# Patient Record
Sex: Male | Born: 1950
Health system: Southern US, Community
[De-identification: ages and names within clinical notes are randomized; demographics above are authoritative.]

## PROBLEM LIST (undated history)

## (undated) HISTORY — PX: OTHER SURGICAL HISTORY: SHX169

## (undated) HISTORY — PX: TRANSURETHRAL RESECTION OF PROSTATE: SHX73

---

## 1997-08-10 ENCOUNTER — Encounter: Admission: RE | Admit: 1997-08-10 | Discharge: 1997-11-08 | Payer: Self-pay | Admitting: Family Medicine

## 2000-10-22 ENCOUNTER — Ambulatory Visit (HOSPITAL_COMMUNITY): Admission: RE | Admit: 2000-10-22 | Discharge: 2000-10-22 | Payer: Self-pay | Admitting: Gastroenterology

## 2003-03-04 ENCOUNTER — Ambulatory Visit (HOSPITAL_COMMUNITY): Admission: RE | Admit: 2003-03-04 | Discharge: 2003-03-04 | Payer: Self-pay | Admitting: Internal Medicine

## 2003-03-04 IMAGING — CT CT ABDOMEN W/ CM
1 of 3 series · 12 of 32 positions shown, 18 images · IV contrast (omnipaque)
Comparison: none

CLINICAL DATA: Liver lesion noted on outside ultrasound.

CT ABDOMEN WITH CONTRAST
TECHNIQUE: Multidetector helical imaging carried out through the abdomen and pelvis utilizing oral and IV contrast (150 cc of Omnipaque 300).  No prior studies for comparison.  There is an outside ultrasound from [facility] for correlation.

[Series 3: abd/pelvis 5.0 b30f · axial · 0.86mm/px · z∈[-412,-152]mm · 12 of 62 slices shown, 18 images]
[im 5/62  soft-tissue]
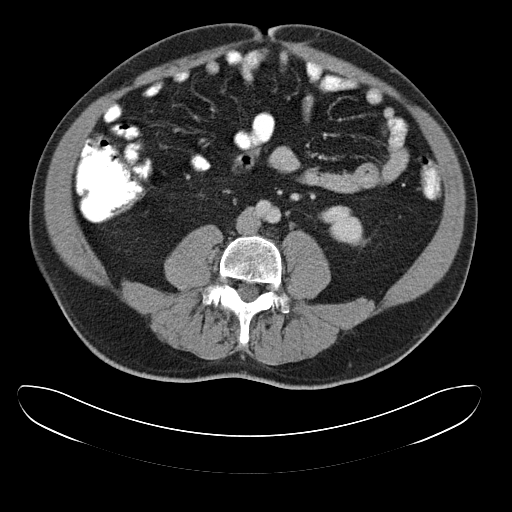
[im 5/62  bone]
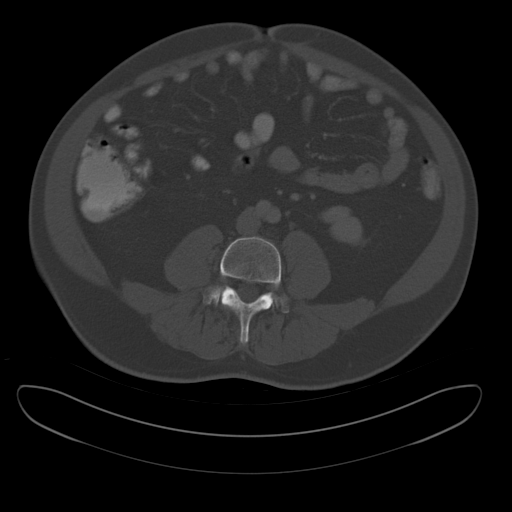
[im 10/62  soft-tissue]
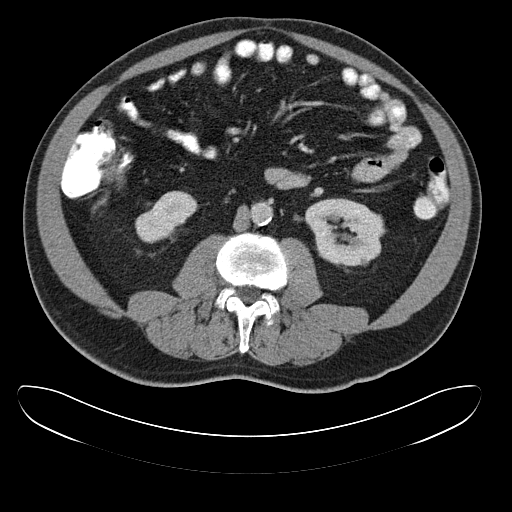
[im 15/62  soft-tissue]
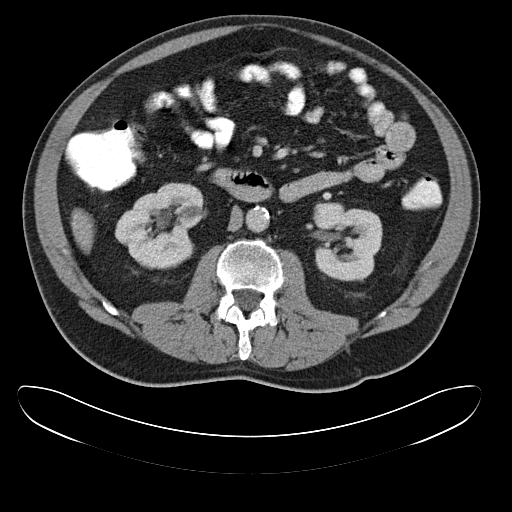
[im 19/62  soft-tissue]
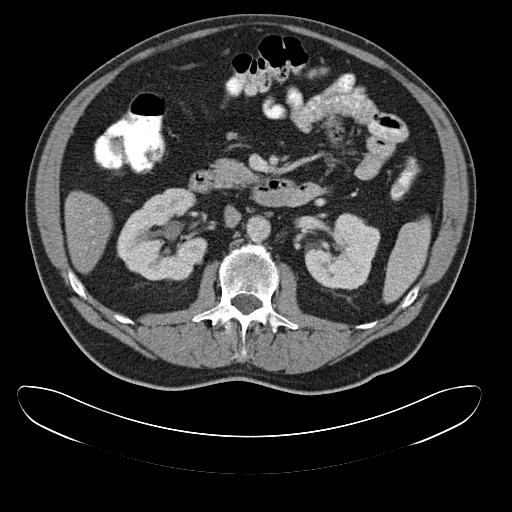
[im 24/62  soft-tissue]
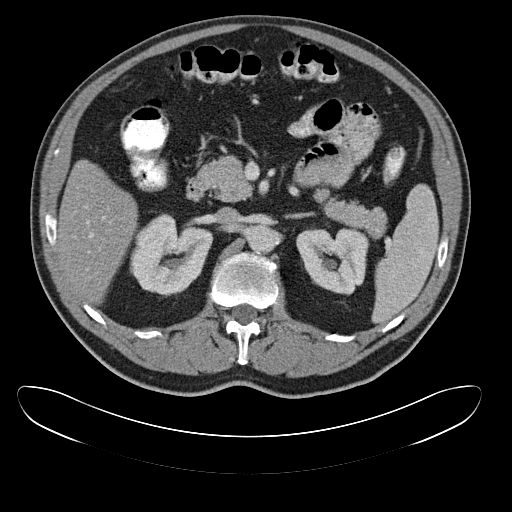
[im 29/62  soft-tissue]
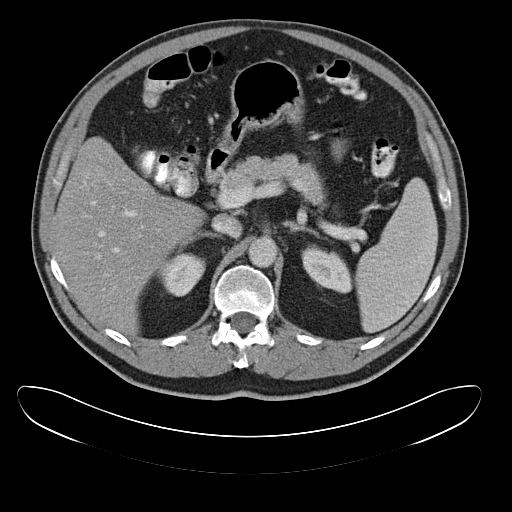
[im 33/62  soft-tissue]
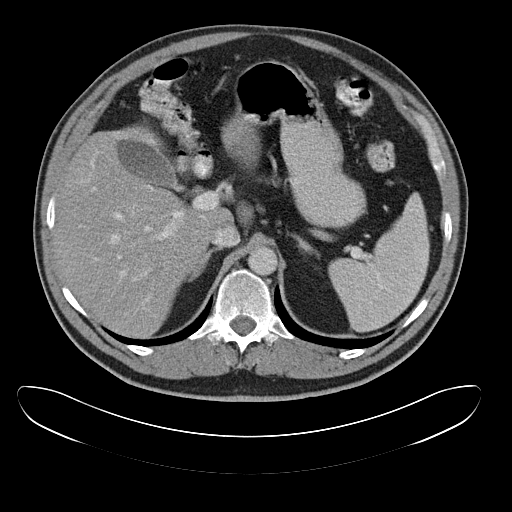
[im 38/62  soft-tissue]
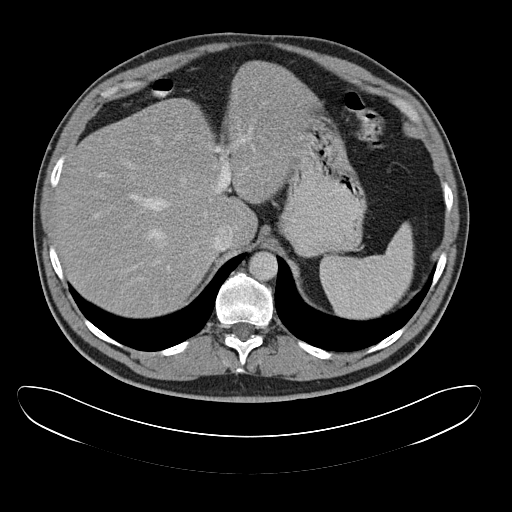
[im 43/62  soft-tissue]
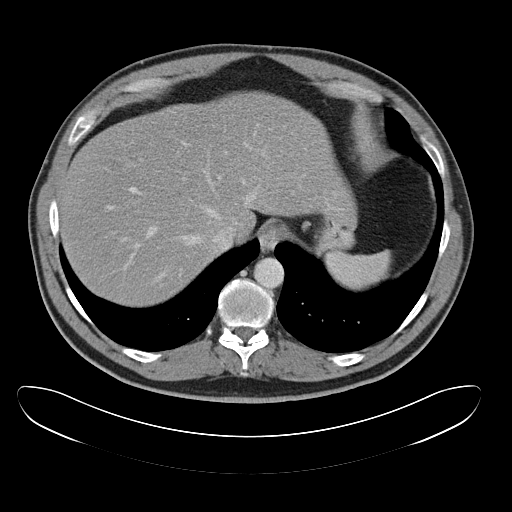
[im 43/62  lung]
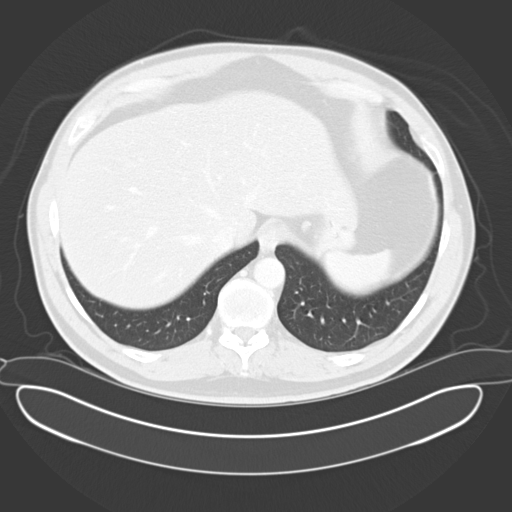
[im 43/62  bone]
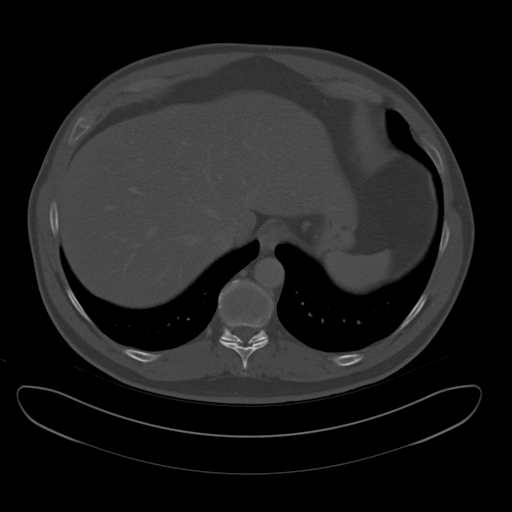
[im 47/62  soft-tissue]
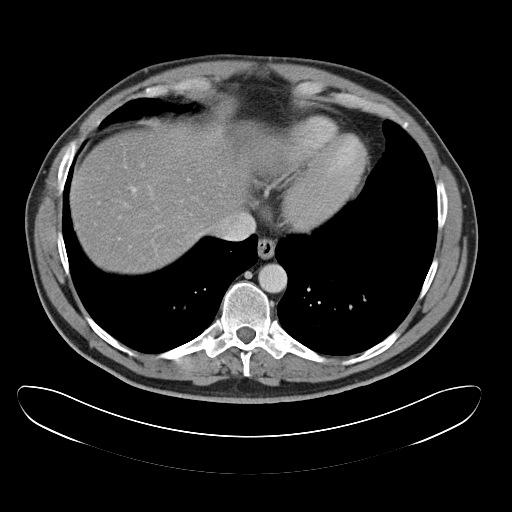
[im 47/62  lung]
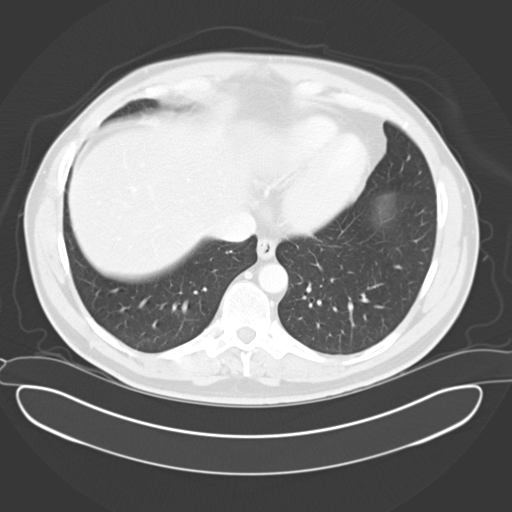
[im 52/62  soft-tissue]
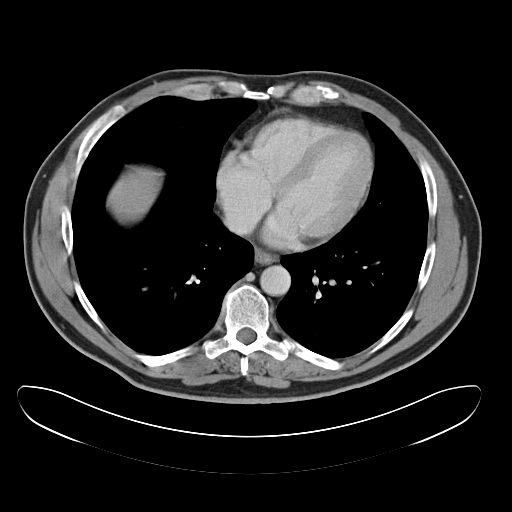
[im 52/62  lung]
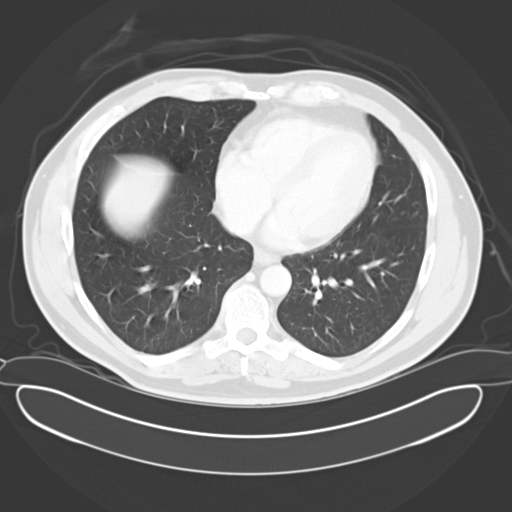
[im 57/62  soft-tissue]
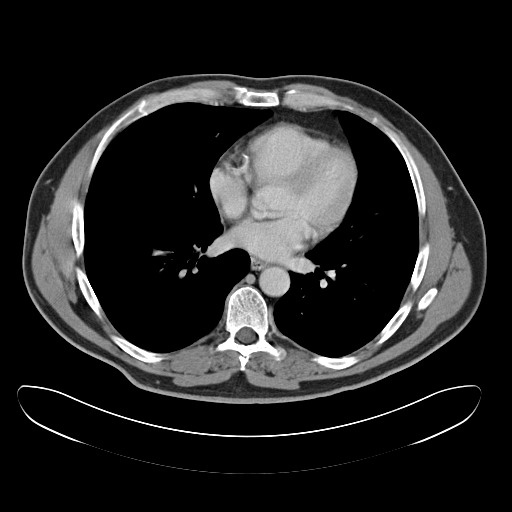
[im 57/62  lung]
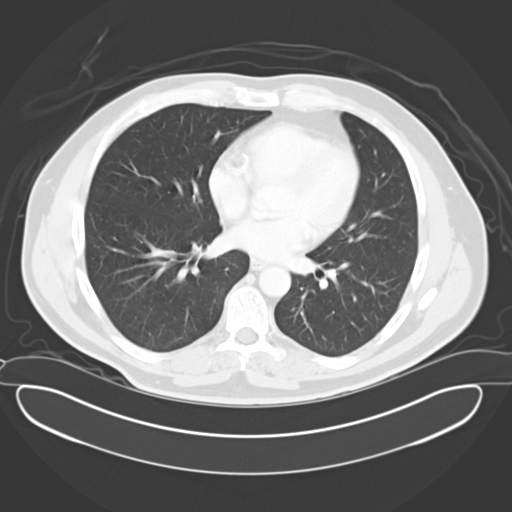

[12 of 32 positions shown; findings below may reference images not displayed]

FINDINGS: The outside ultrasound exam shows what appears to be a hypoechoic lesion in the anterior aspect of the liver.  I see no evidence for a liver lesion on the early or delayed study.  It is possible that the lesion is isointense with liver tissue and cannot be imaged.  This might be the case with a hemangioma that has become isointense.  However, I see no suggestion whatsoever for an abnormality as the ultrasound suggested.  I would therefore question if this lesion was an artifact.  However, the sonographic abnormality cannot be ignored.  One might consider an MRI of the liver for further assessment, if clinically warranted.

The spleen, pancreas, and adrenals are normal.  No calcified gallstones or bile duct dilatation.  There is however, a 6 mm structure in the dependent portion of the tip of the gallbladder that is probably a polyp.  The other possibility is that it is a faintly calcified gallstone.  This is seen on both the early and delayed views so it is a true abnormality.  It was also noted on the outside ultrasound and was thought to represent a polyp.

Early and delayed images of the kidneys are unremarkable except for a small simple cyst of the right mid kidney.  no retroperitoneal adenopathy, ascites, or focal masses.

IMPRESSION
There is no visible abnormality in the anterior aspect of the liver as the outside ultrasound suggests.  See above discussion.
There is a 6 mm faintly calcified or enhancing lesion in the tip of the gallbladder consistent with a polyp or faintly calcified stone.

## 2003-06-02 ENCOUNTER — Encounter
Admission: RE | Admit: 2003-06-02 | Discharge: 2003-06-02 | Payer: Self-pay | Admitting: Physical Medicine and Rehabilitation

## 2003-06-02 IMAGING — CT CT L SPINE W/ CM
2 of 9 series · 11 of 33 positions shown, 14 images · non-contrast
Comparison: none

CLINICAL DATA: Low back pain.  Numbness in left foot.  Occasional back pain.
TECHNIQUE: 1.25 mm collimated images were obtained in the axial plane followed by coronal, sagittal and oblique axial reformatted images through the examined individual disk spaces.  
L1-2:  Normal interspace.
L2-3:  Tiny calcified central protrusion.  No root cut off or spinal stenosis.
L3-4:  Broad-based central disk protrusion which is calcified on the right.  Right L4 nerve root encroachment is seen due to lateral recess stenosis.
L4-5:  Mild facet arthropathy.  Broad-based disk protrusion.  No definite L4 or L5 nerve root encroachment can be seen.  
L5-S1:  Small central protrusion.  No definite root cut off or spinal stenosis. 
IMPRESSION
1.  Broad-based disk protrusion L3-4 central and to the right with right L4 nerve root encroachment.
2.  Small protrusions L4-5 and L5-S1 without definite L5 or S1 nerve root compromise.
3.  Small central protrusion L2-3, left without L3 nerve root compression.
CT MULTIPLANAR RECONSTRUCTION
Multiplanar reformatted CT images were reconstructed from the axial CT data set. These images were reviewed and pertinent findings are included in the accompanying complete CT report. 

See complete CT report.

[Series 3: recon 2: l-spine helical · axial · 0.27mm/px · z∈[-35,+115]mm · 6 of 351 slices shown, 8 images]
[im 51/351  soft-tissue]
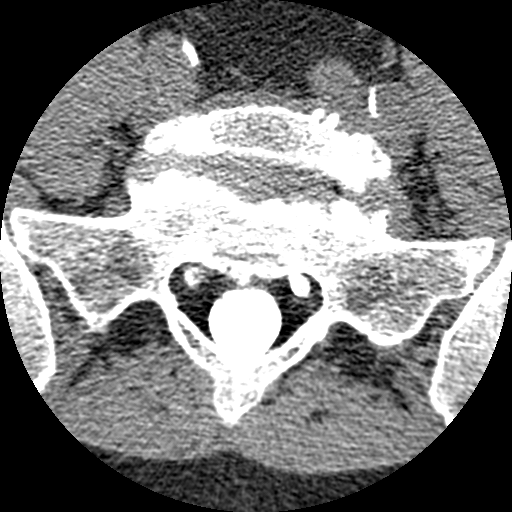
[im 51/351  bone]
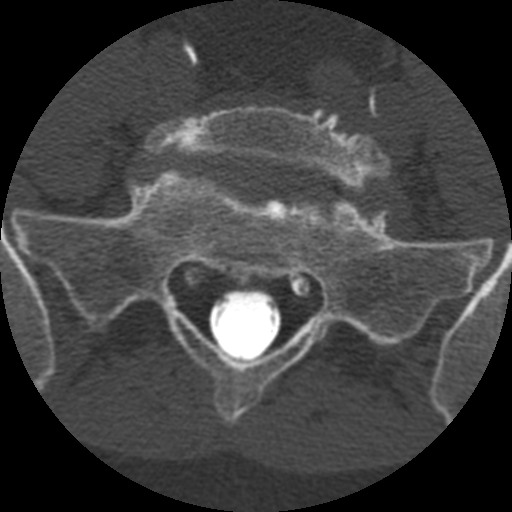
[im 101/351  bone]
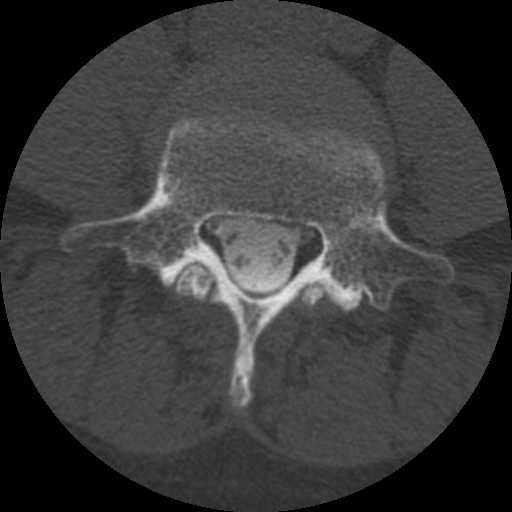
[im 151/351  bone]
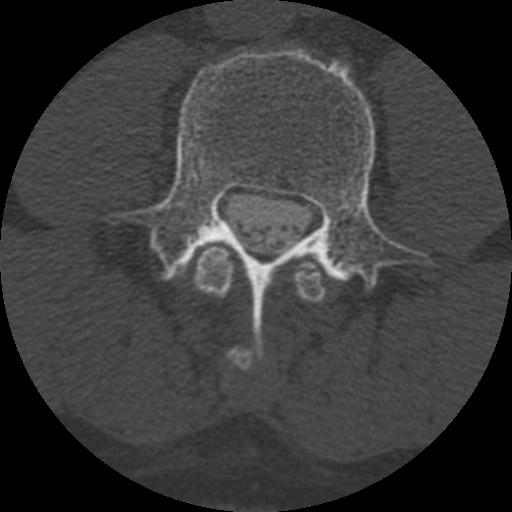
[im 201/351  bone]
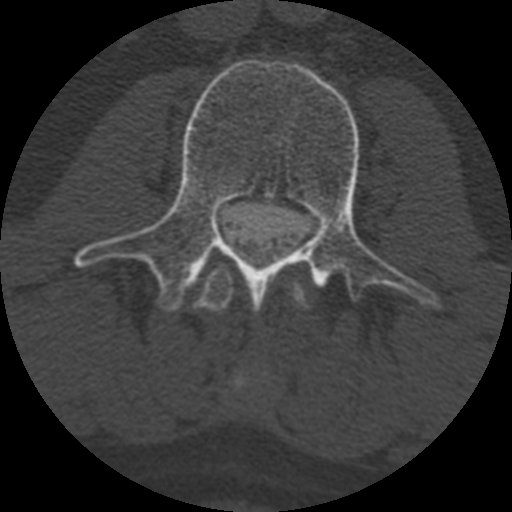
[im 251/351  soft-tissue]
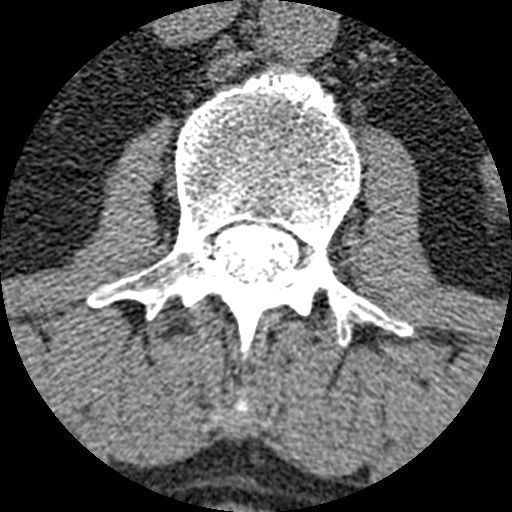
[im 251/351  bone]
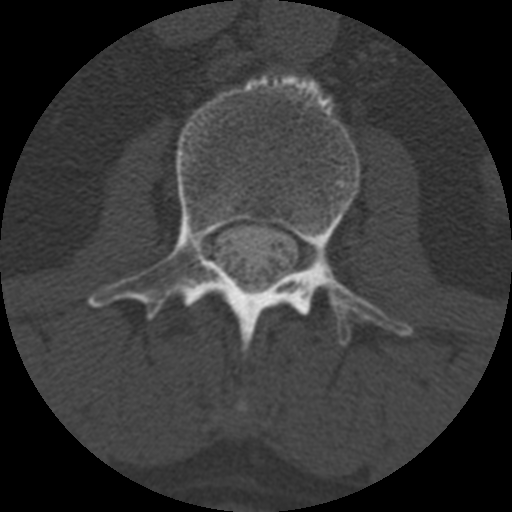
[im 301/351  bone]
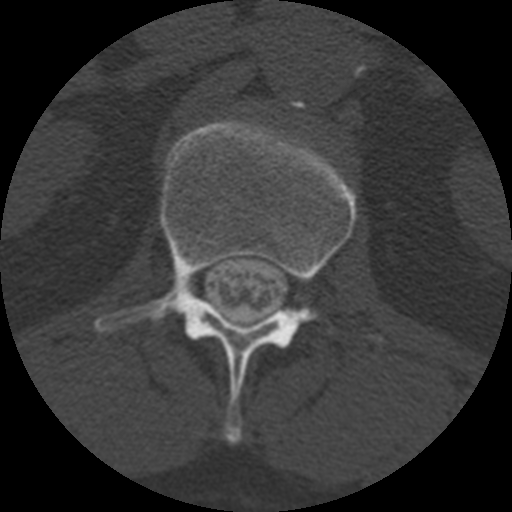

[Series 608: reformatted · coronal · 0.42mm/px · 5 of 40 slices shown, 6 images]
[im 14/40  bone]
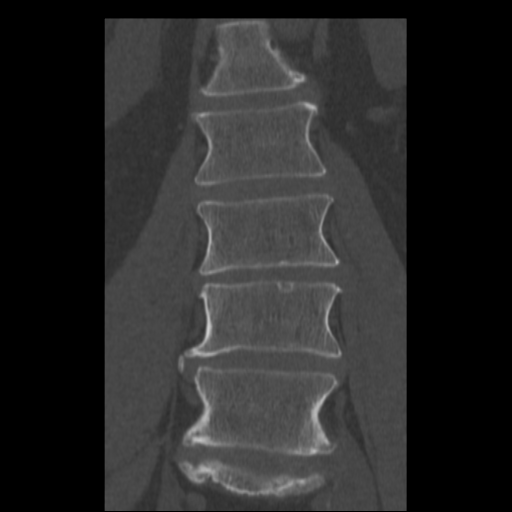
[im 17/40  bone]
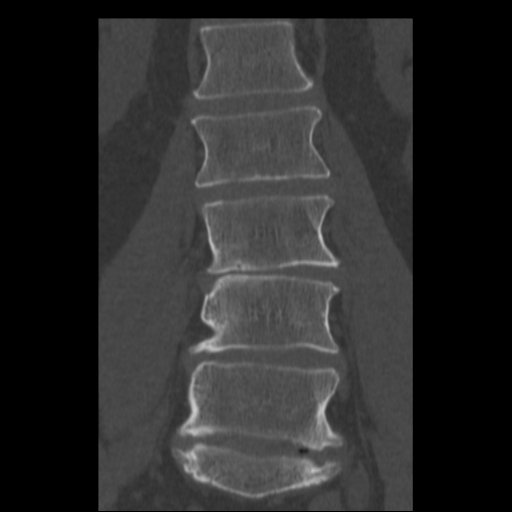
[im 20/40  soft-tissue]
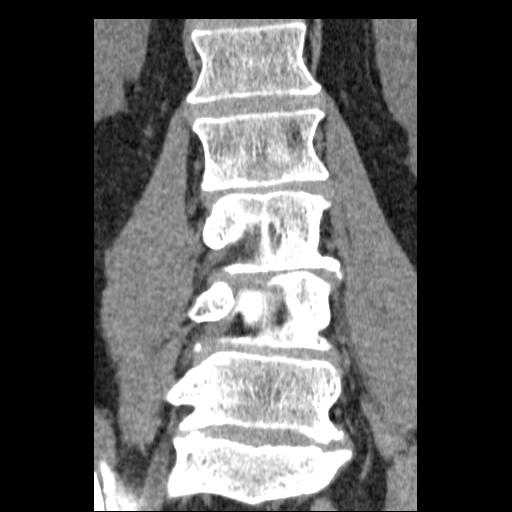
[im 20/40  bone]
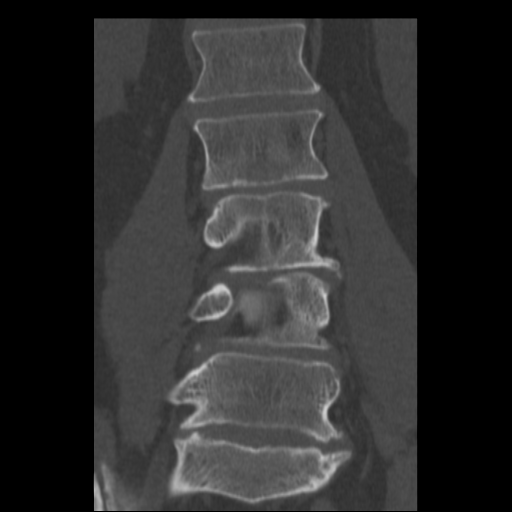
[im 23/40  bone]
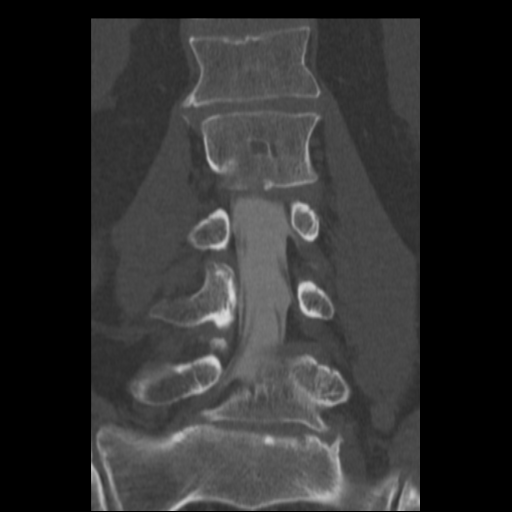
[im 27/40  bone]
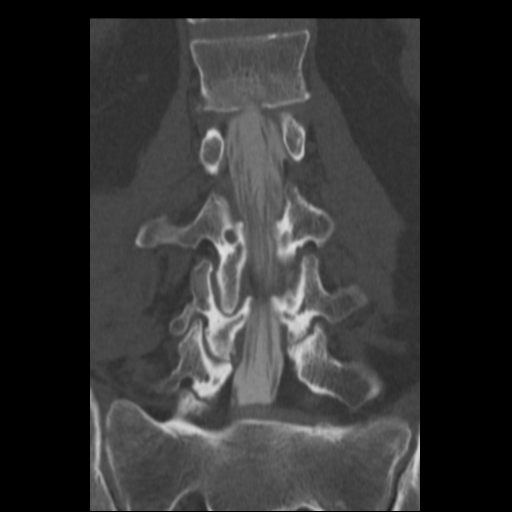

[11 of 33 positions shown; findings below may reference images not displayed]

LUMBAR MYELOGRAM 
The patient was given extensive informed consent including the risk of pain, infection, bleeding, and headache.  A lumbar puncture was performed at L2-3 using a 22 gauge spinal needle from a left paramedian approach.  Fluid was clear and colorless.  15 cc of Omnipaque 180 was instilled into the subarachnoid space.  AP, lateral, and oblique views demonstrate mild disk space narrowing at L4-5.  Minimal retrolisthesis of L3 on L4 is seen of 2 to 3 mm.  Early osteophyte formation is seen inferiorly at L3.  Flexion/extension show good range of motion without abnormal movement.  Small ventral defects are seen at L3-4, L4-5, and L5-S1.  
IMPRESSION
As above. 
POST MYELOGRAM CT OF THE LUMBAR SPINE WITH CONTRAST

## 2008-04-26 ENCOUNTER — Ambulatory Visit (HOSPITAL_COMMUNITY): Admission: RE | Admit: 2008-04-26 | Discharge: 2008-04-26 | Payer: Self-pay | Admitting: Family Medicine

## 2008-04-26 IMAGING — CT CT ABDOMEN W/O CM
2 of 4 series · 17 of 46 positions shown, 19 images · non-contrast
Comparison: Abdomen CT on [DATE]

CT ABDOMEN

CLINICAL DATA: Left-sided flank and back pain.  Hematuria.

CT ABDOMEN AND PELVIS WITHOUT CONTRAST
TECHNIQUE: Multidetector CT imaging of the abdomen and pelvis was
performed following the standard protocol without intravenous
contrast.

[Series 2: stone w/o 5.0 b40f · axial · non-contrast · 0.84mm/px · z∈[-536,-76]mm · 14 of 126 slices shown, 16 images]
[im 6/126  soft-tissue]
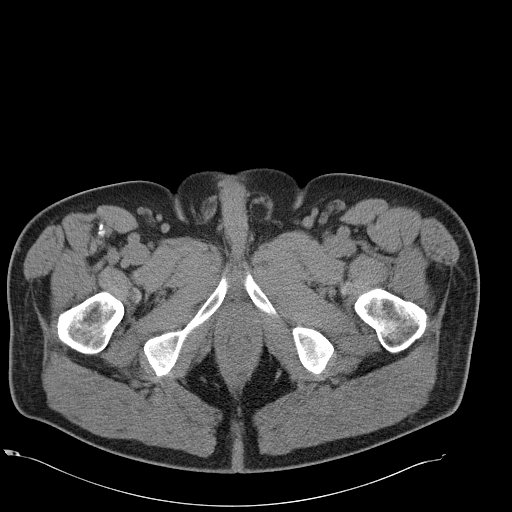
[im 6/126  bone]
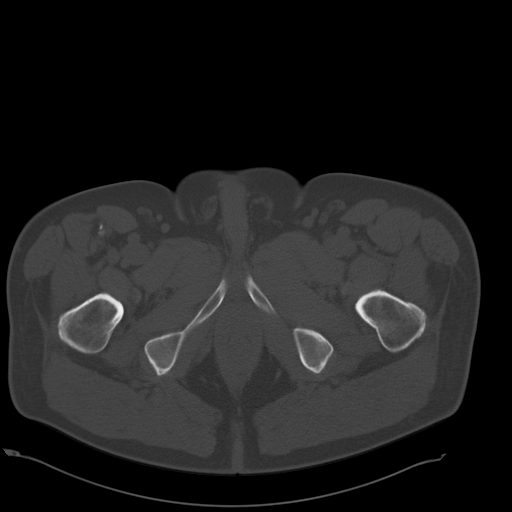
[im 16/126  soft-tissue]
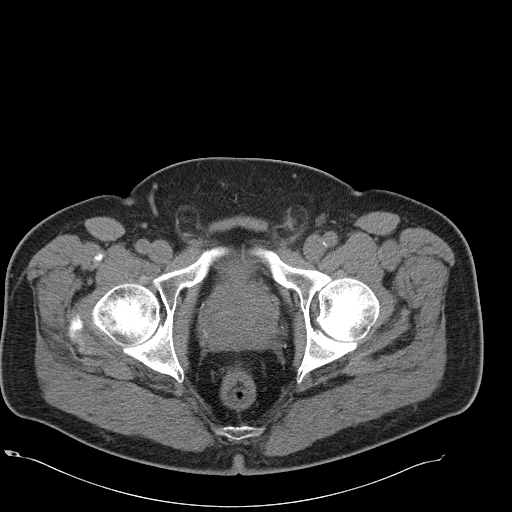
[im 26/126  soft-tissue]
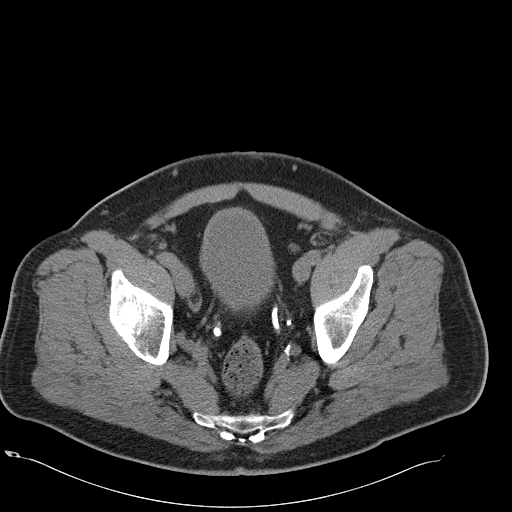
[im 36/126  soft-tissue]
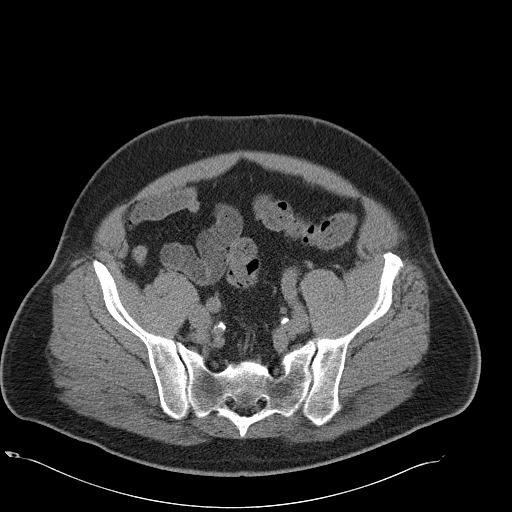
[im 41/126  soft-tissue]
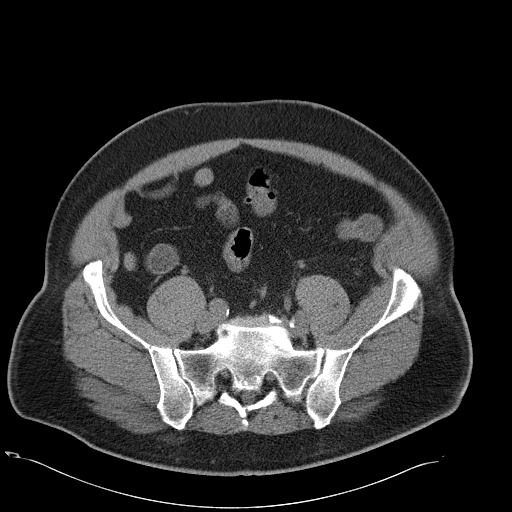
[im 51/126  soft-tissue]
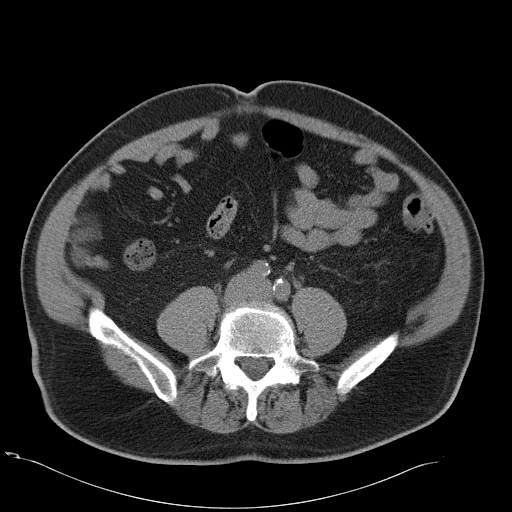
[im 61/126  soft-tissue]
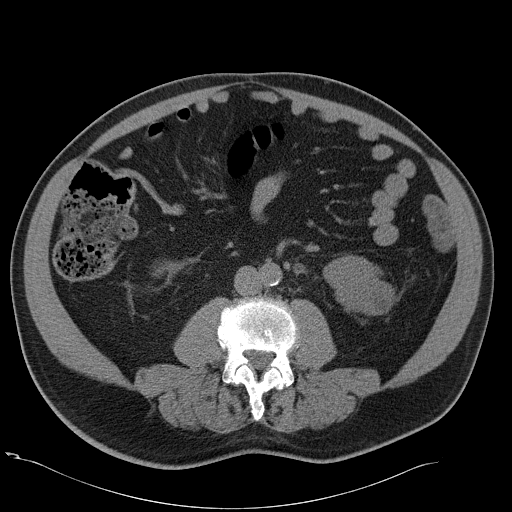
[im 66/126  soft-tissue]
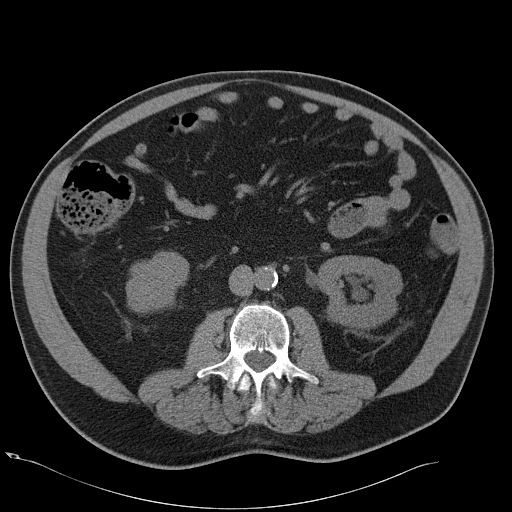
[im 76/126  soft-tissue]
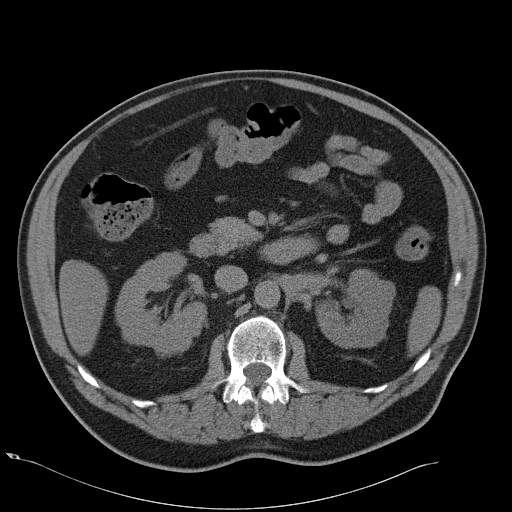
[im 76/126  bone]
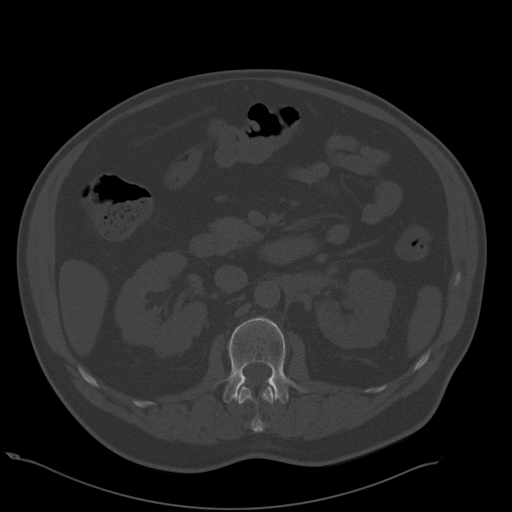
[im 86/126  soft-tissue]
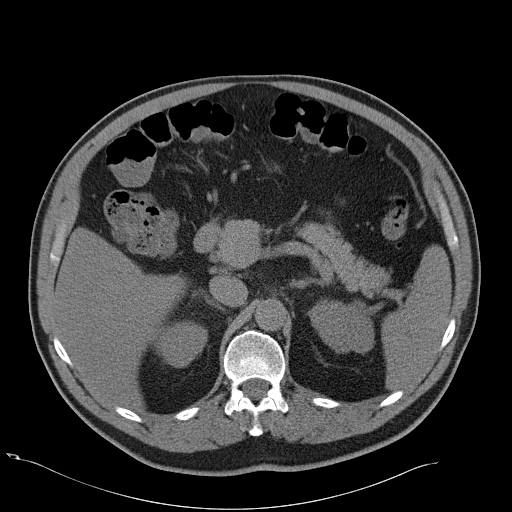
[im 96/126  soft-tissue]
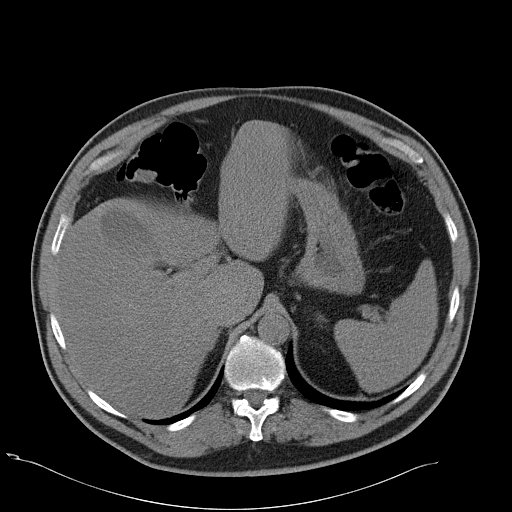
[im 101/126  soft-tissue]
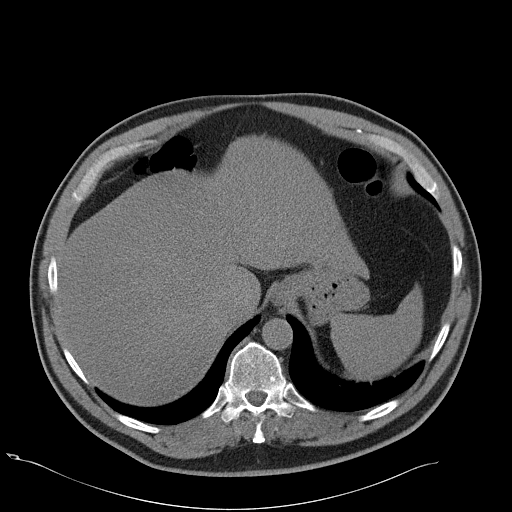
[im 111/126  soft-tissue]
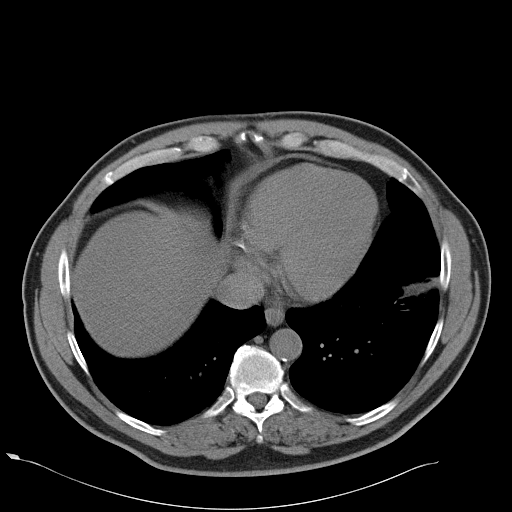
[im 121/126  soft-tissue]
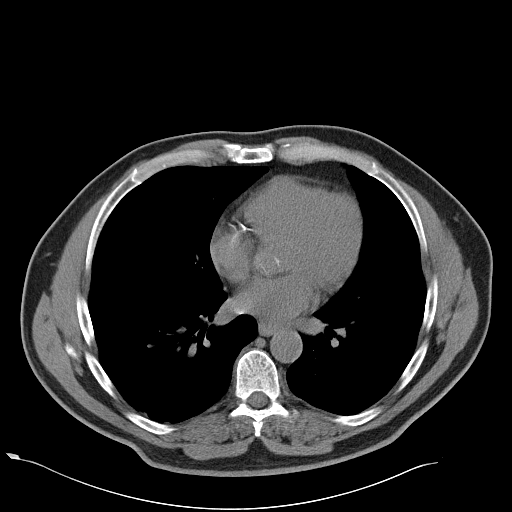

[Series 602: coronal abdomen · coronal · 0.98mm/px · 3 of 157 slices shown]
[im 53/157  soft-tissue]
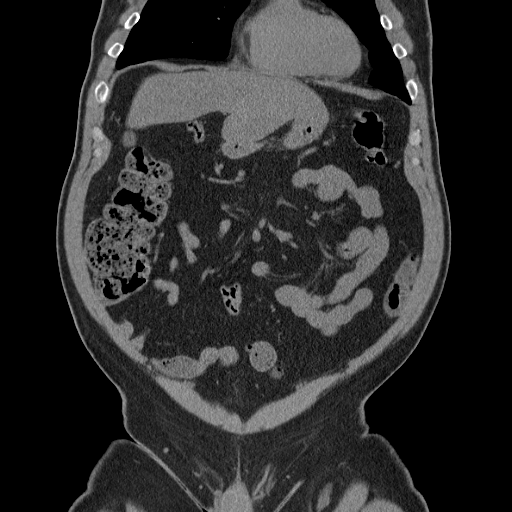
[im 70/157  soft-tissue]
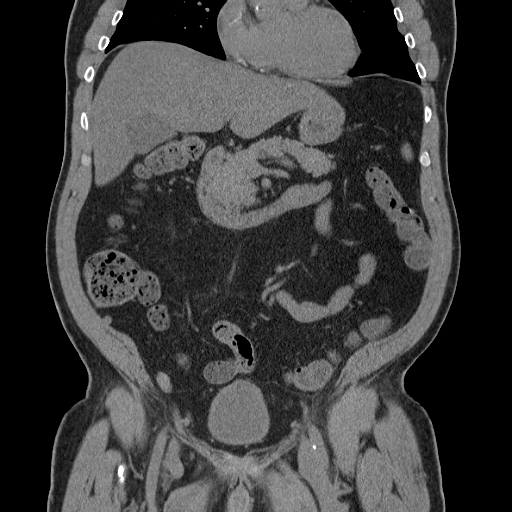
[im 87/157  soft-tissue]
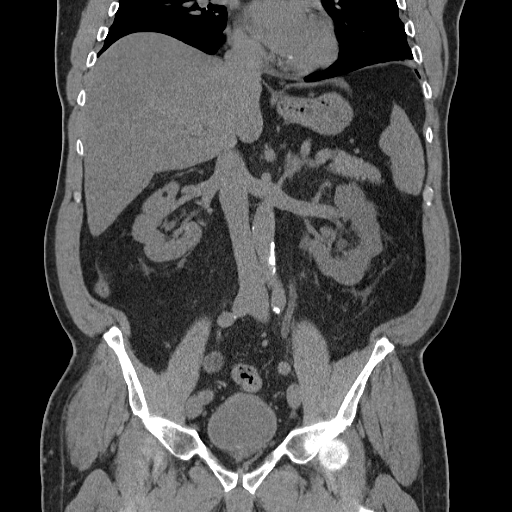

[17 of 46 positions shown; findings below may reference images not displayed]

FINDINGS: Mild left hydronephrosis and ureterectasis is seen.
There is no evidence of right-sided hydronephrosis.  Lobulated
contours of both kidneys is again seen without parenchymal
thinning.  This may be due to persistent fetal lobation, or less
likely scarring.

Diffuse fatty infiltration of liver is again demonstrated.  A high
attenuation lesion is seen in the peripheral right hepatic lobe on
image number 35 which is stable since previous study and [UC] is
consistent with a benign lesion.  Small gallstones are again noted
however there is no evidence of acute cholecystitis.

Noncontrast images of the spleen, pancreas, and adrenal glands are
normal in appearance.  There is no evidence of inflammatory process
or dilated bowel loops.
IMPRESSION: 1.  Mild left hydronephrosis and ureterectasis due to distal left
ureteral calculus.  See pelvis CT report below.
2. Stable diffuse fatty infiltration of liver and benign right
hepatic lobe lesion unchanged since [DATE].  Cholelithiasis.

CT PELVIS
FINDINGS: Mild left ureteral dilatation is seen.  A distal left
ureteral calculus measuring 3 mm is located approximates 2 cm from
the ureterovesicle junction.

Mild to moderate enlargement prostate gland is noted.  No other
pelvic soft tissue masses are identified.  There is no evidence of
pelvic lymphadenopathy.  There is no evidence of inflammatory
process or abnormal fluid collections.
IMPRESSION: 1.  3 mm distal left ureteral calculus.
2.  Enlarged prostate.

## 2010-05-21 ENCOUNTER — Encounter: Payer: Self-pay | Admitting: Neurosurgery

## 2010-09-16 NOTE — Procedures (Signed)
[facility]  Patient:    Miguel [name], Miguel [name]                        MRN: [id number] Proc. Date: 10/22/00 Attending:  Everardo All. Madilyn Fireman, M.D. CC:         Chales Salmon. Abigail Miyamoto, M.D.   Procedure Report  PROCEDURE:  Colonoscopy.  INDICATION FOR PROCEDURE:  Intermittent rectal bleeding in a 60 year old patient with no prior colon screening.  DESCRIPTION OF PROCEDURE:  The patient was placed in the left lateral decubitus position and placed on the pulse monitor with continuous low-flow oxygen delivered by nasal cannula.  He was sedated with 80 mg IV Demerol and 8 mg IV Versed.  The Olympus video colonoscope was inserted into the rectum and advanced to the cecum, confirmed by transillumination at McBurneys point and visualization of the ileocecal valve and appendiceal orifice.  The prep was fairly good but suboptimal in some areas, and I could not rule out small lesions less than 1 cm in all areas.  Otherwise, the cecum, ascending, transverse, descending, and sigmoid colon all appeared normal with no masses, polyps, diverticula, or other mucosal abnormalities.  The rectum likewise appeared normal, and retroflex view of the anus revealed some small internal hemorrhoids.  The colonoscope was then withdrawn and the patient returned to the recovery room in stable condition.  He tolerated the procedure well, and there were no immediate complications.  IMPRESSION:  Internal hemorrhoids, otherwise normal colonoscopy.  PLAN:  Symptomatic treatment of hemorrhoids.  Recommend repeat flexible sigmoidoscopy in five years. DD:  10/22/00 TD:  10/22/00 Job: [id number]

## 2015-07-19 DIAGNOSIS — M75111 Incomplete rotator cuff tear or rupture of right shoulder, not specified as traumatic: Secondary | ICD-10-CM | POA: Diagnosis not present

## 2015-08-20 DIAGNOSIS — M13862 Other specified arthritis, left knee: Secondary | ICD-10-CM | POA: Diagnosis not present

## 2015-09-07 DIAGNOSIS — M25662 Stiffness of left knee, not elsewhere classified: Secondary | ICD-10-CM | POA: Diagnosis not present

## 2015-09-07 DIAGNOSIS — M6281 Muscle weakness (generalized): Secondary | ICD-10-CM | POA: Diagnosis not present

## 2015-09-07 DIAGNOSIS — M1712 Unilateral primary osteoarthritis, left knee: Secondary | ICD-10-CM | POA: Diagnosis not present

## 2015-09-10 DIAGNOSIS — N521 Erectile dysfunction due to diseases classified elsewhere: Secondary | ICD-10-CM | POA: Diagnosis not present

## 2015-09-10 DIAGNOSIS — E291 Testicular hypofunction: Secondary | ICD-10-CM | POA: Diagnosis not present

## 2015-09-10 DIAGNOSIS — N390 Urinary tract infection, site not specified: Secondary | ICD-10-CM | POA: Diagnosis not present

## 2015-09-10 DIAGNOSIS — N401 Enlarged prostate with lower urinary tract symptoms: Secondary | ICD-10-CM | POA: Diagnosis not present

## 2015-09-13 DIAGNOSIS — M25662 Stiffness of left knee, not elsewhere classified: Secondary | ICD-10-CM | POA: Diagnosis not present

## 2015-09-13 DIAGNOSIS — M6281 Muscle weakness (generalized): Secondary | ICD-10-CM | POA: Diagnosis not present

## 2015-09-13 DIAGNOSIS — M1712 Unilateral primary osteoarthritis, left knee: Secondary | ICD-10-CM | POA: Diagnosis not present

## 2015-09-17 DIAGNOSIS — M13862 Other specified arthritis, left knee: Secondary | ICD-10-CM | POA: Diagnosis not present

## 2015-09-22 DIAGNOSIS — M1712 Unilateral primary osteoarthritis, left knee: Secondary | ICD-10-CM | POA: Diagnosis not present

## 2015-09-22 DIAGNOSIS — M6281 Muscle weakness (generalized): Secondary | ICD-10-CM | POA: Diagnosis not present

## 2015-09-22 DIAGNOSIS — M25662 Stiffness of left knee, not elsewhere classified: Secondary | ICD-10-CM | POA: Diagnosis not present

## 2015-10-04 DIAGNOSIS — X58XXXA Exposure to other specified factors, initial encounter: Secondary | ICD-10-CM | POA: Diagnosis not present

## 2015-10-04 DIAGNOSIS — S63502A Unspecified sprain of left wrist, initial encounter: Secondary | ICD-10-CM | POA: Diagnosis not present

## 2015-10-12 DIAGNOSIS — M6281 Muscle weakness (generalized): Secondary | ICD-10-CM | POA: Diagnosis not present

## 2015-10-12 DIAGNOSIS — M1712 Unilateral primary osteoarthritis, left knee: Secondary | ICD-10-CM | POA: Diagnosis not present

## 2015-10-12 DIAGNOSIS — M25662 Stiffness of left knee, not elsewhere classified: Secondary | ICD-10-CM | POA: Diagnosis not present

## 2015-10-20 DIAGNOSIS — M1712 Unilateral primary osteoarthritis, left knee: Secondary | ICD-10-CM | POA: Diagnosis not present

## 2015-10-20 DIAGNOSIS — M6281 Muscle weakness (generalized): Secondary | ICD-10-CM | POA: Diagnosis not present

## 2015-10-20 DIAGNOSIS — M25662 Stiffness of left knee, not elsewhere classified: Secondary | ICD-10-CM | POA: Diagnosis not present

## 2015-10-22 DIAGNOSIS — M25532 Pain in left wrist: Secondary | ICD-10-CM | POA: Diagnosis not present

## 2015-10-22 DIAGNOSIS — M13862 Other specified arthritis, left knee: Secondary | ICD-10-CM | POA: Diagnosis not present

## 2016-01-05 DIAGNOSIS — M25511 Pain in right shoulder: Secondary | ICD-10-CM | POA: Diagnosis not present

## 2016-01-05 DIAGNOSIS — B351 Tinea unguium: Secondary | ICD-10-CM | POA: Diagnosis not present

## 2016-02-04 DIAGNOSIS — M170 Bilateral primary osteoarthritis of knee: Secondary | ICD-10-CM | POA: Diagnosis not present

## 2016-02-04 DIAGNOSIS — N400 Enlarged prostate without lower urinary tract symptoms: Secondary | ICD-10-CM | POA: Diagnosis not present

## 2016-02-04 DIAGNOSIS — M109 Gout, unspecified: Secondary | ICD-10-CM | POA: Diagnosis not present

## 2016-02-04 DIAGNOSIS — I10 Essential (primary) hypertension: Secondary | ICD-10-CM | POA: Diagnosis not present

## 2016-02-04 DIAGNOSIS — E7800 Pure hypercholesterolemia, unspecified: Secondary | ICD-10-CM | POA: Diagnosis not present

## 2016-02-04 DIAGNOSIS — G629 Polyneuropathy, unspecified: Secondary | ICD-10-CM | POA: Diagnosis not present

## 2016-02-07 DIAGNOSIS — M1712 Unilateral primary osteoarthritis, left knee: Secondary | ICD-10-CM | POA: Diagnosis not present

## 2016-02-07 DIAGNOSIS — M170 Bilateral primary osteoarthritis of knee: Secondary | ICD-10-CM | POA: Diagnosis not present

## 2016-02-07 DIAGNOSIS — M25561 Pain in right knee: Secondary | ICD-10-CM | POA: Diagnosis not present

## 2016-02-07 DIAGNOSIS — M25562 Pain in left knee: Secondary | ICD-10-CM | POA: Diagnosis not present

## 2016-02-14 DIAGNOSIS — M1712 Unilateral primary osteoarthritis, left knee: Secondary | ICD-10-CM | POA: Diagnosis not present

## 2016-02-14 DIAGNOSIS — M170 Bilateral primary osteoarthritis of knee: Secondary | ICD-10-CM | POA: Diagnosis not present

## 2016-02-14 DIAGNOSIS — M25562 Pain in left knee: Secondary | ICD-10-CM | POA: Diagnosis not present

## 2016-02-22 DIAGNOSIS — M1712 Unilateral primary osteoarthritis, left knee: Secondary | ICD-10-CM | POA: Diagnosis not present

## 2016-02-22 DIAGNOSIS — M25562 Pain in left knee: Secondary | ICD-10-CM | POA: Diagnosis not present

## 2016-02-29 DIAGNOSIS — M1712 Unilateral primary osteoarthritis, left knee: Secondary | ICD-10-CM | POA: Diagnosis not present

## 2016-02-29 DIAGNOSIS — M25562 Pain in left knee: Secondary | ICD-10-CM | POA: Diagnosis not present

## 2016-03-07 DIAGNOSIS — M1712 Unilateral primary osteoarthritis, left knee: Secondary | ICD-10-CM | POA: Diagnosis not present

## 2016-03-07 DIAGNOSIS — M25562 Pain in left knee: Secondary | ICD-10-CM | POA: Diagnosis not present

## 2016-04-04 DIAGNOSIS — M1712 Unilateral primary osteoarthritis, left knee: Secondary | ICD-10-CM | POA: Diagnosis not present

## 2016-04-04 DIAGNOSIS — M25562 Pain in left knee: Secondary | ICD-10-CM | POA: Diagnosis not present

## 2016-05-25 DIAGNOSIS — M25562 Pain in left knee: Secondary | ICD-10-CM | POA: Diagnosis not present

## 2016-05-25 DIAGNOSIS — M1712 Unilateral primary osteoarthritis, left knee: Secondary | ICD-10-CM | POA: Diagnosis not present

## 2016-05-30 DIAGNOSIS — I10 Essential (primary) hypertension: Secondary | ICD-10-CM | POA: Diagnosis not present

## 2016-07-25 DIAGNOSIS — Z125 Encounter for screening for malignant neoplasm of prostate: Secondary | ICD-10-CM | POA: Diagnosis not present

## 2016-07-25 DIAGNOSIS — Z0000 Encounter for general adult medical examination without abnormal findings: Secondary | ICD-10-CM | POA: Diagnosis not present

## 2016-07-25 DIAGNOSIS — N400 Enlarged prostate without lower urinary tract symptoms: Secondary | ICD-10-CM | POA: Diagnosis not present

## 2016-07-25 DIAGNOSIS — Z1159 Encounter for screening for other viral diseases: Secondary | ICD-10-CM | POA: Diagnosis not present

## 2016-07-25 DIAGNOSIS — E669 Obesity, unspecified: Secondary | ICD-10-CM | POA: Diagnosis not present

## 2016-07-25 DIAGNOSIS — Z23 Encounter for immunization: Secondary | ICD-10-CM | POA: Diagnosis not present

## 2016-07-25 DIAGNOSIS — Z6834 Body mass index (BMI) 34.0-34.9, adult: Secondary | ICD-10-CM | POA: Diagnosis not present

## 2016-07-31 DIAGNOSIS — M1712 Unilateral primary osteoarthritis, left knee: Secondary | ICD-10-CM | POA: Diagnosis not present

## 2016-07-31 DIAGNOSIS — M25462 Effusion, left knee: Secondary | ICD-10-CM | POA: Diagnosis not present

## 2016-07-31 DIAGNOSIS — M25562 Pain in left knee: Secondary | ICD-10-CM | POA: Diagnosis not present

## 2016-09-11 DIAGNOSIS — Z789 Other specified health status: Secondary | ICD-10-CM | POA: Diagnosis not present

## 2016-09-11 DIAGNOSIS — M25562 Pain in left knee: Secondary | ICD-10-CM | POA: Diagnosis not present

## 2016-09-11 DIAGNOSIS — M1712 Unilateral primary osteoarthritis, left knee: Secondary | ICD-10-CM | POA: Diagnosis not present

## 2016-09-11 DIAGNOSIS — M25462 Effusion, left knee: Secondary | ICD-10-CM | POA: Diagnosis not present

## 2016-09-18 DIAGNOSIS — Z9229 Personal history of other drug therapy: Secondary | ICD-10-CM | POA: Diagnosis not present

## 2016-09-18 DIAGNOSIS — M25562 Pain in left knee: Secondary | ICD-10-CM | POA: Diagnosis not present

## 2016-09-18 DIAGNOSIS — M1712 Unilateral primary osteoarthritis, left knee: Secondary | ICD-10-CM | POA: Diagnosis not present

## 2016-09-18 DIAGNOSIS — Z789 Other specified health status: Secondary | ICD-10-CM | POA: Diagnosis not present

## 2016-09-21 DIAGNOSIS — M79675 Pain in left toe(s): Secondary | ICD-10-CM | POA: Diagnosis not present

## 2016-09-21 DIAGNOSIS — L602 Onychogryphosis: Secondary | ICD-10-CM | POA: Diagnosis not present

## 2016-09-21 DIAGNOSIS — M79674 Pain in right toe(s): Secondary | ICD-10-CM | POA: Diagnosis not present

## 2016-09-21 DIAGNOSIS — B351 Tinea unguium: Secondary | ICD-10-CM | POA: Diagnosis not present

## 2016-09-21 DIAGNOSIS — M2042 Other hammer toe(s) (acquired), left foot: Secondary | ICD-10-CM | POA: Diagnosis not present

## 2016-09-21 DIAGNOSIS — R262 Difficulty in walking, not elsewhere classified: Secondary | ICD-10-CM | POA: Diagnosis not present

## 2016-09-21 DIAGNOSIS — M2041 Other hammer toe(s) (acquired), right foot: Secondary | ICD-10-CM | POA: Diagnosis not present

## 2016-10-05 DIAGNOSIS — B351 Tinea unguium: Secondary | ICD-10-CM | POA: Diagnosis not present

## 2016-10-23 DIAGNOSIS — R1031 Right lower quadrant pain: Secondary | ICD-10-CM | POA: Diagnosis not present

## 2016-12-18 DIAGNOSIS — M1712 Unilateral primary osteoarthritis, left knee: Secondary | ICD-10-CM | POA: Diagnosis not present

## 2016-12-18 DIAGNOSIS — M25562 Pain in left knee: Secondary | ICD-10-CM | POA: Diagnosis not present

## 2016-12-18 DIAGNOSIS — R262 Difficulty in walking, not elsewhere classified: Secondary | ICD-10-CM | POA: Diagnosis not present

## 2016-12-20 DIAGNOSIS — S83105A Unspecified dislocation of left knee, initial encounter: Secondary | ICD-10-CM | POA: Diagnosis not present

## 2016-12-20 DIAGNOSIS — R531 Weakness: Secondary | ICD-10-CM | POA: Diagnosis not present

## 2016-12-26 DIAGNOSIS — R531 Weakness: Secondary | ICD-10-CM | POA: Diagnosis not present

## 2016-12-26 DIAGNOSIS — S83105A Unspecified dislocation of left knee, initial encounter: Secondary | ICD-10-CM | POA: Diagnosis not present

## 2016-12-28 DIAGNOSIS — S83105A Unspecified dislocation of left knee, initial encounter: Secondary | ICD-10-CM | POA: Diagnosis not present

## 2016-12-28 DIAGNOSIS — R531 Weakness: Secondary | ICD-10-CM | POA: Diagnosis not present

## 2017-01-03 DIAGNOSIS — M2352 Chronic instability of knee, left knee: Secondary | ICD-10-CM | POA: Diagnosis not present

## 2017-01-03 DIAGNOSIS — M6281 Muscle weakness (generalized): Secondary | ICD-10-CM | POA: Diagnosis not present

## 2017-01-03 DIAGNOSIS — S83105D Unspecified dislocation of left knee, subsequent encounter: Secondary | ICD-10-CM | POA: Diagnosis not present

## 2017-01-08 DIAGNOSIS — M2352 Chronic instability of knee, left knee: Secondary | ICD-10-CM | POA: Diagnosis not present

## 2017-01-08 DIAGNOSIS — S83105D Unspecified dislocation of left knee, subsequent encounter: Secondary | ICD-10-CM | POA: Diagnosis not present

## 2017-01-08 DIAGNOSIS — M6281 Muscle weakness (generalized): Secondary | ICD-10-CM | POA: Diagnosis not present

## 2017-01-10 DIAGNOSIS — S83105D Unspecified dislocation of left knee, subsequent encounter: Secondary | ICD-10-CM | POA: Diagnosis not present

## 2017-01-10 DIAGNOSIS — M2352 Chronic instability of knee, left knee: Secondary | ICD-10-CM | POA: Diagnosis not present

## 2017-01-10 DIAGNOSIS — M6281 Muscle weakness (generalized): Secondary | ICD-10-CM | POA: Diagnosis not present

## 2017-01-15 DIAGNOSIS — M2352 Chronic instability of knee, left knee: Secondary | ICD-10-CM | POA: Diagnosis not present

## 2017-01-15 DIAGNOSIS — S83105D Unspecified dislocation of left knee, subsequent encounter: Secondary | ICD-10-CM | POA: Diagnosis not present

## 2017-01-15 DIAGNOSIS — M6281 Muscle weakness (generalized): Secondary | ICD-10-CM | POA: Diagnosis not present

## 2017-01-17 DIAGNOSIS — M2352 Chronic instability of knee, left knee: Secondary | ICD-10-CM | POA: Diagnosis not present

## 2017-01-17 DIAGNOSIS — M6281 Muscle weakness (generalized): Secondary | ICD-10-CM | POA: Diagnosis not present

## 2017-01-17 DIAGNOSIS — S83105D Unspecified dislocation of left knee, subsequent encounter: Secondary | ICD-10-CM | POA: Diagnosis not present

## 2017-01-22 DIAGNOSIS — M6281 Muscle weakness (generalized): Secondary | ICD-10-CM | POA: Diagnosis not present

## 2017-01-22 DIAGNOSIS — M2352 Chronic instability of knee, left knee: Secondary | ICD-10-CM | POA: Diagnosis not present

## 2017-01-22 DIAGNOSIS — S83105D Unspecified dislocation of left knee, subsequent encounter: Secondary | ICD-10-CM | POA: Diagnosis not present

## 2017-01-24 DIAGNOSIS — M6281 Muscle weakness (generalized): Secondary | ICD-10-CM | POA: Diagnosis not present

## 2017-01-24 DIAGNOSIS — M2352 Chronic instability of knee, left knee: Secondary | ICD-10-CM | POA: Diagnosis not present

## 2017-01-24 DIAGNOSIS — S83105D Unspecified dislocation of left knee, subsequent encounter: Secondary | ICD-10-CM | POA: Diagnosis not present

## 2017-01-30 DIAGNOSIS — S83105D Unspecified dislocation of left knee, subsequent encounter: Secondary | ICD-10-CM | POA: Diagnosis not present

## 2017-02-01 DIAGNOSIS — E7800 Pure hypercholesterolemia, unspecified: Secondary | ICD-10-CM | POA: Diagnosis not present

## 2017-02-01 DIAGNOSIS — M170 Bilateral primary osteoarthritis of knee: Secondary | ICD-10-CM | POA: Diagnosis not present

## 2017-02-01 DIAGNOSIS — G629 Polyneuropathy, unspecified: Secondary | ICD-10-CM | POA: Diagnosis not present

## 2017-02-01 DIAGNOSIS — N400 Enlarged prostate without lower urinary tract symptoms: Secondary | ICD-10-CM | POA: Diagnosis not present

## 2017-02-01 DIAGNOSIS — M109 Gout, unspecified: Secondary | ICD-10-CM | POA: Diagnosis not present

## 2017-02-01 DIAGNOSIS — I10 Essential (primary) hypertension: Secondary | ICD-10-CM | POA: Diagnosis not present

## 2017-02-06 DIAGNOSIS — S83105D Unspecified dislocation of left knee, subsequent encounter: Secondary | ICD-10-CM | POA: Diagnosis not present

## 2017-02-08 DIAGNOSIS — S83105D Unspecified dislocation of left knee, subsequent encounter: Secondary | ICD-10-CM | POA: Diagnosis not present

## 2017-02-13 DIAGNOSIS — S83105D Unspecified dislocation of left knee, subsequent encounter: Secondary | ICD-10-CM | POA: Diagnosis not present

## 2017-07-26 DIAGNOSIS — I10 Essential (primary) hypertension: Secondary | ICD-10-CM | POA: Diagnosis not present

## 2017-07-26 DIAGNOSIS — N400 Enlarged prostate without lower urinary tract symptoms: Secondary | ICD-10-CM | POA: Diagnosis not present

## 2017-07-26 DIAGNOSIS — Z0000 Encounter for general adult medical examination without abnormal findings: Secondary | ICD-10-CM | POA: Diagnosis not present

## 2017-07-26 DIAGNOSIS — E669 Obesity, unspecified: Secondary | ICD-10-CM | POA: Diagnosis not present

## 2017-07-26 DIAGNOSIS — M170 Bilateral primary osteoarthritis of knee: Secondary | ICD-10-CM | POA: Diagnosis not present

## 2017-07-26 DIAGNOSIS — E7800 Pure hypercholesterolemia, unspecified: Secondary | ICD-10-CM | POA: Diagnosis not present

## 2017-07-26 DIAGNOSIS — Z23 Encounter for immunization: Secondary | ICD-10-CM | POA: Diagnosis not present

## 2017-07-26 DIAGNOSIS — Z125 Encounter for screening for malignant neoplasm of prostate: Secondary | ICD-10-CM | POA: Diagnosis not present

## 2017-09-10 DIAGNOSIS — M25562 Pain in left knee: Secondary | ICD-10-CM | POA: Diagnosis not present

## 2017-09-12 DIAGNOSIS — M25562 Pain in left knee: Secondary | ICD-10-CM | POA: Diagnosis not present

## 2017-09-17 DIAGNOSIS — M25562 Pain in left knee: Secondary | ICD-10-CM | POA: Diagnosis not present

## 2017-09-19 DIAGNOSIS — M25562 Pain in left knee: Secondary | ICD-10-CM | POA: Diagnosis not present

## 2017-09-26 DIAGNOSIS — M25562 Pain in left knee: Secondary | ICD-10-CM | POA: Diagnosis not present

## 2017-09-28 DIAGNOSIS — M25562 Pain in left knee: Secondary | ICD-10-CM | POA: Diagnosis not present

## 2017-10-01 DIAGNOSIS — M25562 Pain in left knee: Secondary | ICD-10-CM | POA: Diagnosis not present

## 2017-10-03 DIAGNOSIS — M25562 Pain in left knee: Secondary | ICD-10-CM | POA: Diagnosis not present

## 2017-10-08 DIAGNOSIS — M25562 Pain in left knee: Secondary | ICD-10-CM | POA: Diagnosis not present

## 2017-10-11 DIAGNOSIS — M25562 Pain in left knee: Secondary | ICD-10-CM | POA: Diagnosis not present

## 2017-10-15 DIAGNOSIS — M25562 Pain in left knee: Secondary | ICD-10-CM | POA: Diagnosis not present

## 2017-10-17 DIAGNOSIS — M25562 Pain in left knee: Secondary | ICD-10-CM | POA: Diagnosis not present

## 2017-10-22 DIAGNOSIS — M25562 Pain in left knee: Secondary | ICD-10-CM | POA: Diagnosis not present

## 2017-10-25 DIAGNOSIS — M25562 Pain in left knee: Secondary | ICD-10-CM | POA: Diagnosis not present

## 2017-10-29 DIAGNOSIS — M25562 Pain in left knee: Secondary | ICD-10-CM | POA: Diagnosis not present

## 2017-10-31 DIAGNOSIS — M25562 Pain in left knee: Secondary | ICD-10-CM | POA: Diagnosis not present

## 2017-11-26 DIAGNOSIS — M25562 Pain in left knee: Secondary | ICD-10-CM | POA: Diagnosis not present

## 2017-12-26 DIAGNOSIS — J029 Acute pharyngitis, unspecified: Secondary | ICD-10-CM | POA: Diagnosis not present

## 2018-01-29 DIAGNOSIS — M170 Bilateral primary osteoarthritis of knee: Secondary | ICD-10-CM | POA: Diagnosis not present

## 2018-01-29 DIAGNOSIS — G629 Polyneuropathy, unspecified: Secondary | ICD-10-CM | POA: Diagnosis not present

## 2018-01-29 DIAGNOSIS — I10 Essential (primary) hypertension: Secondary | ICD-10-CM | POA: Diagnosis not present

## 2018-01-29 DIAGNOSIS — E7800 Pure hypercholesterolemia, unspecified: Secondary | ICD-10-CM | POA: Diagnosis not present

## 2018-01-29 DIAGNOSIS — N400 Enlarged prostate without lower urinary tract symptoms: Secondary | ICD-10-CM | POA: Diagnosis not present

## 2018-06-19 DIAGNOSIS — M25562 Pain in left knee: Secondary | ICD-10-CM | POA: Diagnosis not present

## 2018-06-26 DIAGNOSIS — M1712 Unilateral primary osteoarthritis, left knee: Secondary | ICD-10-CM | POA: Diagnosis not present

## 2018-06-26 DIAGNOSIS — M25562 Pain in left knee: Secondary | ICD-10-CM | POA: Diagnosis not present

## 2018-06-26 DIAGNOSIS — R269 Unspecified abnormalities of gait and mobility: Secondary | ICD-10-CM | POA: Diagnosis not present

## 2018-07-03 DIAGNOSIS — M222X2 Patellofemoral disorders, left knee: Secondary | ICD-10-CM | POA: Diagnosis not present

## 2018-07-03 DIAGNOSIS — M25762 Osteophyte, left knee: Secondary | ICD-10-CM | POA: Diagnosis not present

## 2018-07-03 DIAGNOSIS — R2689 Other abnormalities of gait and mobility: Secondary | ICD-10-CM | POA: Diagnosis not present

## 2018-07-03 DIAGNOSIS — M25512 Pain in left shoulder: Secondary | ICD-10-CM | POA: Diagnosis not present

## 2018-07-03 DIAGNOSIS — M25562 Pain in left knee: Secondary | ICD-10-CM | POA: Diagnosis not present

## 2018-07-03 DIAGNOSIS — M1712 Unilateral primary osteoarthritis, left knee: Secondary | ICD-10-CM | POA: Diagnosis not present

## 2018-07-03 DIAGNOSIS — M25569 Pain in unspecified knee: Secondary | ICD-10-CM | POA: Diagnosis not present

## 2018-07-03 DIAGNOSIS — Z9189 Other specified personal risk factors, not elsewhere classified: Secondary | ICD-10-CM | POA: Diagnosis not present

## 2018-07-03 DIAGNOSIS — M179 Osteoarthritis of knee, unspecified: Secondary | ICD-10-CM | POA: Diagnosis not present

## 2018-07-10 DIAGNOSIS — M25512 Pain in left shoulder: Secondary | ICD-10-CM | POA: Diagnosis not present

## 2018-07-10 DIAGNOSIS — R2681 Unsteadiness on feet: Secondary | ICD-10-CM | POA: Diagnosis not present

## 2018-07-10 DIAGNOSIS — M25562 Pain in left knee: Secondary | ICD-10-CM | POA: Diagnosis not present

## 2018-07-10 DIAGNOSIS — M1712 Unilateral primary osteoarthritis, left knee: Secondary | ICD-10-CM | POA: Diagnosis not present

## 2018-07-10 DIAGNOSIS — Z9189 Other specified personal risk factors, not elsewhere classified: Secondary | ICD-10-CM | POA: Diagnosis not present

## 2018-07-17 DIAGNOSIS — M25762 Osteophyte, left knee: Secondary | ICD-10-CM | POA: Diagnosis not present

## 2018-07-17 DIAGNOSIS — M179 Osteoarthritis of knee, unspecified: Secondary | ICD-10-CM | POA: Diagnosis not present

## 2018-07-17 DIAGNOSIS — Z9189 Other specified personal risk factors, not elsewhere classified: Secondary | ICD-10-CM | POA: Diagnosis not present

## 2018-07-17 DIAGNOSIS — M222X2 Patellofemoral disorders, left knee: Secondary | ICD-10-CM | POA: Diagnosis not present

## 2018-07-17 DIAGNOSIS — M25512 Pain in left shoulder: Secondary | ICD-10-CM | POA: Diagnosis not present

## 2018-07-17 DIAGNOSIS — R269 Unspecified abnormalities of gait and mobility: Secondary | ICD-10-CM | POA: Diagnosis not present

## 2018-07-17 DIAGNOSIS — M1712 Unilateral primary osteoarthritis, left knee: Secondary | ICD-10-CM | POA: Diagnosis not present

## 2018-07-17 DIAGNOSIS — M25569 Pain in unspecified knee: Secondary | ICD-10-CM | POA: Diagnosis not present

## 2018-07-25 DIAGNOSIS — M25762 Osteophyte, left knee: Secondary | ICD-10-CM | POA: Diagnosis not present

## 2018-07-25 DIAGNOSIS — Z9189 Other specified personal risk factors, not elsewhere classified: Secondary | ICD-10-CM | POA: Diagnosis not present

## 2018-07-25 DIAGNOSIS — M1712 Unilateral primary osteoarthritis, left knee: Secondary | ICD-10-CM | POA: Diagnosis not present

## 2018-07-25 DIAGNOSIS — R269 Unspecified abnormalities of gait and mobility: Secondary | ICD-10-CM | POA: Diagnosis not present

## 2018-07-25 DIAGNOSIS — M222X2 Patellofemoral disorders, left knee: Secondary | ICD-10-CM | POA: Diagnosis not present

## 2018-07-25 DIAGNOSIS — M25569 Pain in unspecified knee: Secondary | ICD-10-CM | POA: Diagnosis not present

## 2018-07-25 DIAGNOSIS — M25512 Pain in left shoulder: Secondary | ICD-10-CM | POA: Diagnosis not present

## 2018-07-25 DIAGNOSIS — M179 Osteoarthritis of knee, unspecified: Secondary | ICD-10-CM | POA: Diagnosis not present

## 2018-07-25 DIAGNOSIS — R2689 Other abnormalities of gait and mobility: Secondary | ICD-10-CM | POA: Diagnosis not present

## 2018-08-02 DIAGNOSIS — Z7189 Other specified counseling: Secondary | ICD-10-CM | POA: Diagnosis not present

## 2018-08-02 DIAGNOSIS — Z20828 Contact with and (suspected) exposure to other viral communicable diseases: Secondary | ICD-10-CM | POA: Diagnosis not present

## 2018-08-02 DIAGNOSIS — Z0000 Encounter for general adult medical examination without abnormal findings: Secondary | ICD-10-CM | POA: Diagnosis not present

## 2018-08-02 DIAGNOSIS — J449 Chronic obstructive pulmonary disease, unspecified: Secondary | ICD-10-CM | POA: Diagnosis not present

## 2018-08-02 DIAGNOSIS — M109 Gout, unspecified: Secondary | ICD-10-CM | POA: Diagnosis not present

## 2018-08-02 DIAGNOSIS — G629 Polyneuropathy, unspecified: Secondary | ICD-10-CM | POA: Diagnosis not present

## 2018-08-02 DIAGNOSIS — Z7982 Long term (current) use of aspirin: Secondary | ICD-10-CM | POA: Diagnosis not present

## 2018-08-02 DIAGNOSIS — G43909 Migraine, unspecified, not intractable, without status migrainosus: Secondary | ICD-10-CM | POA: Diagnosis not present

## 2018-08-02 DIAGNOSIS — I10 Essential (primary) hypertension: Secondary | ICD-10-CM | POA: Diagnosis not present

## 2018-08-02 DIAGNOSIS — M549 Dorsalgia, unspecified: Secondary | ICD-10-CM | POA: Diagnosis not present

## 2018-08-02 DIAGNOSIS — K5730 Diverticulosis of large intestine without perforation or abscess without bleeding: Secondary | ICD-10-CM | POA: Diagnosis not present

## 2018-08-02 DIAGNOSIS — E669 Obesity, unspecified: Secondary | ICD-10-CM | POA: Diagnosis not present

## 2018-08-02 DIAGNOSIS — E119 Type 2 diabetes mellitus without complications: Secondary | ICD-10-CM | POA: Diagnosis not present

## 2018-08-02 DIAGNOSIS — Z79891 Long term (current) use of opiate analgesic: Secondary | ICD-10-CM | POA: Diagnosis not present

## 2018-08-02 DIAGNOSIS — K219 Gastro-esophageal reflux disease without esophagitis: Secondary | ICD-10-CM | POA: Diagnosis not present

## 2018-08-02 DIAGNOSIS — H35039 Hypertensive retinopathy, unspecified eye: Secondary | ICD-10-CM | POA: Diagnosis not present

## 2018-08-02 DIAGNOSIS — Z8261 Family history of arthritis: Secondary | ICD-10-CM | POA: Diagnosis not present

## 2018-08-02 DIAGNOSIS — E785 Hyperlipidemia, unspecified: Secondary | ICD-10-CM | POA: Diagnosis not present

## 2018-08-02 DIAGNOSIS — D125 Benign neoplasm of sigmoid colon: Secondary | ICD-10-CM | POA: Diagnosis not present

## 2018-08-02 DIAGNOSIS — Z90710 Acquired absence of both cervix and uterus: Secondary | ICD-10-CM | POA: Diagnosis not present

## 2018-08-02 DIAGNOSIS — Z791 Long term (current) use of non-steroidal anti-inflammatories (NSAID): Secondary | ICD-10-CM | POA: Diagnosis not present

## 2018-08-02 DIAGNOSIS — Z841 Family history of disorders of kidney and ureter: Secondary | ICD-10-CM | POA: Diagnosis not present

## 2018-08-02 DIAGNOSIS — Z85850 Personal history of malignant neoplasm of thyroid: Secondary | ICD-10-CM | POA: Diagnosis not present

## 2018-08-02 DIAGNOSIS — M2041 Other hammer toe(s) (acquired), right foot: Secondary | ICD-10-CM | POA: Diagnosis not present

## 2018-08-02 DIAGNOSIS — R7301 Impaired fasting glucose: Secondary | ICD-10-CM | POA: Diagnosis not present

## 2018-08-02 DIAGNOSIS — M25711 Osteophyte, right shoulder: Secondary | ICD-10-CM | POA: Diagnosis not present

## 2018-08-02 DIAGNOSIS — Z833 Family history of diabetes mellitus: Secondary | ICD-10-CM | POA: Diagnosis not present

## 2018-08-02 DIAGNOSIS — M75101 Unspecified rotator cuff tear or rupture of right shoulder, not specified as traumatic: Secondary | ICD-10-CM | POA: Diagnosis not present

## 2018-08-02 DIAGNOSIS — Z1211 Encounter for screening for malignant neoplasm of colon: Secondary | ICD-10-CM | POA: Diagnosis not present

## 2018-08-02 DIAGNOSIS — Z7951 Long term (current) use of inhaled steroids: Secondary | ICD-10-CM | POA: Diagnosis not present

## 2018-08-02 DIAGNOSIS — G8929 Other chronic pain: Secondary | ICD-10-CM | POA: Diagnosis not present

## 2018-08-02 DIAGNOSIS — M170 Bilateral primary osteoarthritis of knee: Secondary | ICD-10-CM | POA: Diagnosis not present

## 2018-08-02 DIAGNOSIS — Z8249 Family history of ischemic heart disease and other diseases of the circulatory system: Secondary | ICD-10-CM | POA: Diagnosis not present

## 2018-08-02 DIAGNOSIS — Z1389 Encounter for screening for other disorder: Secondary | ICD-10-CM | POA: Diagnosis not present

## 2018-08-02 DIAGNOSIS — M19011 Primary osteoarthritis, right shoulder: Secondary | ICD-10-CM | POA: Diagnosis not present

## 2018-08-02 DIAGNOSIS — Z79890 Hormone replacement therapy (postmenopausal): Secondary | ICD-10-CM | POA: Diagnosis not present

## 2018-08-02 DIAGNOSIS — Z8614 Personal history of Methicillin resistant Staphylococcus aureus infection: Secondary | ICD-10-CM | POA: Diagnosis not present

## 2018-08-02 DIAGNOSIS — N400 Enlarged prostate without lower urinary tract symptoms: Secondary | ICD-10-CM | POA: Diagnosis not present

## 2018-08-02 DIAGNOSIS — E7800 Pure hypercholesterolemia, unspecified: Secondary | ICD-10-CM | POA: Diagnosis not present

## 2018-09-05 DIAGNOSIS — M222X2 Patellofemoral disorders, left knee: Secondary | ICD-10-CM | POA: Diagnosis not present

## 2018-09-05 DIAGNOSIS — R2689 Other abnormalities of gait and mobility: Secondary | ICD-10-CM | POA: Diagnosis not present

## 2018-09-05 DIAGNOSIS — M25762 Osteophyte, left knee: Secondary | ICD-10-CM | POA: Diagnosis not present

## 2018-09-05 DIAGNOSIS — M1712 Unilateral primary osteoarthritis, left knee: Secondary | ICD-10-CM | POA: Diagnosis not present

## 2018-09-05 DIAGNOSIS — M25562 Pain in left knee: Secondary | ICD-10-CM | POA: Diagnosis not present

## 2018-10-08 DIAGNOSIS — I10 Essential (primary) hypertension: Secondary | ICD-10-CM | POA: Diagnosis not present

## 2018-10-08 DIAGNOSIS — N400 Enlarged prostate without lower urinary tract symptoms: Secondary | ICD-10-CM | POA: Diagnosis not present

## 2018-10-08 DIAGNOSIS — M170 Bilateral primary osteoarthritis of knee: Secondary | ICD-10-CM | POA: Diagnosis not present

## 2018-11-06 DIAGNOSIS — L578 Other skin changes due to chronic exposure to nonionizing radiation: Secondary | ICD-10-CM | POA: Diagnosis not present

## 2018-11-06 DIAGNOSIS — L820 Inflamed seborrheic keratosis: Secondary | ICD-10-CM | POA: Diagnosis not present

## 2018-11-06 DIAGNOSIS — D225 Melanocytic nevi of trunk: Secondary | ICD-10-CM | POA: Diagnosis not present

## 2019-01-29 DIAGNOSIS — M25512 Pain in left shoulder: Secondary | ICD-10-CM | POA: Diagnosis not present

## 2019-01-29 DIAGNOSIS — M222X2 Patellofemoral disorders, left knee: Secondary | ICD-10-CM | POA: Diagnosis not present

## 2019-01-29 DIAGNOSIS — M25762 Osteophyte, left knee: Secondary | ICD-10-CM | POA: Diagnosis not present

## 2019-01-29 DIAGNOSIS — M1712 Unilateral primary osteoarthritis, left knee: Secondary | ICD-10-CM | POA: Diagnosis not present

## 2019-02-13 DIAGNOSIS — Z7189 Other specified counseling: Secondary | ICD-10-CM | POA: Diagnosis not present

## 2019-02-13 DIAGNOSIS — M109 Gout, unspecified: Secondary | ICD-10-CM | POA: Diagnosis not present

## 2019-02-13 DIAGNOSIS — E669 Obesity, unspecified: Secondary | ICD-10-CM | POA: Diagnosis not present

## 2019-02-13 DIAGNOSIS — Z0000 Encounter for general adult medical examination without abnormal findings: Secondary | ICD-10-CM | POA: Diagnosis not present

## 2019-02-13 DIAGNOSIS — G629 Polyneuropathy, unspecified: Secondary | ICD-10-CM | POA: Diagnosis not present

## 2019-02-13 DIAGNOSIS — M170 Bilateral primary osteoarthritis of knee: Secondary | ICD-10-CM | POA: Diagnosis not present

## 2019-02-13 DIAGNOSIS — N400 Enlarged prostate without lower urinary tract symptoms: Secondary | ICD-10-CM | POA: Diagnosis not present

## 2019-02-13 DIAGNOSIS — R7301 Impaired fasting glucose: Secondary | ICD-10-CM | POA: Diagnosis not present

## 2019-02-13 DIAGNOSIS — Z1389 Encounter for screening for other disorder: Secondary | ICD-10-CM | POA: Diagnosis not present

## 2019-02-13 DIAGNOSIS — I10 Essential (primary) hypertension: Secondary | ICD-10-CM | POA: Diagnosis not present

## 2019-02-13 DIAGNOSIS — E7800 Pure hypercholesterolemia, unspecified: Secondary | ICD-10-CM | POA: Diagnosis not present

## 2019-02-17 DIAGNOSIS — I10 Essential (primary) hypertension: Secondary | ICD-10-CM | POA: Diagnosis not present

## 2019-02-17 DIAGNOSIS — R7301 Impaired fasting glucose: Secondary | ICD-10-CM | POA: Diagnosis not present

## 2019-02-17 DIAGNOSIS — Z79899 Other long term (current) drug therapy: Secondary | ICD-10-CM | POA: Diagnosis not present

## 2019-02-17 DIAGNOSIS — Z125 Encounter for screening for malignant neoplasm of prostate: Secondary | ICD-10-CM | POA: Diagnosis not present

## 2019-02-17 DIAGNOSIS — E7800 Pure hypercholesterolemia, unspecified: Secondary | ICD-10-CM | POA: Diagnosis not present

## 2019-02-17 DIAGNOSIS — Z23 Encounter for immunization: Secondary | ICD-10-CM | POA: Diagnosis not present

## 2019-02-17 DIAGNOSIS — N400 Enlarged prostate without lower urinary tract symptoms: Secondary | ICD-10-CM | POA: Diagnosis not present

## 2019-02-17 DIAGNOSIS — M109 Gout, unspecified: Secondary | ICD-10-CM | POA: Diagnosis not present

## 2019-02-17 DIAGNOSIS — M170 Bilateral primary osteoarthritis of knee: Secondary | ICD-10-CM | POA: Diagnosis not present

## 2019-02-17 DIAGNOSIS — G629 Polyneuropathy, unspecified: Secondary | ICD-10-CM | POA: Diagnosis not present

## 2019-02-27 DIAGNOSIS — M222X2 Patellofemoral disorders, left knee: Secondary | ICD-10-CM | POA: Diagnosis not present

## 2019-02-27 DIAGNOSIS — M1712 Unilateral primary osteoarthritis, left knee: Secondary | ICD-10-CM | POA: Diagnosis not present

## 2019-02-27 DIAGNOSIS — M25762 Osteophyte, left knee: Secondary | ICD-10-CM | POA: Diagnosis not present

## 2019-02-27 DIAGNOSIS — R2689 Other abnormalities of gait and mobility: Secondary | ICD-10-CM | POA: Diagnosis not present

## 2019-02-27 DIAGNOSIS — M25562 Pain in left knee: Secondary | ICD-10-CM | POA: Diagnosis not present

## 2019-04-17 DIAGNOSIS — I10 Essential (primary) hypertension: Secondary | ICD-10-CM | POA: Diagnosis not present

## 2019-04-17 DIAGNOSIS — E7800 Pure hypercholesterolemia, unspecified: Secondary | ICD-10-CM | POA: Diagnosis not present

## 2019-04-17 DIAGNOSIS — N400 Enlarged prostate without lower urinary tract symptoms: Secondary | ICD-10-CM | POA: Diagnosis not present

## 2019-04-17 DIAGNOSIS — M170 Bilateral primary osteoarthritis of knee: Secondary | ICD-10-CM | POA: Diagnosis not present

## 2019-06-24 DIAGNOSIS — N400 Enlarged prostate without lower urinary tract symptoms: Secondary | ICD-10-CM | POA: Diagnosis not present

## 2019-06-24 DIAGNOSIS — M170 Bilateral primary osteoarthritis of knee: Secondary | ICD-10-CM | POA: Diagnosis not present

## 2019-06-24 DIAGNOSIS — E7800 Pure hypercholesterolemia, unspecified: Secondary | ICD-10-CM | POA: Diagnosis not present

## 2019-06-24 DIAGNOSIS — I10 Essential (primary) hypertension: Secondary | ICD-10-CM | POA: Diagnosis not present

## 2019-07-30 DIAGNOSIS — C44519 Basal cell carcinoma of skin of other part of trunk: Secondary | ICD-10-CM | POA: Diagnosis not present

## 2019-07-30 DIAGNOSIS — L820 Inflamed seborrheic keratosis: Secondary | ICD-10-CM | POA: Diagnosis not present

## 2019-07-30 DIAGNOSIS — D485 Neoplasm of uncertain behavior of skin: Secondary | ICD-10-CM | POA: Diagnosis not present

## 2019-07-30 DIAGNOSIS — L853 Xerosis cutis: Secondary | ICD-10-CM | POA: Diagnosis not present

## 2019-07-30 DIAGNOSIS — L578 Other skin changes due to chronic exposure to nonionizing radiation: Secondary | ICD-10-CM | POA: Diagnosis not present

## 2019-07-31 DIAGNOSIS — C44519 Basal cell carcinoma of skin of other part of trunk: Secondary | ICD-10-CM | POA: Insufficient documentation

## 2019-08-06 DIAGNOSIS — C44519 Basal cell carcinoma of skin of other part of trunk: Secondary | ICD-10-CM | POA: Diagnosis not present

## 2019-08-12 DIAGNOSIS — I10 Essential (primary) hypertension: Secondary | ICD-10-CM | POA: Diagnosis not present

## 2019-08-12 DIAGNOSIS — E7800 Pure hypercholesterolemia, unspecified: Secondary | ICD-10-CM | POA: Diagnosis not present

## 2019-08-12 DIAGNOSIS — Z125 Encounter for screening for malignant neoplasm of prostate: Secondary | ICD-10-CM | POA: Diagnosis not present

## 2019-08-12 DIAGNOSIS — Z1389 Encounter for screening for other disorder: Secondary | ICD-10-CM | POA: Diagnosis not present

## 2019-08-12 DIAGNOSIS — Z0000 Encounter for general adult medical examination without abnormal findings: Secondary | ICD-10-CM | POA: Diagnosis not present

## 2019-08-12 DIAGNOSIS — N400 Enlarged prostate without lower urinary tract symptoms: Secondary | ICD-10-CM | POA: Diagnosis not present

## 2019-08-12 DIAGNOSIS — M170 Bilateral primary osteoarthritis of knee: Secondary | ICD-10-CM | POA: Diagnosis not present

## 2019-08-12 DIAGNOSIS — R7309 Other abnormal glucose: Secondary | ICD-10-CM | POA: Diagnosis not present

## 2019-08-12 DIAGNOSIS — G629 Polyneuropathy, unspecified: Secondary | ICD-10-CM | POA: Diagnosis not present

## 2019-08-12 DIAGNOSIS — M109 Gout, unspecified: Secondary | ICD-10-CM | POA: Diagnosis not present

## 2019-08-12 DIAGNOSIS — Z79899 Other long term (current) drug therapy: Secondary | ICD-10-CM | POA: Diagnosis not present

## 2019-08-18 DIAGNOSIS — R7309 Other abnormal glucose: Secondary | ICD-10-CM | POA: Diagnosis not present

## 2019-08-18 DIAGNOSIS — I10 Essential (primary) hypertension: Secondary | ICD-10-CM | POA: Diagnosis not present

## 2019-08-18 DIAGNOSIS — C44519 Basal cell carcinoma of skin of other part of trunk: Secondary | ICD-10-CM | POA: Diagnosis not present

## 2019-08-18 DIAGNOSIS — M170 Bilateral primary osteoarthritis of knee: Secondary | ICD-10-CM | POA: Diagnosis not present

## 2019-08-18 DIAGNOSIS — E7800 Pure hypercholesterolemia, unspecified: Secondary | ICD-10-CM | POA: Diagnosis not present

## 2019-08-18 DIAGNOSIS — Z125 Encounter for screening for malignant neoplasm of prostate: Secondary | ICD-10-CM | POA: Diagnosis not present

## 2019-08-18 DIAGNOSIS — M109 Gout, unspecified: Secondary | ICD-10-CM | POA: Diagnosis not present

## 2019-08-18 DIAGNOSIS — G629 Polyneuropathy, unspecified: Secondary | ICD-10-CM | POA: Diagnosis not present

## 2019-08-18 DIAGNOSIS — N400 Enlarged prostate without lower urinary tract symptoms: Secondary | ICD-10-CM | POA: Diagnosis not present

## 2019-08-25 DIAGNOSIS — M170 Bilateral primary osteoarthritis of knee: Secondary | ICD-10-CM | POA: Diagnosis not present

## 2019-08-25 DIAGNOSIS — N400 Enlarged prostate without lower urinary tract symptoms: Secondary | ICD-10-CM | POA: Diagnosis not present

## 2019-08-25 DIAGNOSIS — E7800 Pure hypercholesterolemia, unspecified: Secondary | ICD-10-CM | POA: Diagnosis not present

## 2019-08-25 DIAGNOSIS — I10 Essential (primary) hypertension: Secondary | ICD-10-CM | POA: Diagnosis not present

## 2019-09-18 DIAGNOSIS — I10 Essential (primary) hypertension: Secondary | ICD-10-CM | POA: Diagnosis not present

## 2019-09-18 DIAGNOSIS — E7800 Pure hypercholesterolemia, unspecified: Secondary | ICD-10-CM | POA: Diagnosis not present

## 2019-09-18 DIAGNOSIS — N400 Enlarged prostate without lower urinary tract symptoms: Secondary | ICD-10-CM | POA: Diagnosis not present

## 2019-09-18 DIAGNOSIS — M170 Bilateral primary osteoarthritis of knee: Secondary | ICD-10-CM | POA: Diagnosis not present

## 2019-09-19 DIAGNOSIS — Z1211 Encounter for screening for malignant neoplasm of colon: Secondary | ICD-10-CM | POA: Diagnosis not present

## 2019-09-19 DIAGNOSIS — Z1212 Encounter for screening for malignant neoplasm of rectum: Secondary | ICD-10-CM | POA: Diagnosis not present

## 2019-11-10 ENCOUNTER — Other Ambulatory Visit: Payer: Self-pay | Admitting: Gastroenterology

## 2019-11-10 DIAGNOSIS — R195 Other fecal abnormalities: Secondary | ICD-10-CM

## 2019-11-12 DIAGNOSIS — M170 Bilateral primary osteoarthritis of knee: Secondary | ICD-10-CM | POA: Diagnosis not present

## 2019-11-12 DIAGNOSIS — I10 Essential (primary) hypertension: Secondary | ICD-10-CM | POA: Diagnosis not present

## 2019-11-12 DIAGNOSIS — N400 Enlarged prostate without lower urinary tract symptoms: Secondary | ICD-10-CM | POA: Diagnosis not present

## 2019-11-12 DIAGNOSIS — E7800 Pure hypercholesterolemia, unspecified: Secondary | ICD-10-CM | POA: Diagnosis not present

## 2019-11-26 DIAGNOSIS — C44519 Basal cell carcinoma of skin of other part of trunk: Secondary | ICD-10-CM | POA: Diagnosis not present

## 2019-11-26 DIAGNOSIS — D225 Melanocytic nevi of trunk: Secondary | ICD-10-CM | POA: Diagnosis not present

## 2019-11-26 DIAGNOSIS — D485 Neoplasm of uncertain behavior of skin: Secondary | ICD-10-CM | POA: Diagnosis not present

## 2019-11-28 ENCOUNTER — Ambulatory Visit
Admission: RE | Admit: 2019-11-28 | Discharge: 2019-11-28 | Disposition: A | Discharge status: Home / Self Care | Payer: PPO | Source: Ambulatory Visit | Attending: Gastroenterology | Admitting: Gastroenterology

## 2019-11-28 DIAGNOSIS — M47816 Spondylosis without myelopathy or radiculopathy, lumbar region: Secondary | ICD-10-CM | POA: Diagnosis not present

## 2019-11-28 DIAGNOSIS — I700 Atherosclerosis of aorta: Secondary | ICD-10-CM | POA: Diagnosis not present

## 2019-11-28 DIAGNOSIS — N400 Enlarged prostate without lower urinary tract symptoms: Secondary | ICD-10-CM | POA: Diagnosis not present

## 2019-11-28 DIAGNOSIS — R195 Other fecal abnormalities: Secondary | ICD-10-CM

## 2019-11-28 DIAGNOSIS — K5730 Diverticulosis of large intestine without perforation or abscess without bleeding: Secondary | ICD-10-CM | POA: Diagnosis not present

## 2019-11-28 IMAGING — CT CT VIRTUAL COLONOSCOPY DIAGNOSTIC
2 of 9 series · 12 of 46 positions shown, 14 images · non-contrast
Comparison: [DATE]

CLINICAL DATA: Positive Cologuard

EXAM:
CT VIRTUAL COLONOSCOPY DIAGNOSTIC
TECHNIQUE: The patient was given a standard bowel preparation with Gastrografin
and barium for fluid and stool tagging respectively. The quality of
the bowel preparation is moderate. Automated CO2 insufflation of the
colon was performed prior to image acquisition and colonic
distention is good. Image post processing was used to generate a 3D
endoluminal fly-through projection of the colon and to
electronically subtract stool/fluid as appropriate.

[Series 3: supine colon 1.50 br40 s3 supine thins · axial · 0.92mm/px · z∈[+1137,+1614]mm · 9 of 398 slices shown, 11 images]
[im 40/398  soft-tissue]
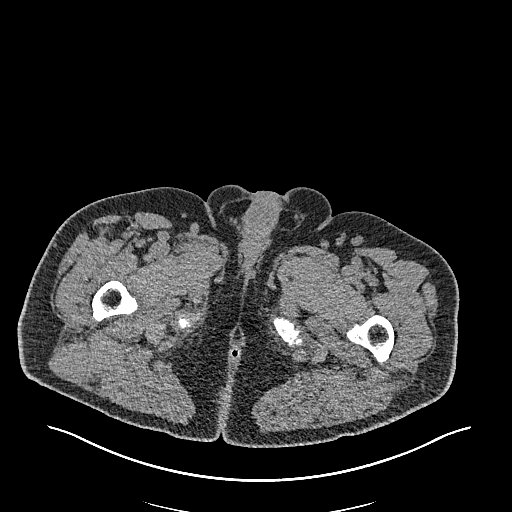
[im 40/398  bone]
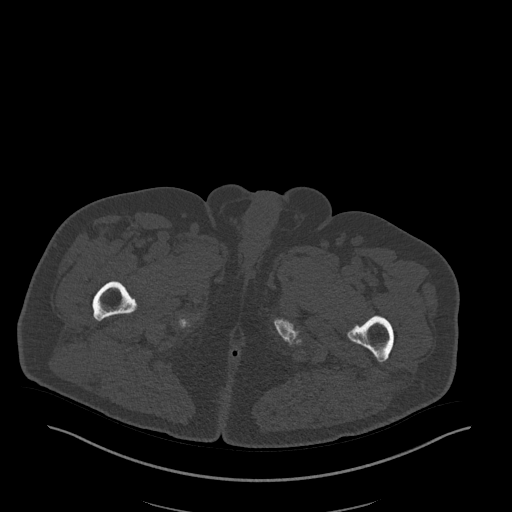
[im 80/398  soft-tissue]
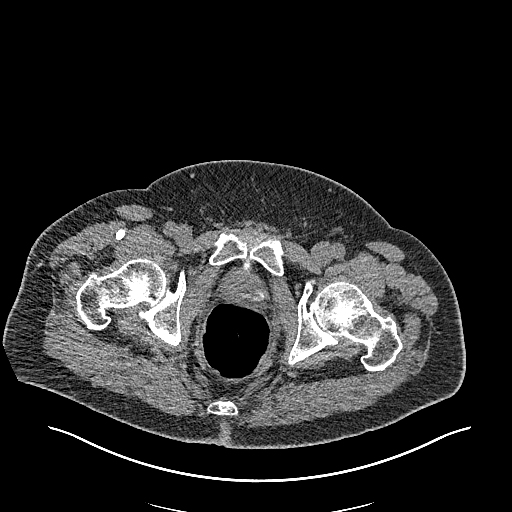
[im 120/398  soft-tissue]
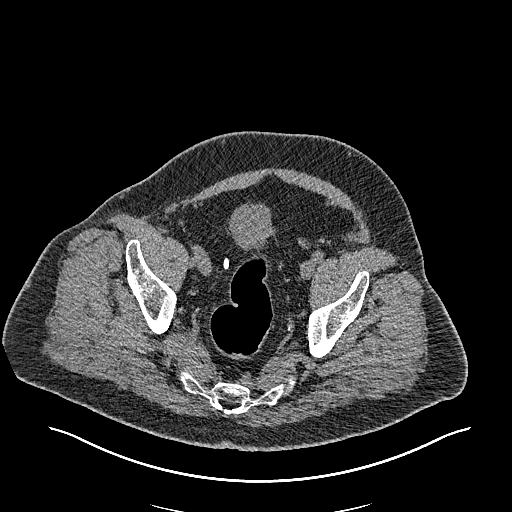
[im 159/398  soft-tissue]
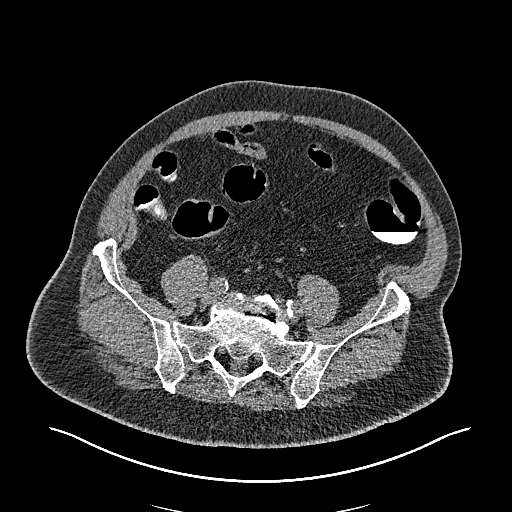
[im 199/398  soft-tissue]
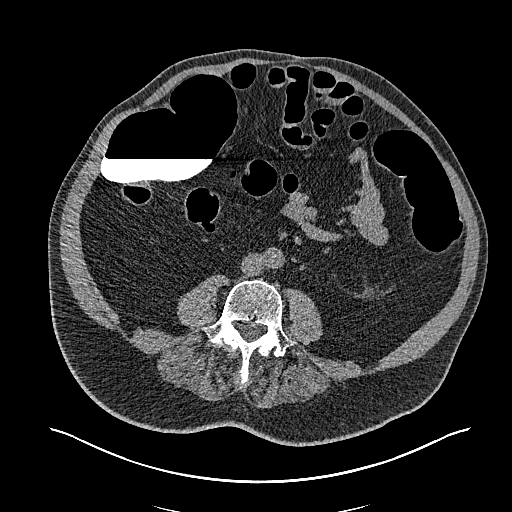
[im 239/398  soft-tissue]
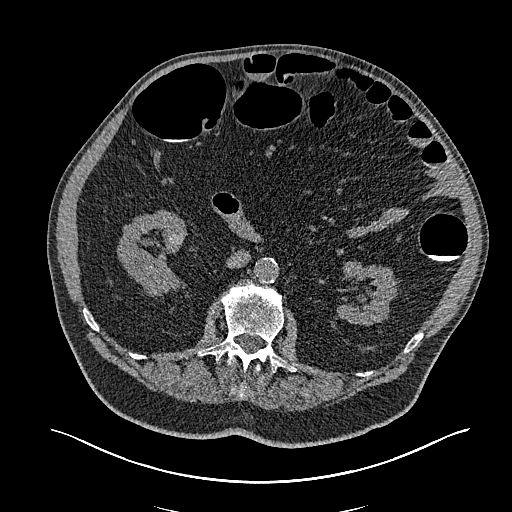
[im 278/398  soft-tissue]
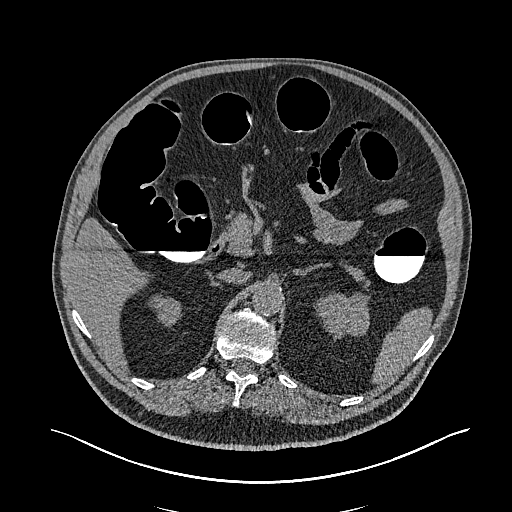
[im 318/398  soft-tissue]
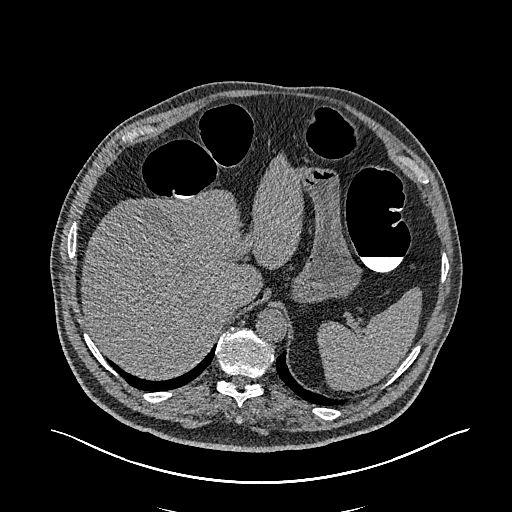
[im 358/398  soft-tissue]
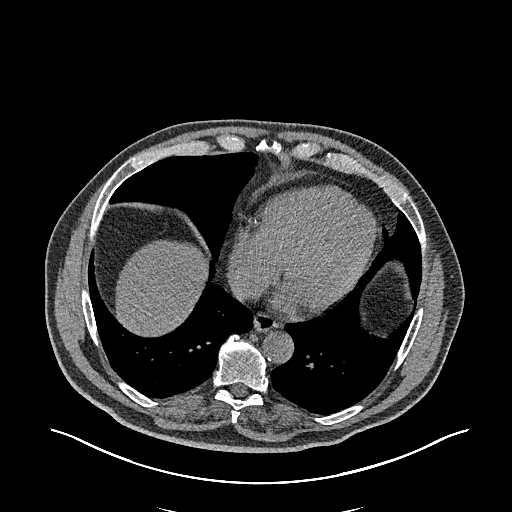
[im 358/398  bone]
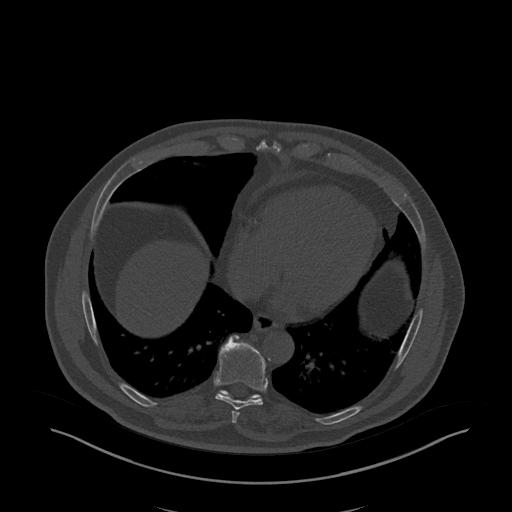

[Series 5: supine colon 3.00 br40 s3 cor supine · coronal · 0.85mm/px · 3 of 128 slices shown]
[im 32/128  soft-tissue]
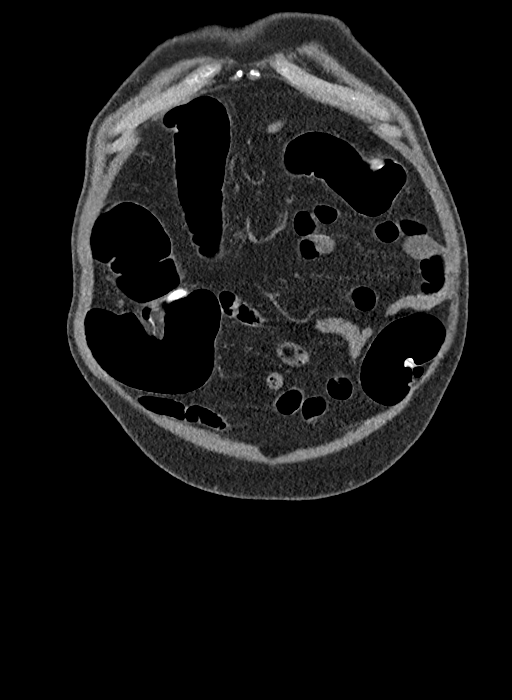
[im 64/128  soft-tissue]
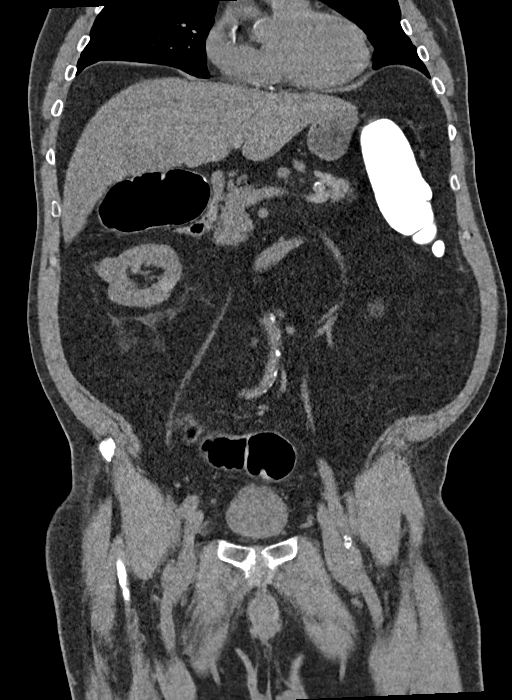
[im 96/128  soft-tissue]
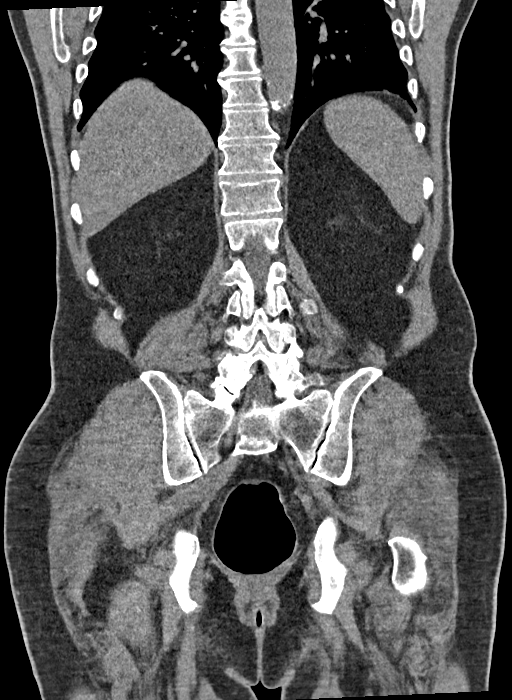

[12 of 46 positions shown; findings below may reference images not displayed]

FINDINGS: VIRTUAL COLONOSCOPY

Moderate retained layering barium. Scattered diverticula throughout
the colon. No fixed non barium tagged polypoid filling defects or
annular constricting lesions.

Virtual colonoscopy is not designed to detect diminutive polyps
(i.e., less than or equal to 5 mm), the presence or absence of which
may not affect clinical management.

CT ABDOMEN AND PELVIS WITHOUT CONTRAST

Lower chest: Lung bases are clear. No effusions. Heart is normal
size.

Hepatobiliary: Gallstones layering within the gallbladder, the
largest 11 mm. No focal hepatic abnormality.

Pancreas: No focal abnormality or ductal dilatation.

Spleen: No focal abnormality.  Normal size.

Adrenals/Urinary Tract: Small exophytic low-density lesion off the
upper pole of the left kidney is stable since prior study compatible
with small cyst. Low-density lesion in the midpole of the is right
kidney measuring 2.1 cm has enlarged since prior study. No
hydronephrosis. Adrenal glands and urinary bladder unremarkable.

Stomach/Bowel: Stomach and small bowel decompressed, unremarkable.

Vascular/Lymphatic: Aortic atherosclerosis. No evidence of aneurysm
or adenopathy.

Reproductive: Prostate enlargement calcifications.

Other: No free fluid or free air.

Musculoskeletal: No acute bony abnormality. Degenerative changes in
the lumbar spine.
IMPRESSION: Scattered colonic diverticula. No fixed non barium tagged polypoid
filling defects or annular constricting lesions.

Stable low-density lesion off the upper pole of the left kidney,
likely small cysts. Enlarging low-density lesion in the midpole of
the right kidney which cannot be characterized on this noncontrast
study. This could be further characterized with renal ultrasound.

Aortic atherosclerosis.

Prostate enlargement.

Cholelithiasis.

## 2019-12-11 DIAGNOSIS — M170 Bilateral primary osteoarthritis of knee: Secondary | ICD-10-CM | POA: Diagnosis not present

## 2019-12-11 DIAGNOSIS — N400 Enlarged prostate without lower urinary tract symptoms: Secondary | ICD-10-CM | POA: Diagnosis not present

## 2019-12-11 DIAGNOSIS — E7800 Pure hypercholesterolemia, unspecified: Secondary | ICD-10-CM | POA: Diagnosis not present

## 2019-12-11 DIAGNOSIS — I10 Essential (primary) hypertension: Secondary | ICD-10-CM | POA: Diagnosis not present

## 2020-01-01 DIAGNOSIS — D4101 Neoplasm of uncertain behavior of right kidney: Secondary | ICD-10-CM | POA: Diagnosis not present

## 2020-01-01 DIAGNOSIS — N5201 Erectile dysfunction due to arterial insufficiency: Secondary | ICD-10-CM | POA: Diagnosis not present

## 2020-01-01 DIAGNOSIS — N400 Enlarged prostate without lower urinary tract symptoms: Secondary | ICD-10-CM | POA: Diagnosis not present

## 2020-01-18 DIAGNOSIS — L0390 Cellulitis, unspecified: Secondary | ICD-10-CM | POA: Diagnosis not present

## 2020-01-18 DIAGNOSIS — G629 Polyneuropathy, unspecified: Secondary | ICD-10-CM | POA: Diagnosis not present

## 2020-01-18 DIAGNOSIS — S92912A Unspecified fracture of left toe(s), initial encounter for closed fracture: Secondary | ICD-10-CM | POA: Diagnosis not present

## 2020-01-18 DIAGNOSIS — S91105A Unspecified open wound of left lesser toe(s) without damage to nail, initial encounter: Secondary | ICD-10-CM | POA: Diagnosis not present

## 2020-01-18 DIAGNOSIS — S99922A Unspecified injury of left foot, initial encounter: Secondary | ICD-10-CM | POA: Diagnosis not present

## 2020-01-19 DIAGNOSIS — L0390 Cellulitis, unspecified: Secondary | ICD-10-CM | POA: Diagnosis not present

## 2020-01-19 DIAGNOSIS — S91105D Unspecified open wound of left lesser toe(s) without damage to nail, subsequent encounter: Secondary | ICD-10-CM | POA: Diagnosis not present

## 2020-01-21 DIAGNOSIS — S91105D Unspecified open wound of left lesser toe(s) without damage to nail, subsequent encounter: Secondary | ICD-10-CM | POA: Diagnosis not present

## 2020-01-22 DIAGNOSIS — K8020 Calculus of gallbladder without cholecystitis without obstruction: Secondary | ICD-10-CM | POA: Diagnosis not present

## 2020-01-22 DIAGNOSIS — K7689 Other specified diseases of liver: Secondary | ICD-10-CM | POA: Diagnosis not present

## 2020-01-22 DIAGNOSIS — N289 Disorder of kidney and ureter, unspecified: Secondary | ICD-10-CM | POA: Diagnosis not present

## 2020-01-22 DIAGNOSIS — D4101 Neoplasm of uncertain behavior of right kidney: Secondary | ICD-10-CM | POA: Diagnosis not present

## 2020-01-22 DIAGNOSIS — N281 Cyst of kidney, acquired: Secondary | ICD-10-CM | POA: Diagnosis not present

## 2020-01-22 DIAGNOSIS — K429 Umbilical hernia without obstruction or gangrene: Secondary | ICD-10-CM | POA: Diagnosis not present

## 2020-01-23 DIAGNOSIS — S91105D Unspecified open wound of left lesser toe(s) without damage to nail, subsequent encounter: Secondary | ICD-10-CM | POA: Diagnosis not present

## 2020-01-25 DIAGNOSIS — S91105D Unspecified open wound of left lesser toe(s) without damage to nail, subsequent encounter: Secondary | ICD-10-CM | POA: Diagnosis not present

## 2020-02-01 DIAGNOSIS — G629 Polyneuropathy, unspecified: Secondary | ICD-10-CM | POA: Diagnosis not present

## 2020-02-01 DIAGNOSIS — S91105D Unspecified open wound of left lesser toe(s) without damage to nail, subsequent encounter: Secondary | ICD-10-CM | POA: Diagnosis not present

## 2020-02-03 DIAGNOSIS — R7309 Other abnormal glucose: Secondary | ICD-10-CM | POA: Diagnosis not present

## 2020-02-03 DIAGNOSIS — G629 Polyneuropathy, unspecified: Secondary | ICD-10-CM | POA: Diagnosis not present

## 2020-02-03 DIAGNOSIS — M170 Bilateral primary osteoarthritis of knee: Secondary | ICD-10-CM | POA: Diagnosis not present

## 2020-02-03 DIAGNOSIS — N400 Enlarged prostate without lower urinary tract symptoms: Secondary | ICD-10-CM | POA: Diagnosis not present

## 2020-02-03 DIAGNOSIS — M109 Gout, unspecified: Secondary | ICD-10-CM | POA: Diagnosis not present

## 2020-02-03 DIAGNOSIS — C44519 Basal cell carcinoma of skin of other part of trunk: Secondary | ICD-10-CM | POA: Diagnosis not present

## 2020-02-03 DIAGNOSIS — I10 Essential (primary) hypertension: Secondary | ICD-10-CM | POA: Diagnosis not present

## 2020-02-03 DIAGNOSIS — E7800 Pure hypercholesterolemia, unspecified: Secondary | ICD-10-CM | POA: Diagnosis not present

## 2020-02-08 DIAGNOSIS — M170 Bilateral primary osteoarthritis of knee: Secondary | ICD-10-CM | POA: Diagnosis not present

## 2020-02-08 DIAGNOSIS — N400 Enlarged prostate without lower urinary tract symptoms: Secondary | ICD-10-CM | POA: Diagnosis not present

## 2020-02-08 DIAGNOSIS — E7800 Pure hypercholesterolemia, unspecified: Secondary | ICD-10-CM | POA: Diagnosis not present

## 2020-02-08 DIAGNOSIS — I10 Essential (primary) hypertension: Secondary | ICD-10-CM | POA: Diagnosis not present

## 2020-02-10 DIAGNOSIS — N400 Enlarged prostate without lower urinary tract symptoms: Secondary | ICD-10-CM | POA: Diagnosis not present

## 2020-02-10 DIAGNOSIS — N281 Cyst of kidney, acquired: Secondary | ICD-10-CM | POA: Diagnosis not present

## 2020-02-10 DIAGNOSIS — R972 Elevated prostate specific antigen [PSA]: Secondary | ICD-10-CM | POA: Diagnosis not present

## 2020-02-11 ENCOUNTER — Other Ambulatory Visit (HOSPITAL_COMMUNITY): Payer: Self-pay | Admitting: Urology

## 2020-02-11 ENCOUNTER — Other Ambulatory Visit: Payer: Self-pay | Admitting: Urology

## 2020-02-11 DIAGNOSIS — M109 Gout, unspecified: Secondary | ICD-10-CM | POA: Diagnosis not present

## 2020-02-11 DIAGNOSIS — C44519 Basal cell carcinoma of skin of other part of trunk: Secondary | ICD-10-CM | POA: Diagnosis not present

## 2020-02-11 DIAGNOSIS — K862 Cyst of pancreas: Secondary | ICD-10-CM

## 2020-02-11 DIAGNOSIS — I10 Essential (primary) hypertension: Secondary | ICD-10-CM | POA: Diagnosis not present

## 2020-02-11 DIAGNOSIS — K769 Liver disease, unspecified: Secondary | ICD-10-CM | POA: Diagnosis not present

## 2020-02-11 DIAGNOSIS — G629 Polyneuropathy, unspecified: Secondary | ICD-10-CM | POA: Diagnosis not present

## 2020-02-11 DIAGNOSIS — Z1389 Encounter for screening for other disorder: Secondary | ICD-10-CM | POA: Diagnosis not present

## 2020-02-11 DIAGNOSIS — Z0000 Encounter for general adult medical examination without abnormal findings: Secondary | ICD-10-CM | POA: Diagnosis not present

## 2020-02-11 DIAGNOSIS — N400 Enlarged prostate without lower urinary tract symptoms: Secondary | ICD-10-CM | POA: Diagnosis not present

## 2020-02-11 DIAGNOSIS — R7309 Other abnormal glucose: Secondary | ICD-10-CM | POA: Diagnosis not present

## 2020-02-11 DIAGNOSIS — N529 Male erectile dysfunction, unspecified: Secondary | ICD-10-CM | POA: Diagnosis not present

## 2020-02-11 DIAGNOSIS — M170 Bilateral primary osteoarthritis of knee: Secondary | ICD-10-CM | POA: Diagnosis not present

## 2020-02-11 DIAGNOSIS — E7800 Pure hypercholesterolemia, unspecified: Secondary | ICD-10-CM | POA: Diagnosis not present

## 2020-03-05 ENCOUNTER — Ambulatory Visit: Payer: PPO | Admitting: Registered"

## 2020-03-11 ENCOUNTER — Other Ambulatory Visit: Payer: Self-pay

## 2020-03-11 ENCOUNTER — Ambulatory Visit (HOSPITAL_COMMUNITY)
Admission: RE | Admit: 2020-03-11 | Discharge: 2020-03-11 | Disposition: A | Discharge status: Home / Self Care | Payer: PPO | Source: Ambulatory Visit | Attending: Urology | Admitting: Urology

## 2020-03-11 ENCOUNTER — Other Ambulatory Visit (HOSPITAL_COMMUNITY): Payer: Self-pay | Admitting: Urology

## 2020-03-11 DIAGNOSIS — K862 Cyst of pancreas: Secondary | ICD-10-CM | POA: Diagnosis not present

## 2020-03-11 DIAGNOSIS — K7689 Other specified diseases of liver: Secondary | ICD-10-CM | POA: Diagnosis not present

## 2020-03-11 DIAGNOSIS — K8689 Other specified diseases of pancreas: Secondary | ICD-10-CM | POA: Diagnosis not present

## 2020-03-11 DIAGNOSIS — R935 Abnormal findings on diagnostic imaging of other abdominal regions, including retroperitoneum: Secondary | ICD-10-CM | POA: Diagnosis not present

## 2020-03-11 DIAGNOSIS — N281 Cyst of kidney, acquired: Secondary | ICD-10-CM | POA: Diagnosis not present

## 2020-03-11 IMAGING — MR MR ABDOMEN WO/W CM
18 of 20 series · 45 of 48 positions shown · IV contrast (gadavist)
Comparison: CT [DATE]

CLINICAL DATA: Indeterminate enhancing lesion in the liver CT exam.
Pancreatic cystic lesions identified. MRI recommended for further
characterization.

EXAM:
MRI ABDOMEN WITHOUT AND WITH CONTRAST
TECHNIQUE: Multiplanar multisequence MR imaging of the abdomen was performed
both before and after the administration of intravenous contrast.
CONTRAST:  10mL GADAVIST GADOBUTROL 1 MMOL/ML IV SOLN

[Series 3: T2 · coronal · 7.0mm · 1.72mm/px · 1 of 35 slices shown (1 of 2)]
[im 1/35]
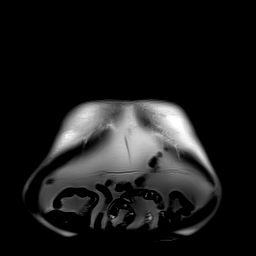

[Series 5: T2 fat-sat · axial · 6.0mm · 1.38mm/px · z∈[-227,+125]mm · 2 of 50 slices shown]
[im 1/50]
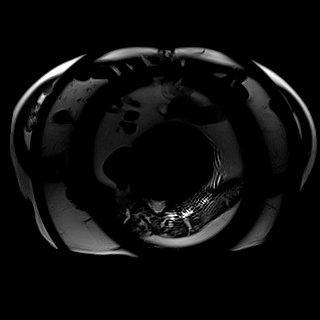
[im 50/50]
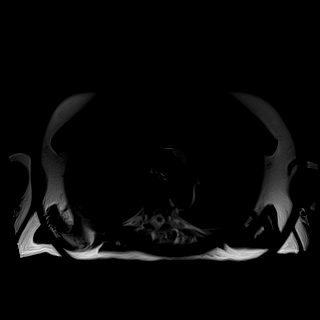

[Series 6: T1 · axial · 4.0mm · 1.38mm/px · z∈[-240,+108]mm · 3 of 88 slices shown (1 of 2)]
[im 1/88]
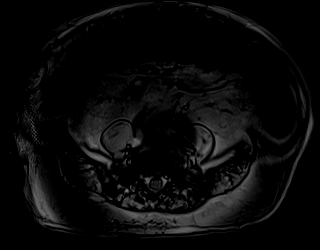
[im 44/88]
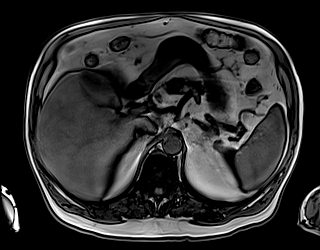
[im 88/88]
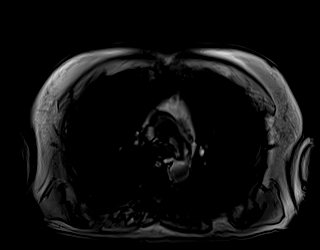

[Series 7: T1 · axial · 4.0mm · 1.38mm/px · z∈[-240,+108]mm · 3 of 88 slices shown (2 of 2)]
[im 1/88]
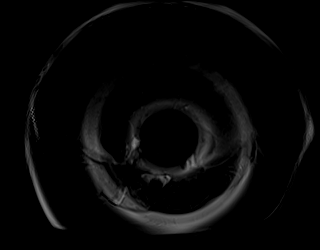
[im 44/88]
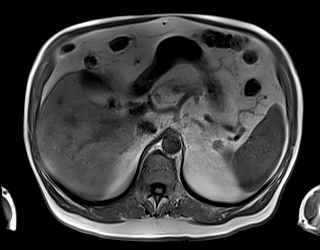
[im 88/88]
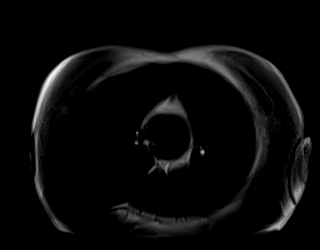

[Series 8: DWI · axial · 6.0mm · 1.64mm/px · z∈[-233,+91]mm · 3 of 92 slices shown (1 of 2)]
[im 1/92]
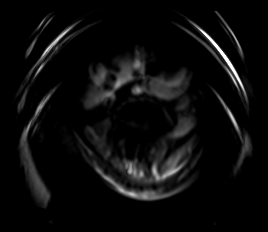
[im 46/92]
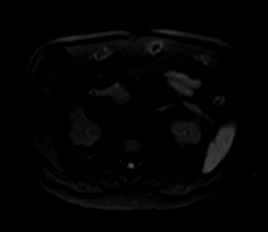
[im 92/92]
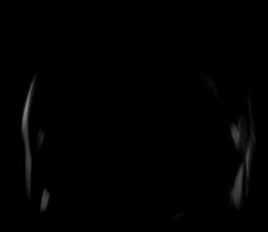

[Series 9: DWI · axial · 6.0mm · 1.64mm/px · 1 of 46 slices shown (2 of 2)]
[im 1/46]
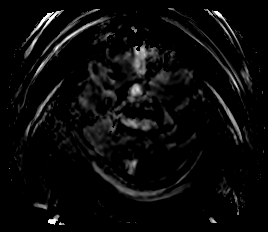

[Series 10: bSSFP · axial · 7.0mm · 1.38mm/px · 1 of 42 slices shown]
[im 1/42]
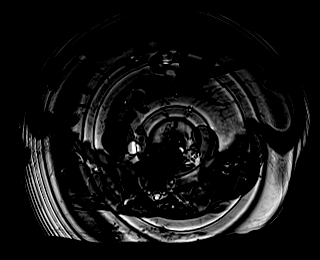

[Series 13: T1 dynamic · axial · 3.0mm · 1.38mm/px · z∈[-241,+68]mm · 3 of 104 slices shown (1 of 6)]
[im 1/104]
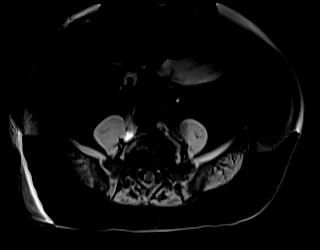
[im 52/104]
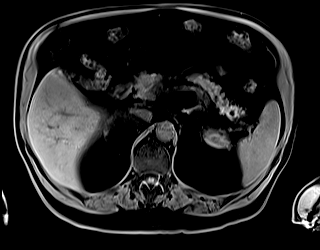
[im 104/104]
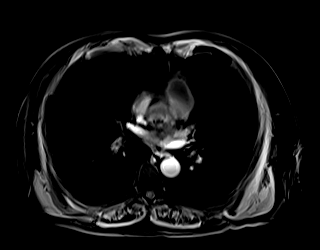

[Series 17: T1 dynamic · axial · 3.0mm · 1.38mm/px · z∈[-241,+68]mm · 3 of 104 slices shown (2 of 6)]
[im 1/104]
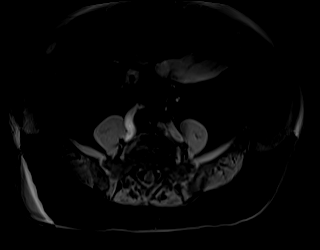
[im 52/104]
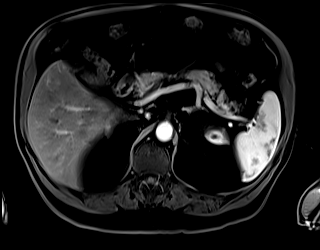
[im 104/104]
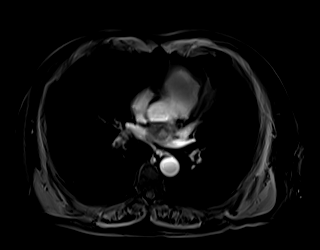

[Series 19: T1 dynamic · axial · 3.0mm · 1.38mm/px · z∈[-241,+68]mm · 3 of 104 slices shown (3 of 6)]
[im 1/104]
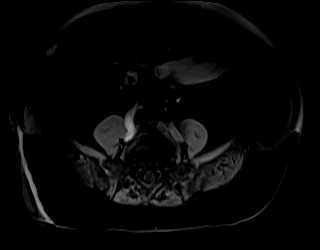
[im 52/104]
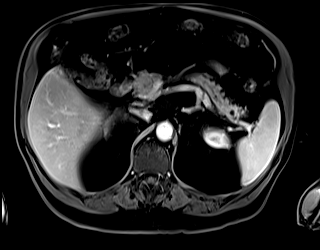
[im 104/104]
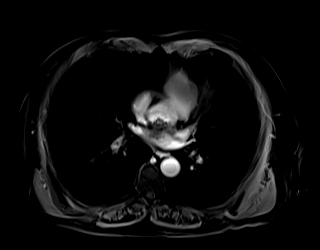

[Series 21: T1 dynamic · axial · 3.0mm · 1.38mm/px · z∈[-241,+68]mm · 3 of 104 slices shown (4 of 6)]
[im 1/104]
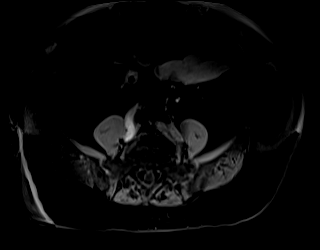
[im 52/104]
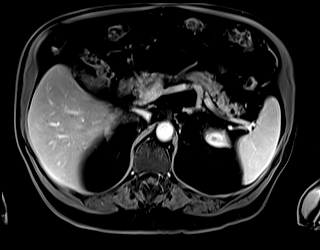
[im 104/104]
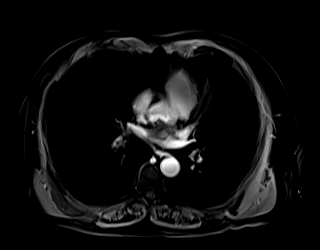

[Series 23: T1 dynamic · coronal · 5.0mm · 1.41mm/px · 2 of 56 slices shown (5 of 6)]
[im 1/56]
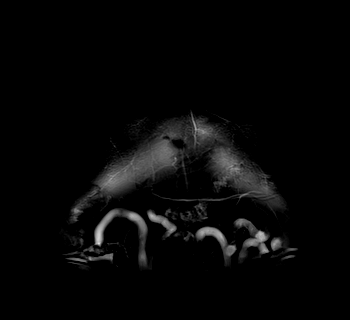
[im 56/56]
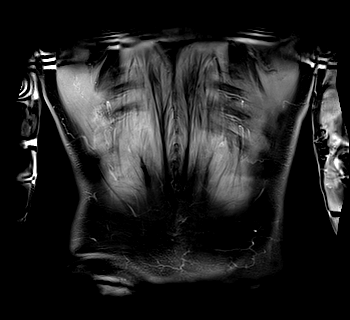

[Series 24: T2 · axial · 6.0mm · 1.72mm/px · z∈[-249,+97]mm · 2 of 49 slices shown (2 of 2)]
[im 1/49]
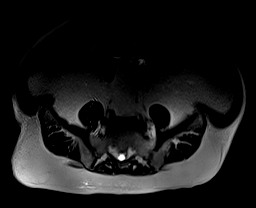
[im 49/49]
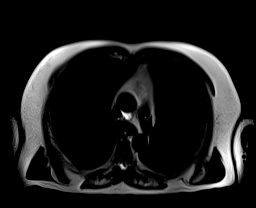

[Series 26: T1 dynamic · axial · 3.0mm · 1.38mm/px · z∈[-241,+68]mm · 3 of 104 slices shown (6 of 6)]
[im 1/104]
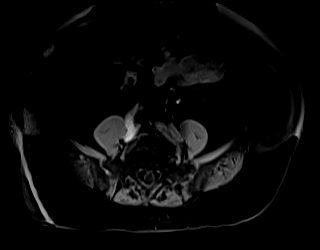
[im 52/104]
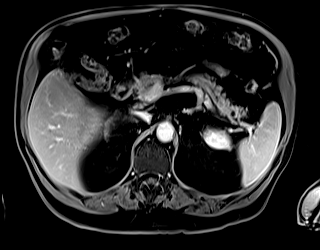
[im 104/104]
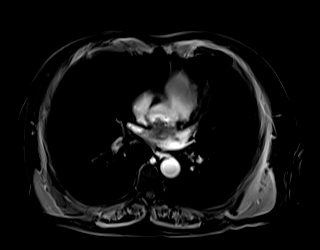

[Series 101: sub 20 sec · axial · 3.0mm · 1.38mm/px · z∈[-241,+68]mm · 3 of 104 slices shown]
[im 1/104]
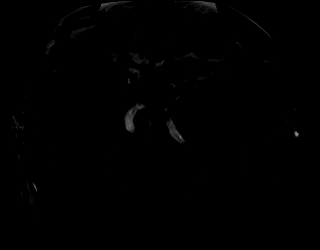
[im 52/104]
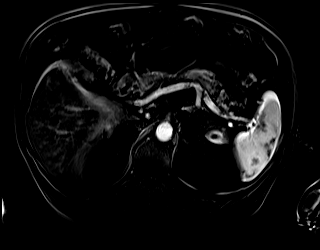
[im 104/104]
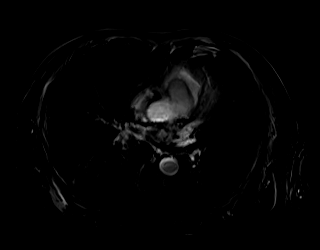

[Series 102: sub 45 sec · axial · 3.0mm · 1.38mm/px · z∈[-241,+68]mm · 3 of 104 slices shown]
[im 1/104]
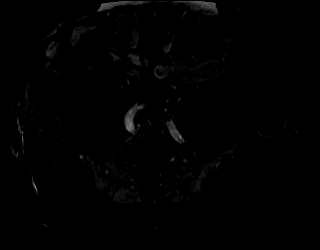
[im 52/104]
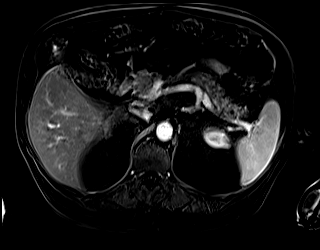
[im 104/104]
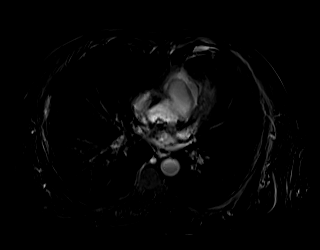

[Series 103: sub 90 sec · axial · 3.0mm · 1.38mm/px · z∈[-241,+68]mm · 3 of 104 slices shown]
[im 1/104]
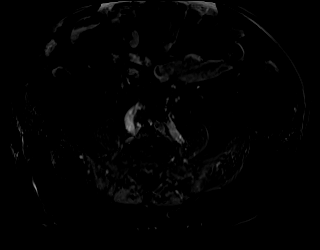
[im 52/104]
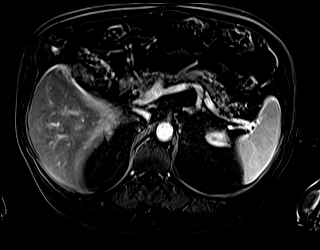
[im 104/104]
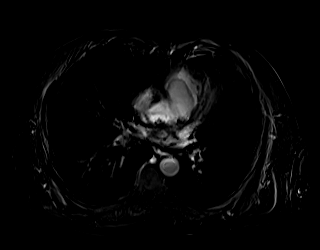

[Series 104: sub 3 min · axial · 3.0mm · 1.38mm/px · z∈[-241,+68]mm · 3 of 104 slices shown]
[im 1/104]
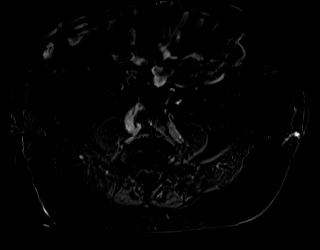
[im 52/104]
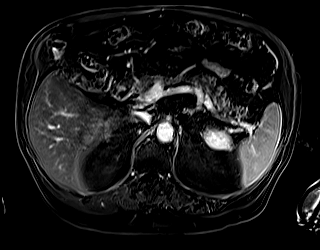
[im 104/104]
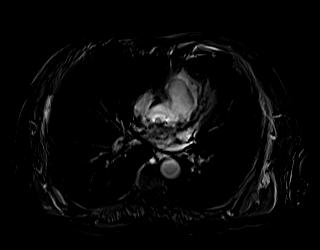

[45 of 48 positions shown; findings below may reference images not displayed]

FINDINGS: Lower chest:  Lung bases are clear.

Hepatobiliary: Angular lesion within the RIGHT hepatic lobe
demonstrates early vascular enhancement (image 49/series 17)
measuring 21 mm. This corresponds to the indeterminate lesion on
comparison CT. The lesion is isointense on the delayed sequences
with a small central central enhancing portion remaining which is
much smaller at 6 mm (image 48/series 21). Lesion subtly evident on
the noncontrast T2 weighted series (image 21/series 5).

No additional hepatic lesions. No hepatic steatosis. Normal biliary
tree. There is a gallstone within a nondistended gallbladder
measuring 10 mm. The common bile duct normal.

Pancreas: There are several cysts within the body and tail the
pancreas without communication with the pancreatic duct. The
pancreatic duct is normal caliber. The largest cyst is in the mid
body measuring 16 mm (image 15/series 3/coronal T2 imaging). This
cyst measures 13 mm on axial image (image 25/series 24). Other cysts
are considerably smaller. Again no communication with the duct.

The pancreatic cysts are unilocular without enhancement.

Spleen: Normal spleen.

Adrenals/urinary tract: Adrenal glands normal. Bilateral
nonenhancing renal cysts.

Stomach/Bowel: Stomach and limited of the small bowel is
unremarkable

Vascular/Lymphatic: Abdominal aortic normal caliber. No
retroperitoneal periportal lymphadenopathy.

Musculoskeletal: No aggressive osseous lesion
IMPRESSION: 1. Benign-appearing cystic lesion in the mid pancreas and several
additional small cystic lesions scattered throughout the pancreas.
Per consensus criteria, recommend follow-up MRI with and without
contrast in 6 months. This recommendation follows ACR consensus
guidelines: Management of Incidental Pancreatic Cysts: A White Paper
of the ACR Incidental Findings Committee. [HOSPITAL]
[[place]];[DATE].
2. Early enhancing lesion within the RIGHT hepatic lobe is favored
benign vascular phenomena or flash filling hemangioma. Favor
vascular phenomena such as portal venous shunt. Recommend follow-up
at the same time as the pancreatic cyst follow-up.
3. Bilateral benign nonenhancing renal cysts (Bosniak 1 and Bosniak
2).

## 2020-03-11 IMAGING — MR MR 3D RECON AT [facility]
18 of 20 series · 45 of 48 positions shown · IV contrast (gadavist)
Comparison: CT [DATE]

CLINICAL DATA: Indeterminate enhancing lesion in the liver CT exam.
Pancreatic cystic lesions identified. MRI recommended for further
characterization.

EXAM:
MRI ABDOMEN WITHOUT AND WITH CONTRAST
TECHNIQUE: Multiplanar multisequence MR imaging of the abdomen was performed
both before and after the administration of intravenous contrast.
CONTRAST:  10mL GADAVIST GADOBUTROL 1 MMOL/ML IV SOLN

[Series 3: T2 · coronal · 7.0mm · 1.72mm/px · 1 of 35 slices shown (1 of 2)]
[im 1/35]
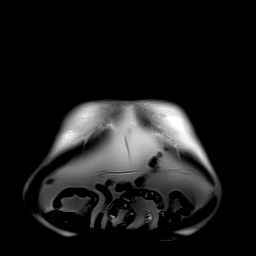

[Series 5: T2 fat-sat · axial · 6.0mm · 1.38mm/px · z∈[-227,+125]mm · 2 of 50 slices shown]
[im 1/50]
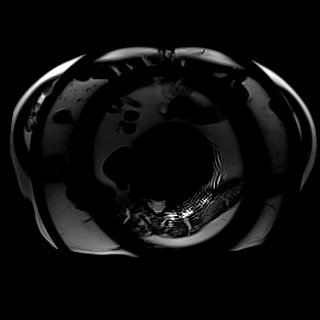
[im 50/50]
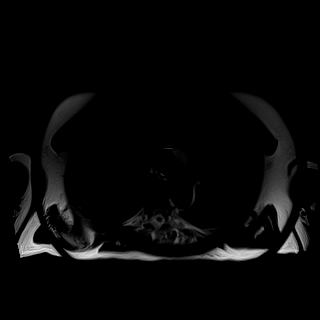

[Series 6: T1 · axial · 4.0mm · 1.38mm/px · z∈[-240,+108]mm · 3 of 88 slices shown (1 of 2)]
[im 1/88]
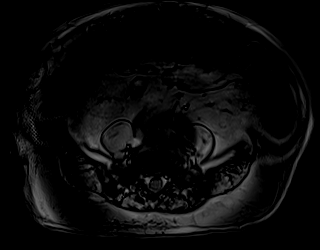
[im 44/88]
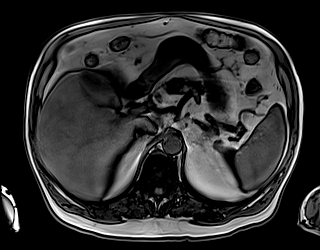
[im 88/88]
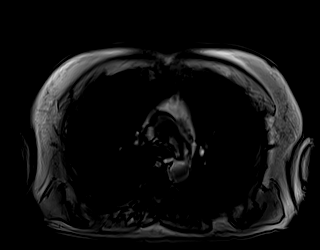

[Series 7: T1 · axial · 4.0mm · 1.38mm/px · z∈[-240,+108]mm · 3 of 88 slices shown (2 of 2)]
[im 1/88]
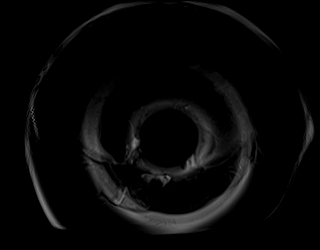
[im 44/88]
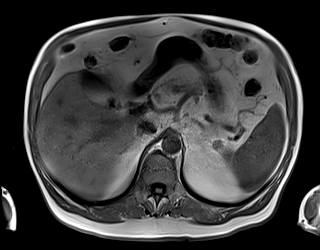
[im 88/88]
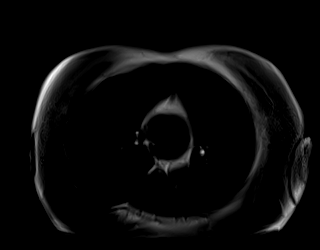

[Series 8: DWI · axial · 6.0mm · 1.64mm/px · z∈[-233,+91]mm · 3 of 92 slices shown (1 of 2)]
[im 1/92]
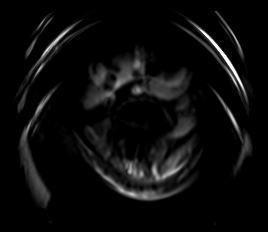
[im 46/92]
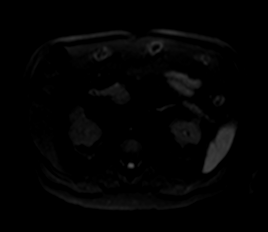
[im 92/92]
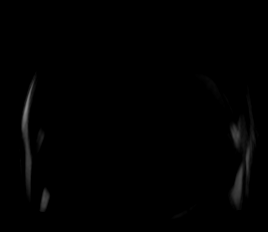

[Series 9: DWI · axial · 6.0mm · 1.64mm/px · 1 of 46 slices shown (2 of 2)]
[im 1/46]
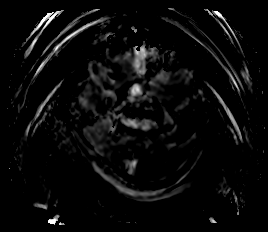

[Series 10: bSSFP · axial · 7.0mm · 1.38mm/px · 1 of 42 slices shown]
[im 1/42]
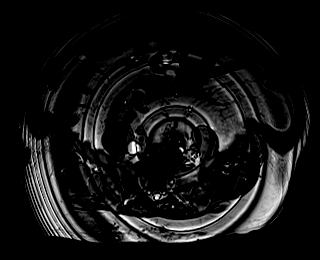

[Series 13: T1 dynamic · axial · 3.0mm · 1.38mm/px · z∈[-241,+68]mm · 3 of 104 slices shown (1 of 6)]
[im 1/104]
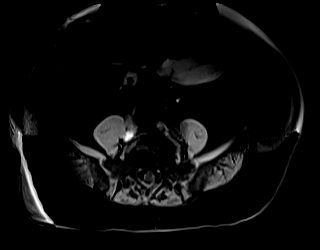
[im 52/104]
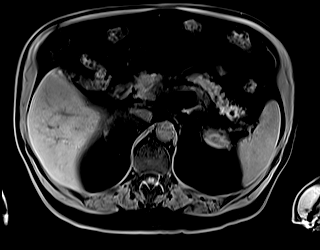
[im 104/104]
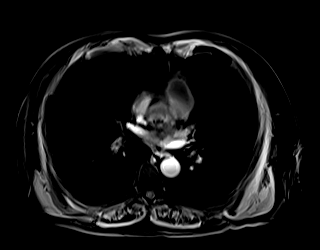

[Series 17: T1 dynamic · axial · 3.0mm · 1.38mm/px · z∈[-241,+68]mm · 3 of 104 slices shown (2 of 6)]
[im 1/104]
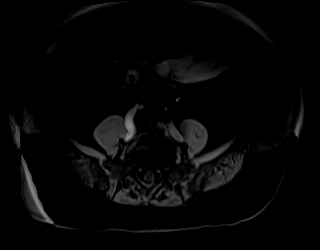
[im 52/104]
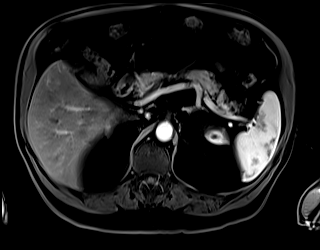
[im 104/104]
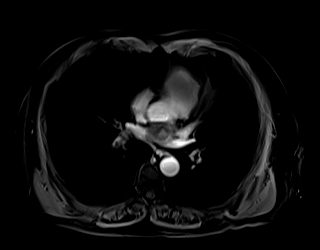

[Series 19: T1 dynamic · axial · 3.0mm · 1.38mm/px · z∈[-241,+68]mm · 3 of 104 slices shown (3 of 6)]
[im 1/104]
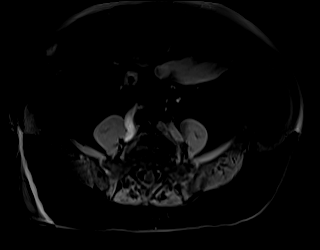
[im 52/104]
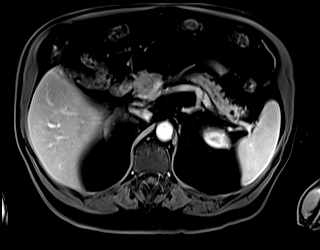
[im 104/104]
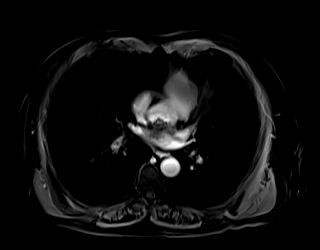

[Series 21: T1 dynamic · axial · 3.0mm · 1.38mm/px · z∈[-241,+68]mm · 3 of 104 slices shown (4 of 6)]
[im 1/104]
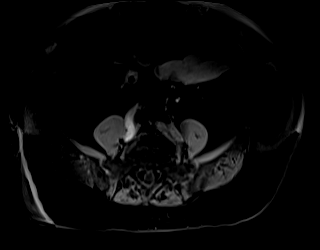
[im 52/104]
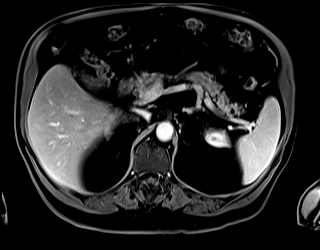
[im 104/104]
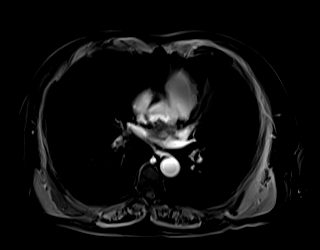

[Series 23: T1 dynamic · coronal · 5.0mm · 1.41mm/px · 2 of 56 slices shown (5 of 6)]
[im 1/56]
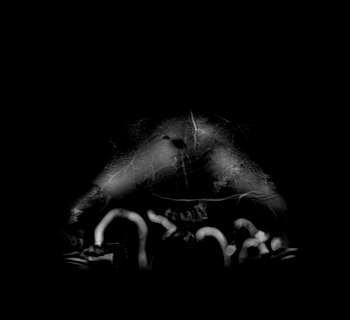
[im 56/56]
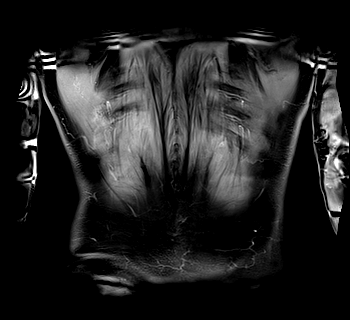

[Series 24: T2 · axial · 6.0mm · 1.72mm/px · z∈[-249,+97]mm · 2 of 49 slices shown (2 of 2)]
[im 1/49]
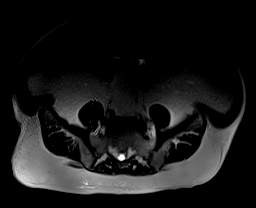
[im 49/49]
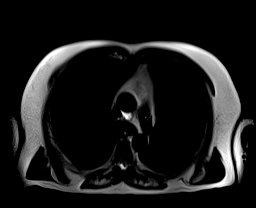

[Series 26: T1 dynamic · axial · 3.0mm · 1.38mm/px · z∈[-241,+68]mm · 3 of 104 slices shown (6 of 6)]
[im 1/104]
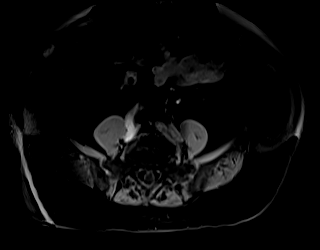
[im 52/104]
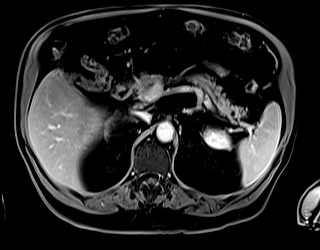
[im 104/104]
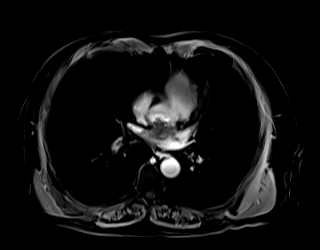

[Series 101: sub 20 sec · axial · 3.0mm · 1.38mm/px · z∈[-241,+68]mm · 3 of 104 slices shown]
[im 1/104]
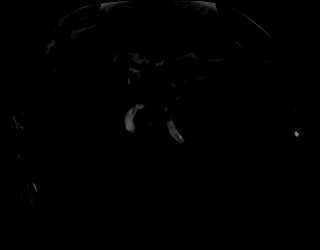
[im 52/104]
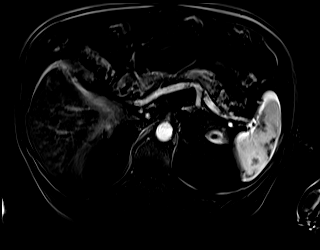
[im 104/104]
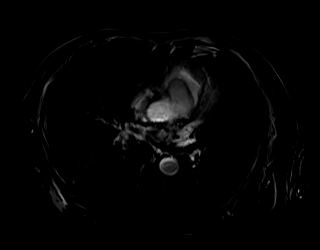

[Series 102: sub 45 sec · axial · 3.0mm · 1.38mm/px · z∈[-241,+68]mm · 3 of 104 slices shown]
[im 1/104]
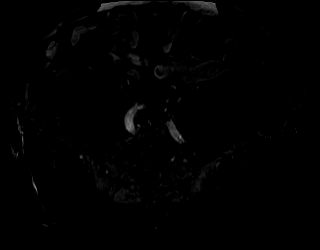
[im 52/104]
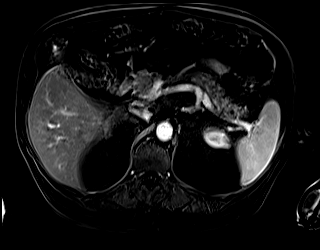
[im 104/104]
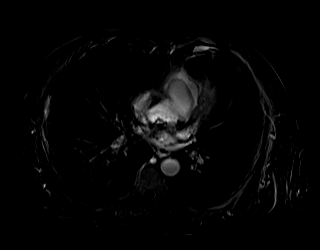

[Series 103: sub 90 sec · axial · 3.0mm · 1.38mm/px · z∈[-241,+68]mm · 3 of 104 slices shown]
[im 1/104]
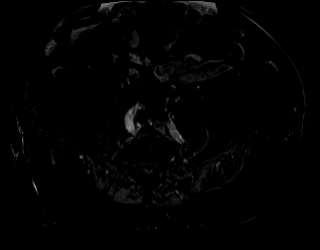
[im 52/104]
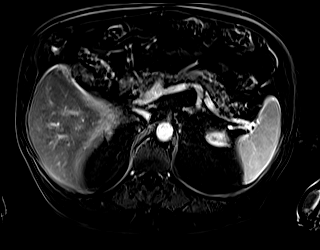
[im 104/104]
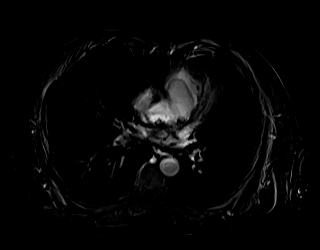

[Series 104: sub 3 min · axial · 3.0mm · 1.38mm/px · z∈[-241,+68]mm · 3 of 104 slices shown]
[im 1/104]
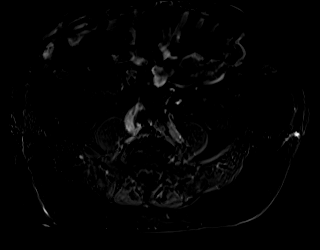
[im 52/104]
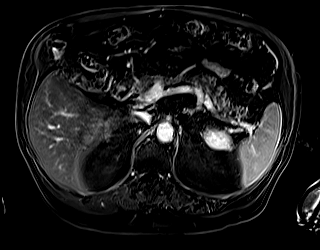
[im 104/104]
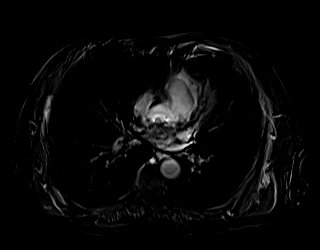

[45 of 48 positions shown; findings below may reference images not displayed]

FINDINGS: Lower chest:  Lung bases are clear.

Hepatobiliary: Angular lesion within the RIGHT hepatic lobe
demonstrates early vascular enhancement (image 49/series 17)
measuring 21 mm. This corresponds to the indeterminate lesion on
comparison CT. The lesion is isointense on the delayed sequences
with a small central central enhancing portion remaining which is
much smaller at 6 mm (image 48/series 21). Lesion subtly evident on
the noncontrast T2 weighted series (image 21/series 5).

No additional hepatic lesions. No hepatic steatosis. Normal biliary
tree. There is a gallstone within a nondistended gallbladder
measuring 10 mm. The common bile duct normal.

Pancreas: There are several cysts within the body and tail the
pancreas without communication with the pancreatic duct. The
pancreatic duct is normal caliber. The largest cyst is in the mid
body measuring 16 mm (image 15/series 3/coronal T2 imaging). This
cyst measures 13 mm on axial image (image 25/series 24). Other cysts
are considerably smaller. Again no communication with the duct.

The pancreatic cysts are unilocular without enhancement.

Spleen: Normal spleen.

Adrenals/urinary tract: Adrenal glands normal. Bilateral
nonenhancing renal cysts.

Stomach/Bowel: Stomach and limited of the small bowel is
unremarkable

Vascular/Lymphatic: Abdominal aortic normal caliber. No
retroperitoneal periportal lymphadenopathy.

Musculoskeletal: No aggressive osseous lesion
IMPRESSION: 1. Benign-appearing cystic lesion in the mid pancreas and several
additional small cystic lesions scattered throughout the pancreas.
Per consensus criteria, recommend follow-up MRI with and without
contrast in 6 months. This recommendation follows ACR consensus
guidelines: Management of Incidental Pancreatic Cysts: A White Paper
of the ACR Incidental Findings Committee. [HOSPITAL]
[[place]];[DATE].
2. Early enhancing lesion within the RIGHT hepatic lobe is favored
benign vascular phenomena or flash filling hemangioma. Favor
vascular phenomena such as portal venous shunt. Recommend follow-up
at the same time as the pancreatic cyst follow-up.
3. Bilateral benign nonenhancing renal cysts (Bosniak 1 and Bosniak
2).

## 2020-03-11 MED ORDER — GADOBUTROL 1 MMOL/ML IV SOLN
10.0000 mL | Freq: Once | INTRAVENOUS | Status: AC | PRN
  Administered 2020-03-11: 10 mL via INTRAVENOUS

## 2020-03-16 DIAGNOSIS — N400 Enlarged prostate without lower urinary tract symptoms: Secondary | ICD-10-CM | POA: Diagnosis not present

## 2020-03-16 DIAGNOSIS — M170 Bilateral primary osteoarthritis of knee: Secondary | ICD-10-CM | POA: Diagnosis not present

## 2020-03-16 DIAGNOSIS — I10 Essential (primary) hypertension: Secondary | ICD-10-CM | POA: Diagnosis not present

## 2020-03-16 DIAGNOSIS — E7800 Pure hypercholesterolemia, unspecified: Secondary | ICD-10-CM | POA: Diagnosis not present

## 2020-03-17 DIAGNOSIS — R269 Unspecified abnormalities of gait and mobility: Secondary | ICD-10-CM | POA: Diagnosis not present

## 2020-03-17 DIAGNOSIS — M1712 Unilateral primary osteoarthritis, left knee: Secondary | ICD-10-CM | POA: Diagnosis not present

## 2020-04-07 DIAGNOSIS — I10 Essential (primary) hypertension: Secondary | ICD-10-CM | POA: Diagnosis not present

## 2020-04-07 DIAGNOSIS — N400 Enlarged prostate without lower urinary tract symptoms: Secondary | ICD-10-CM | POA: Diagnosis not present

## 2020-04-07 DIAGNOSIS — M170 Bilateral primary osteoarthritis of knee: Secondary | ICD-10-CM | POA: Diagnosis not present

## 2020-04-07 DIAGNOSIS — E7800 Pure hypercholesterolemia, unspecified: Secondary | ICD-10-CM | POA: Diagnosis not present

## 2020-05-17 ENCOUNTER — Encounter: Payer: Medicare Other | Attending: Family Medicine | Admitting: Dietician

## 2020-05-17 DIAGNOSIS — R7303 Prediabetes: Secondary | ICD-10-CM | POA: Insufficient documentation

## 2020-05-17 NOTE — Progress Notes (Signed)
On 05/17/2020 patient completed Core Session 1 of Diabetes Prevention Program course virtually with [facility]. The following learning objectives were met by the patient during this class:   Virtual Visit via Video Note  I connected with Miguel [name], DOB: [birth year] by a video enabled application and verified that I am speaking with the correct person using two identifiers.  Location: Patient: Virtual Provider: Office    Learning Objectives:   Be able to explain the purpose and benefits of the National Diabetes Prevention Program.   Be able to describe the events that will take place at every session.   Know the weight loss and physical activity goals established by the [facility] Diabetes Prevention Program.   Know their own individual weight loss and physical activity goals.   Be able to explain the important effect of self-monitoring on behavior change.   Goals:  . Record food and beverage intake in "Food and Activity Tracker" over the next week.  . E-mail completed "Food and Activity Tracker" to Lifestyle Coach next week before session 2. . Circle the foods or beverages you think are highest in fat and calories in your food tracker. . Read the labels on the food you buy, and consider using measuring cups and spoons to help you calculate the amount you eat. We will talk about measuring in more detail in the coming weeks.   Follow-Up Plan:  Attend Core Session 2 next week.   E-mail completed "Food and Activity Tracker" to Lifestyle Coach next week before class.

## 2020-05-24 ENCOUNTER — Encounter (HOSPITAL_BASED_OUTPATIENT_CLINIC_OR_DEPARTMENT_OTHER): Payer: Medicare Other | Admitting: Dietician

## 2020-05-24 DIAGNOSIS — R7303 Prediabetes: Secondary | ICD-10-CM

## 2020-05-24 NOTE — Progress Notes (Signed)
On 05/24/2020 patient completed Core Session 2 of Diabetes Prevention Program course virtually with [facility]. The following learning objectives were met by the patient during this class:   I connected with Miguel [name]. [name][birth year] by a video enabled application and verified that I am speaking with the correct person using two identifiers.  Location: Patient: Virtual Provider: Office  Learning Objectives:  Self-monitor their weight during the weeks following Session 2.   Describe the relationship between fat and calories.   Explain the reason for, and basic principles of, self-monitoring fat grams and calories.   Identify their personal fat gram goals.   Use the ?Fat and Calorie Counter to calculate the calories and fat grams of a given selection of foods.   Keep a running total of the fat grams they eat each day.   Calculate fat, calories, and serving sizes from nutrition labels.   Goals:   Weigh yourself at the same time each day, or every few days, and record your weight in your Food and Activity Tracker.  Write down everything you eat and drink in your Food and Activity Tracker.  Measure portions as much as you can, and start reading labels.   Use the ?Fat and Calorie Counter to figure out the amount of fat and calories in what you ate, and write the amount down in your Food and Activity Tracker.  Keep a running fat gram total throughout the day. Come as close to your fat gram goal as you can.   Follow-Up Plan:  Attend Core Session 3 next week.   Email completed  "Food and Activity Tracker" to Lifestyle Coach next week.

## 2020-05-31 ENCOUNTER — Encounter (HOSPITAL_BASED_OUTPATIENT_CLINIC_OR_DEPARTMENT_OTHER): Payer: Medicare Other | Admitting: Dietician

## 2020-05-31 DIAGNOSIS — R7303 Prediabetes: Secondary | ICD-10-CM | POA: Diagnosis not present

## 2020-05-31 NOTE — Progress Notes (Signed)
On 05/31/2020 patient completed Core Session 3 of Diabetes Prevention Program course virtually with [facility]. The following learning objectives were met by the patient during this class:   I connected with Miguel [name] by a video enabled application and verified that I am speaking with the correct person using two identifiers.  Location: Patient: Virtual Provider: Office  Learning Objectives:  Weigh and measure foods.  Estimate the fat and calorie content of common foods.  Describe three ways to eat less fat and fewer calories.  Create a plan to eat less fat for the following week.   Goals:   Track weight when weighing outside of class.   Track food and beverages eaten each day in Food and Activity Tracker and include fat grams and calories for each.   Try to stay within fat gram goal.   Complete plan for eating less high fat foods and answer related homework questions.    Follow-Up Plan:  Attend Core Session 4 next week.   Bring completed "Food and Activity Tracker" next week to be reviewed by Lifestyle Coach.

## 2020-06-02 DIAGNOSIS — D225 Melanocytic nevi of trunk: Secondary | ICD-10-CM | POA: Diagnosis not present

## 2020-06-02 DIAGNOSIS — D485 Neoplasm of uncertain behavior of skin: Secondary | ICD-10-CM | POA: Diagnosis not present

## 2020-06-02 DIAGNOSIS — L820 Inflamed seborrheic keratosis: Secondary | ICD-10-CM | POA: Diagnosis not present

## 2020-06-02 DIAGNOSIS — L821 Other seborrheic keratosis: Secondary | ICD-10-CM | POA: Diagnosis not present

## 2020-06-02 DIAGNOSIS — L578 Other skin changes due to chronic exposure to nonionizing radiation: Secondary | ICD-10-CM | POA: Diagnosis not present

## 2020-06-07 ENCOUNTER — Encounter: Payer: Medicare Other | Attending: Family Medicine | Admitting: Dietician

## 2020-06-07 DIAGNOSIS — R7303 Prediabetes: Secondary | ICD-10-CM | POA: Insufficient documentation

## 2020-06-07 NOTE — Progress Notes (Signed)
On 06/08/2019 patient completed Core Session 4 of Diabetes Prevention Program course virtually with [facility]. The following learning objectives were met by the patient during this class:   I connected with Miguel Rummage. [name], [birth year] by a video enabled application and verified that I am speaking with the correct person using two identifiers.  Location: Patient: Virtual   Provider: Office  Learning Objectives:  Describe the MyPlate food guide and its recommendations, including how to reduce fat and calories in our diet.  Compare and contrast MyPlate guidelines with participants' eating habits.  List ways to replace high-fat and high-calorie foods with low-fat and low-calorie foods.  Explain the importance of eating plenty of whole grains, vegetables, and fruits, while staying within fat gram goals.  Explain the importance of eating foods from all groups of MyPlate and of eating a variety of foods from within each group.  Explain why a balanced diet is beneficial to health.  Goals:   Record weight taken outside of class.   Track foods and beverages eaten each day in the "Food and Activity Tracker," including calories and fat grams for each item.   Practice comparing what you eat with the recommendations of MyPlate using the "Rate Your Plate" handout.   Complete the "Rate Your Plate" handout form on at least 3 days.   Answer homework questions.   Follow-Up Plan:  Attend Core Session 5 next week.   Email completed "Food and Activity Tracker" next week to be reviewed by Lifestyle Coach.

## 2020-06-14 ENCOUNTER — Encounter (HOSPITAL_BASED_OUTPATIENT_CLINIC_OR_DEPARTMENT_OTHER): Payer: Medicare Other | Admitting: Dietician

## 2020-06-14 DIAGNOSIS — R7303 Prediabetes: Secondary | ICD-10-CM | POA: Diagnosis not present

## 2020-06-14 NOTE — Progress Notes (Signed)
On 06/14/2020 patient completed Core Session 5 of Diabetes Prevention Program course virtually with [facility]. The following learning objectives were met by the patient during this class:   I connected with Miguel Rummage. [name], 08-10-1950 by a video enabled application and verified that I am speaking with the correct person using two identifiers.  Location: Patient: Virtual  Provider: Office  Learning Objectives:  Establish a physical activity goal.  Explain the importance of the physical activity goal.  Describe their current level of physical activity.  Name ways that they are already physically active.  Develop personal plans for physical activity for the next week.   Goals:   Record weight taken outside of class.   Track foods and beverages eaten each day in the "Food and Activity Tracker," including calories and fat grams for each item.   Make an Activity Plan including date, specific type of activity, and length of time you plan to be active that includes at last 60 minutes of activity for the week.   Track activity type, minutes you were active, and distance you reached each day in the "Food and Activity Tracker."   Follow-Up Plan: . Attend Core Session 6 next week.  . E-mail completed "Food and Activity Tracker" to Lifestyle Coach next week before class

## 2020-06-28 ENCOUNTER — Encounter (HOSPITAL_BASED_OUTPATIENT_CLINIC_OR_DEPARTMENT_OTHER): Payer: Medicare Other | Admitting: Dietician

## 2020-06-28 DIAGNOSIS — E7800 Pure hypercholesterolemia, unspecified: Secondary | ICD-10-CM | POA: Diagnosis not present

## 2020-06-28 DIAGNOSIS — R7303 Prediabetes: Secondary | ICD-10-CM

## 2020-06-28 DIAGNOSIS — M170 Bilateral primary osteoarthritis of knee: Secondary | ICD-10-CM | POA: Diagnosis not present

## 2020-06-28 DIAGNOSIS — N400 Enlarged prostate without lower urinary tract symptoms: Secondary | ICD-10-CM | POA: Diagnosis not present

## 2020-06-28 DIAGNOSIS — I10 Essential (primary) hypertension: Secondary | ICD-10-CM | POA: Diagnosis not present

## 2020-06-28 NOTE — Progress Notes (Signed)
On 06/28/2020 patient completed Core Session 7 of Diabetes Prevention Program course virtually with [facility]. The following learning objectives were met by the patient during this class:   I connected with Miguel [name]. [name] June 18, 1950 by a video enabled application and verified that I am speaking with the correct person using two identifiers.  Location: Patient: Virtual Provider: Office  Learning Objectives:  Define calorie balance.  Explain how healthy eating and being active are related in terms of calorie balance.   Describe the relationship between calorie balance and weight loss.   Describe his or her progress as it relates to calorie balance.   Develop an activity plan for the coming week.   Goals:   Record weight taken outside of class.   Track foods and beverages eaten each day in the "Food and Activity Tracker," including calories and fat grams for each item.    Track activity type, minutes you were active, and distance you reached each day in the "Food and Activity Tracker."   Set aside one 20 to 30-minute block of time every day or find two or more periods of 10 to15 minutes each for physical activity.   Make a Physical Activities Plan for the Week.   Make active lifestyle choices all through the day   Stay at or go slightly over activity goal.   Follow-Up Plan:  Attend Core Session 8 next week.   E-mail completed "Food and Activity Tracker" to Lifestyle Coach next week before class

## 2020-07-05 ENCOUNTER — Encounter: Payer: Medicare Other | Attending: Family Medicine | Admitting: Dietician

## 2020-07-05 DIAGNOSIS — R7303 Prediabetes: Secondary | ICD-10-CM | POA: Diagnosis not present

## 2020-07-05 NOTE — Progress Notes (Signed)
On 07/05/2020 patient completed Core Session 8 of Diabetes Prevention Program course virtually with [facility]. The following learning objectives were met by the patient during this class:   I connected with Miguel [name]. Miguel [name], 1950-12-26 by a video enabled application and verified that I am speaking with the correct person using two identifiers.  Location: Patient: Virtual Provider: Office  Learning Objectives:  Recognize positive and negative food and activity cues.   Change negative food and activity cues to positive cues.   Add positive cues for activity and eliminate cues for inactivity.   Develop a plan for removing one problem food cue for the coming week.   Goals:   Record weight taken outside of class.   Track foods and beverages eaten each day in the "Food and Activity Tracker," including calories and fat grams for each item.    Track activity type, minutes you were active, and distance you reached each day in the "Food and Activity Tracker."   Set aside one 20 to 30-minute block of time every day or find two or more periods of 10 to15 minutes each for physical activity.   Remove one problem food cue.   Add one positive cue for being more active.  Follow-Up Plan: . Attend Core Session 9 next week.  . Email completed "Food and Activity Tracker" next week to be reviewed by Lifestyle Coach.

## 2020-07-15 ENCOUNTER — Encounter (HOSPITAL_BASED_OUTPATIENT_CLINIC_OR_DEPARTMENT_OTHER): Payer: Medicare Other | Admitting: Dietician

## 2020-07-15 ENCOUNTER — Other Ambulatory Visit: Payer: Self-pay

## 2020-07-15 DIAGNOSIS — R7303 Prediabetes: Secondary | ICD-10-CM

## 2020-07-15 NOTE — Progress Notes (Signed)
On 07/15/2020 patient completed Core Session 9 of Diabetes Prevention Program course virtually with [facility]. The following learning objectives were met by the patient during this class:   Virtual Visit via Video Note  I connected with Miguel [name]. [name], Sep 21, 1950 by a video enabled application and verified that I am speaking with the correct person using two identifiers.  Location: Patient: Virtual Provider: Office  Learning Objectives:  List and describe five steps to problem solving.   Apply the five problem solving steps to resolve a problem he or she has with eating less fat and fewer calories or being more active.   Goals:   Record weight taken outside of class.   Track foods and beverages eaten each day in the "Food and Activity Tracker," including calories and fat grams for each item.    Track activity type, minutes you were active, and distance you reached each day in the "Food and Activity Tracker."   Set aside one 20 to 30-minute block of time every day or find two or more periods of 10 to15 minutes each for physical activity.   Use problem solving action plan created during session to problem solve.   Follow-Up Plan:  Attend Core Session 10 next week.   Email completed "Food and Activity Tracker" next week to be reviewed by Lifestyle Coach.  Email menus from favorite restaurants to next session for future discussion.

## 2020-07-26 ENCOUNTER — Encounter (HOSPITAL_BASED_OUTPATIENT_CLINIC_OR_DEPARTMENT_OTHER): Payer: Medicare Other | Admitting: Dietician

## 2020-07-26 ENCOUNTER — Other Ambulatory Visit: Payer: Self-pay

## 2020-07-26 DIAGNOSIS — R7303 Prediabetes: Secondary | ICD-10-CM | POA: Diagnosis not present

## 2020-07-26 NOTE — Progress Notes (Signed)
On 07/26/2020 patient completed Core Session 10 of Diabetes Prevention Program course virtually with [facility]. The following learning objectives were met by the patient during this class:   Virtual Visit via Video Note  I connected with Miguel [name]. Miguel [name], [birth year] by a video enabled application and verified that I am speaking with the correct person using two identifiers.  Location: Patient: Virtual Provider: Office  Learning Objectives:  List and describe the four keys for healthy eating out.   Give examples of how to apply these keys at the type of restaurants that the participants go to regularly.   Make an appropriate meal selection from a restaurant menu.   Demonstrate how to ask for a substitute item using assertive language and a polite tone of voice.    Goals:   Record weight taken outside of class.   Track foods and beverages eaten each day in the "Food and Activity Tracker," including calories and fat grams for each item.    Track activity type, minutes you were active, and distance you reached each day in the "Food and Activity Tracker."   Set aside one 20 to 30-minute block of time every day or find two or more periods of 10 to15 minutes each for physical activity.   Utilize positive action plan and complete questions on "To Do List."   Follow-Up Plan:  Attend Core Session 11.   Email completed "Food and Activity Tracker" to be reviewed by Lifestyle Coach.

## 2020-07-29 DIAGNOSIS — M170 Bilateral primary osteoarthritis of knee: Secondary | ICD-10-CM | POA: Diagnosis not present

## 2020-07-29 DIAGNOSIS — I10 Essential (primary) hypertension: Secondary | ICD-10-CM | POA: Diagnosis not present

## 2020-07-29 DIAGNOSIS — E7800 Pure hypercholesterolemia, unspecified: Secondary | ICD-10-CM | POA: Diagnosis not present

## 2020-07-29 DIAGNOSIS — N400 Enlarged prostate without lower urinary tract symptoms: Secondary | ICD-10-CM | POA: Diagnosis not present

## 2020-08-02 ENCOUNTER — Other Ambulatory Visit: Payer: Self-pay

## 2020-08-02 ENCOUNTER — Encounter: Payer: Medicare (Managed Care) | Attending: Family Medicine | Admitting: Dietician

## 2020-08-02 DIAGNOSIS — R7303 Prediabetes: Secondary | ICD-10-CM | POA: Diagnosis not present

## 2020-08-02 NOTE — Progress Notes (Signed)
On 08/02/2020 patient completed Session 11 of Diabetes Prevention Program course virtually with [facility]. By the end of this session patients are able to complete the following objectives:   Virtual Visit via Video Note  I connected with Miguel [name]. [place][birth year] by a video enabled application and verified that I am speaking with the correct person using two identifiers.  Location: Patient: Virtual  Provider: Office  Learning Objectives:  Give examples of negative thoughts that could prevent them from meeting their goals of losing weight and being more physically active.   Describe how to stop negative thoughts and talk back to them with positive thoughts.   Practice 1) stopping negative thoughts and 2) talking back to negative thoughts with positive ones.    Goals:   Record weight taken outside of class.   Track foods and beverages eaten each day in the "Food and Activity Tracker," including calories and fat grams for each item.    Track activity type, minutes you were active, and distance you reached each day in the "Food and Activity Tracker."   If you have any negative thoughts-write them in your Food and Activity Trackers, along with how you talked back to them. Practice stopping negative thoughts and talking back to them with positive thoughts.   Follow-Up Plan:  Attend Core Session 12 next week.   Email completed "Food and Activity Tracker" before next week to be reviewed by Lifestyle Coach.

## 2020-08-09 ENCOUNTER — Other Ambulatory Visit: Payer: Self-pay

## 2020-08-09 ENCOUNTER — Encounter (HOSPITAL_BASED_OUTPATIENT_CLINIC_OR_DEPARTMENT_OTHER): Payer: Medicare (Managed Care) | Admitting: Dietician

## 2020-08-09 DIAGNOSIS — C44519 Basal cell carcinoma of skin of other part of trunk: Secondary | ICD-10-CM | POA: Diagnosis not present

## 2020-08-09 DIAGNOSIS — R7309 Other abnormal glucose: Secondary | ICD-10-CM | POA: Diagnosis not present

## 2020-08-09 DIAGNOSIS — M109 Gout, unspecified: Secondary | ICD-10-CM | POA: Diagnosis not present

## 2020-08-09 DIAGNOSIS — M170 Bilateral primary osteoarthritis of knee: Secondary | ICD-10-CM | POA: Diagnosis not present

## 2020-08-09 DIAGNOSIS — Z125 Encounter for screening for malignant neoplasm of prostate: Secondary | ICD-10-CM | POA: Diagnosis not present

## 2020-08-09 DIAGNOSIS — R7303 Prediabetes: Secondary | ICD-10-CM

## 2020-08-09 DIAGNOSIS — I10 Essential (primary) hypertension: Secondary | ICD-10-CM | POA: Diagnosis not present

## 2020-08-09 DIAGNOSIS — N400 Enlarged prostate without lower urinary tract symptoms: Secondary | ICD-10-CM | POA: Diagnosis not present

## 2020-08-09 DIAGNOSIS — G629 Polyneuropathy, unspecified: Secondary | ICD-10-CM | POA: Diagnosis not present

## 2020-08-09 DIAGNOSIS — Z1211 Encounter for screening for malignant neoplasm of colon: Secondary | ICD-10-CM | POA: Diagnosis not present

## 2020-08-09 DIAGNOSIS — E7800 Pure hypercholesterolemia, unspecified: Secondary | ICD-10-CM | POA: Diagnosis not present

## 2020-08-09 NOTE — Progress Notes (Signed)
Patient was seen on 08/09/2020 for the Core Session 12 of Diabetes Prevention Program course at [facility]. By the end of this session patients are able to complete the following objectives:   Virtual Visit via Video Note  I connected with Miguel Rummage. [name], 04/18/1951 on 08/09/20 at 10:30 AM EDT by a video enabled application and verified that I am speaking with the correct person using two identifiers.  Location: Patient: Virtual Provider: Office  Learning Objectives:  Describe their current progress toward defined goals.  Describe common causes for slipping from healthy eating or being  active.  Explain what to do to get back on their feet after a slip.  Goals:   Record weight taken outside of class.   Track foods and beverages eaten each day in the "Food and Activity Tracker," including calories and fat grams for each item.    Track activity type, minutes active, and distance reached each day in the "Food and Activity Tracker."   Try out the two action plans created during session- "Slips from Healthy Eating: Action Plan" and "Slips from Being Active: Action Plan"  Answer questions on the handout.   Follow-Up Plan:  Attend Core Session 13 next week.   Bring completed "Food and Activity Tracker" next week to be reviewed by Lifestyle Coach.

## 2020-08-11 DIAGNOSIS — N400 Enlarged prostate without lower urinary tract symptoms: Secondary | ICD-10-CM | POA: Diagnosis not present

## 2020-08-11 DIAGNOSIS — E7800 Pure hypercholesterolemia, unspecified: Secondary | ICD-10-CM | POA: Diagnosis not present

## 2020-08-11 DIAGNOSIS — I10 Essential (primary) hypertension: Secondary | ICD-10-CM | POA: Diagnosis not present

## 2020-08-11 DIAGNOSIS — M170 Bilateral primary osteoarthritis of knee: Secondary | ICD-10-CM | POA: Diagnosis not present

## 2020-08-12 DIAGNOSIS — G629 Polyneuropathy, unspecified: Secondary | ICD-10-CM | POA: Diagnosis not present

## 2020-08-12 DIAGNOSIS — Z23 Encounter for immunization: Secondary | ICD-10-CM | POA: Diagnosis not present

## 2020-08-12 DIAGNOSIS — R7303 Prediabetes: Secondary | ICD-10-CM | POA: Diagnosis not present

## 2020-08-12 DIAGNOSIS — R809 Proteinuria, unspecified: Secondary | ICD-10-CM | POA: Diagnosis not present

## 2020-08-12 DIAGNOSIS — E7800 Pure hypercholesterolemia, unspecified: Secondary | ICD-10-CM | POA: Diagnosis not present

## 2020-08-12 DIAGNOSIS — N400 Enlarged prostate without lower urinary tract symptoms: Secondary | ICD-10-CM | POA: Diagnosis not present

## 2020-08-12 DIAGNOSIS — I10 Essential (primary) hypertension: Secondary | ICD-10-CM | POA: Diagnosis not present

## 2020-08-16 ENCOUNTER — Encounter (HOSPITAL_BASED_OUTPATIENT_CLINIC_OR_DEPARTMENT_OTHER): Payer: Medicare (Managed Care) | Admitting: Dietician

## 2020-08-16 ENCOUNTER — Other Ambulatory Visit: Payer: Self-pay

## 2020-08-16 DIAGNOSIS — R7303 Prediabetes: Secondary | ICD-10-CM | POA: Diagnosis not present

## 2020-08-16 NOTE — Progress Notes (Signed)
On 08/16/2020 patient completed the Core Session 13 of Diabetes Prevention Program course virtually with [facility]. By the end of this session patients are able to complete the following objectives:   Virtual Visit via Video Note  I connected with@ on 08/16/20 at 10:30 AM EDT by a video enabled application and verified that I am speaking with the correct person using two identifiers.  Location: Patient: Virtual Provider: Office  Learning Objectives:  Describe ways to add interest and variety to their activity plans.  Define ?aerobic fitness.  Explain the four F.I.T.T. principles (frequency, intensity, time, and type of activity) and how they relate to aerobic fitness.   Goals:   Record weight taken outside of class.   Track foods and beverages eaten each day in the "Food and Activity Tracker," including calories and fat grams for each item.    Track activity type, minutes you were active, and distance you reached each day in the "Food and Activity Tracker."   Do your best to reach activity goal for the week.  Use one of the F.I.T.T. principles to jump start workouts.  Document activity level on the "To Do Next Week" handout.  Follow-Up Plan:  Attend Core Session 14 next week.   Email completed "Food and Activity Tracker" before next week to be reviewed by Lifestyle Coach.

## 2020-08-23 ENCOUNTER — Encounter (HOSPITAL_BASED_OUTPATIENT_CLINIC_OR_DEPARTMENT_OTHER): Payer: Medicare (Managed Care) | Admitting: Registered"

## 2020-08-23 ENCOUNTER — Encounter: Payer: Self-pay | Admitting: Registered"

## 2020-08-23 DIAGNOSIS — R7303 Prediabetes: Secondary | ICD-10-CM | POA: Diagnosis not present

## 2020-08-23 DIAGNOSIS — Z713 Dietary counseling and surveillance: Secondary | ICD-10-CM | POA: Diagnosis not present

## 2020-08-23 NOTE — Progress Notes (Signed)
On 08/23/20 patient completed Core Session 14 of Diabetes Prevention Program course virtually with [facility]. By the end of this session patients are able to complete the following objectives:   Virtual Visit via Video Note  I connected with Ace Gins on 08/23/20 at 10:30 AM EDT by a video enabled application and verified that I am speaking with the correct person using two identifiers.  Location: Patient: Home.  Provider: Office.   Learning Objectives:  Give examples of problem social cues and helpful social cues.   Explain how to remove problem social cues and add helpful ones.   Describe ways of coping with vacations and social events such as parties, holidays, and visits from relatives and friends.   Create an action plan to change a problem social cue and add a helpful one.   Goals:   Record weight taken outside of class.   Track foods and beverages eaten each day in the "Food and Activity Tracker," including calories and fat grams for each item.    Track activity type, minutes you were active, and distance you reached each day in the "Food and Activity Tracker."   Do your best to reach activity goal for the week.  Use action plan created during session to change a problem social cue and add a helpful social cue.   Answer questions regarding success of changing social cues on "To Do Next Week" handout.   Follow-Up Plan:  Attend Core Session 15 next week.   Email completed "Food and Activity Tracker" before next week to be reviewed by Lifestyle Coach.

## 2020-08-30 ENCOUNTER — Encounter: Payer: Self-pay | Admitting: Registered"

## 2020-08-30 ENCOUNTER — Encounter: Payer: Medicare (Managed Care) | Attending: Family Medicine | Admitting: Registered"

## 2020-08-30 DIAGNOSIS — R7303 Prediabetes: Secondary | ICD-10-CM | POA: Insufficient documentation

## 2020-08-30 DIAGNOSIS — Z713 Dietary counseling and surveillance: Secondary | ICD-10-CM

## 2020-08-30 NOTE — Progress Notes (Signed)
On 08/30/20 patient completed Core Session 15 of Diabetes Prevention Program course virtually with [facility]. By the end of this session patients are able to complete the following objectives:   Virtual Visit via Video Note  I connected with Miguel [name] on 08/30/20 at 10:30 AM EDT by a video enabled application and verified that I am speaking with the correct person using two identifiers.  Location: Patient: Home.  Provider: Office.   Learning Objectives:  Explain how to prevent stress or cope with unavoidable stress.   Describe how this program can be a source of stress.   Explain how to manage stressful situations.   Create and follow an action plan for either preventing or coping with a stressful situation.   Goals:   Record weight taken outside of class.   Track foods and beverages eaten each day in the "Food and Activity Tracker," including calories and fat grams for each item.    Track activity type, minutes you were active, and distance you reached each day in the "Food and Activity Tracker."   Do your best to reach activity goal for the week.  Follow your action plan to reduce stress.   Answer questions on handout regarding success of action plan.   Follow-Up Plan:  Attend Core Session 16 next week.   Email completed "Food and Activity Tracker" before next week to be reviewed by Lifestyle Coach.

## 2020-09-06 ENCOUNTER — Encounter (HOSPITAL_BASED_OUTPATIENT_CLINIC_OR_DEPARTMENT_OTHER): Payer: Medicare (Managed Care) | Admitting: Dietician

## 2020-09-06 ENCOUNTER — Other Ambulatory Visit: Payer: Self-pay

## 2020-09-06 DIAGNOSIS — R7303 Prediabetes: Secondary | ICD-10-CM

## 2020-09-06 NOTE — Progress Notes (Signed)
On 09/06/2020 patient completed Core Session 16 of Diabetes Prevention Program course virtually with [facility]. By the end of this session patients are able to complete the following objectives:   Virtual Visit via Video Note  I connected with Miguel [name]. Miguel [name], 1950-06-24 on 09/06/20 at 10:30 AM EDT by a video enabled application and verified that I am speaking with the correct person using two identifiers.  Location: Patient: Virtual  Provider: Office  Learning Objectives:  Measure their progress toward weight and physical activity goals since Session 1.   Develop a plan for improving progress, if their goals have not yet been attained.   Describe ways to stay motivated long-term.   Goals:   Record weight taken outside of class.   Track foods and beverages eaten each day in the "Food and Activity Tracker," including calories and fat grams for each item.    Track activity type, minutes you were active, and distance you reached each day in the "Food and Activity Tracker."   Utilize action plan to help stay motivated and complete questions on "To Do List."   Follow-Up Plan:  Attend session 17 in two weeks.   Email completed "Food and Activity Tracker" before next session to be reviewed by Lifestyle Coach.

## 2020-09-07 DIAGNOSIS — E785 Hyperlipidemia, unspecified: Secondary | ICD-10-CM | POA: Diagnosis not present

## 2020-09-07 DIAGNOSIS — N400 Enlarged prostate without lower urinary tract symptoms: Secondary | ICD-10-CM | POA: Diagnosis not present

## 2020-09-07 DIAGNOSIS — I10 Essential (primary) hypertension: Secondary | ICD-10-CM | POA: Diagnosis not present

## 2020-09-07 DIAGNOSIS — M170 Bilateral primary osteoarthritis of knee: Secondary | ICD-10-CM | POA: Diagnosis not present

## 2020-09-07 DIAGNOSIS — E7800 Pure hypercholesterolemia, unspecified: Secondary | ICD-10-CM | POA: Diagnosis not present

## 2020-09-20 ENCOUNTER — Other Ambulatory Visit: Payer: Self-pay

## 2020-09-20 ENCOUNTER — Encounter (HOSPITAL_BASED_OUTPATIENT_CLINIC_OR_DEPARTMENT_OTHER): Payer: Medicare (Managed Care) | Admitting: Dietician

## 2020-09-20 DIAGNOSIS — R7303 Prediabetes: Secondary | ICD-10-CM | POA: Diagnosis not present

## 2020-09-20 NOTE — Progress Notes (Signed)
On 09/20/2020 patient completed a post core session of the Diabetes Prevention Program course virtually with [facility]. By the end of this session patients are able to complete the following objectives:   Virtual Visit via Video Note  I connected with Miguel [name]. [place], 05-23-1950 by a video enabled application and verified that I am speaking with the correct person using two identifiers.  Location: Patient: Virtual Provider: Office  Learning Objectives:  Identify how to maintain and/or continue working toward program goals for the remainder of the program.   Describe ways that food and activity tracking can assist them in maintaining/reaching program goals.   Identify progress they have made since the beginning of the program.   Describe the differences between unsaturated, saturated, and trans fat on heart health.   List dietary sources of unsaturated, saturated, and trans fats.  Explain ways to reduce intake of saturated fat and replace them with heart healthy fats.  Goals:   Record weight taken outside of class.   Track foods and beverages eaten each day in the "Food and Activity Tracker," including calories and fat grams for each item.    Track activity type, minutes you were active, and distance you reached each day in the "Food and Activity Tracker."   Follow-Up Plan: . Attend next session.  . Email completed "Food and Activity Trackers" before next session to be reviewed by Lifestyle Coach.

## 2020-10-04 ENCOUNTER — Encounter: Payer: Medicare (Managed Care) | Attending: Family Medicine | Admitting: Dietician

## 2020-10-04 DIAGNOSIS — R7303 Prediabetes: Secondary | ICD-10-CM | POA: Diagnosis not present

## 2020-10-04 NOTE — Progress Notes (Signed)
On 10/04/2020 patient completed a post core session of the Diabetes Prevention Program course virtually with [facility]. By the end of this session patients are able to complete the following objectives:   Virtual Visit via Video Note  I connected with Miguel [name]. [name], [birth year] on 10/04/20 at 10:30 AM EDT by a video enabled application and verified that I am speaking with the correct person using two identifiers.  Location: Patient: Virtual Provider: Office  Learning Objectives:  List risk factors for heart disease.   Define the difference between HDL and LDL cholesterol  List ways to reduce risk for heart disease.   Goals:   Record weight taken outside of class.   Track foods and beverages eaten each day in the "Food and Activity Tracker," including calories and fat grams for each item.    Track activity type, minutes you were active, and distance you reached each day in the "Food and Activity Tracker."   Follow-Up Plan:  Attend next session.   Email completed "Food and Activity Trackers" before next session to be reviewed by Lifestyle Coach.

## 2020-10-18 ENCOUNTER — Encounter (HOSPITAL_BASED_OUTPATIENT_CLINIC_OR_DEPARTMENT_OTHER): Payer: Medicare (Managed Care) | Admitting: Dietician

## 2020-10-18 ENCOUNTER — Other Ambulatory Visit: Payer: Self-pay

## 2020-10-18 DIAGNOSIS — R7303 Prediabetes: Secondary | ICD-10-CM | POA: Diagnosis not present

## 2020-10-18 NOTE — Progress Notes (Signed)
On 10/18/2020 patient completed a post core session of the Diabetes Prevention Program course virtually with [facility]. By the end of this session patients are able to complete the following objectives:   I connected with [name]. [place], April 10, 1951 by a video enabled application and verified that I am speaking with the correct person using two identifiers.  Location: Patient: Virtual Provider: Office  Learning Objectives: Describe how to incorporate more fruits and vegetables into meals. List criteria for selecting good quality fruits and vegetables at the store.  Define mindful eating. List the benefits of eating mindfully.   Goals:  Record weight taken outside of class.  Track foods and beverages eaten each day in the "Food and Activity Tracker," including calories and fat grams for each item.   Track activity type, minutes you were active, and distance you reached each day in the "Food and Activity Tracker."   Follow-Up Plan: Attend next session.  Email completed "Food and Activity Trackers" before next session to be reviewed by Lifestyle Coach.

## 2020-10-25 DIAGNOSIS — E785 Hyperlipidemia, unspecified: Secondary | ICD-10-CM | POA: Diagnosis not present

## 2020-10-25 DIAGNOSIS — M170 Bilateral primary osteoarthritis of knee: Secondary | ICD-10-CM | POA: Diagnosis not present

## 2020-10-25 DIAGNOSIS — I10 Essential (primary) hypertension: Secondary | ICD-10-CM | POA: Diagnosis not present

## 2020-10-25 DIAGNOSIS — E7800 Pure hypercholesterolemia, unspecified: Secondary | ICD-10-CM | POA: Diagnosis not present

## 2020-10-25 DIAGNOSIS — N400 Enlarged prostate without lower urinary tract symptoms: Secondary | ICD-10-CM | POA: Diagnosis not present

## 2020-11-08 ENCOUNTER — Encounter: Payer: Medicare (Managed Care) | Attending: Family Medicine | Admitting: Dietician

## 2020-11-08 ENCOUNTER — Encounter: Payer: Self-pay | Admitting: Dietician

## 2020-11-08 ENCOUNTER — Other Ambulatory Visit: Payer: Self-pay

## 2020-11-08 DIAGNOSIS — R7303 Prediabetes: Secondary | ICD-10-CM | POA: Diagnosis not present

## 2020-11-08 NOTE — Progress Notes (Signed)
On 11/08/2020 patient completed the Diabetes Prevention Program course virtually with [facility]. By the end of this session patients are able to complete the following objectives:   Virtual Visit via Video Note  I connected with [name]. [place], [birth year] by a video enabled application and verified that I am speaking with the correct person using two identifiers.  Location: Patient: Virtual Provider: Office  Learning Objectives: Define fiber and describe the difference between insoluble and soluble fiber  List foods that are good sources of fiber Explain the health benefits of fiber  Describe ways to increase volume of meals and snacks while staying within fat goal.   Goals:  Record weight taken outside of class.  Track foods and beverages eaten each day in the "Food and Activity Tracker," including calories and fat grams for each item.   Track activity type, minutes you were active, and distance you reached each day in the "Food and Activity Tracker."   Follow-Up Plan: Attend next session.  Email completed "Food and Activity Trackers" before next session to be reviewed by Lifestyle Coach.

## 2020-11-22 ENCOUNTER — Encounter (HOSPITAL_BASED_OUTPATIENT_CLINIC_OR_DEPARTMENT_OTHER): Payer: Medicare (Managed Care) | Admitting: Dietician

## 2020-11-22 ENCOUNTER — Other Ambulatory Visit: Payer: Self-pay

## 2020-11-22 DIAGNOSIS — R7303 Prediabetes: Secondary | ICD-10-CM | POA: Diagnosis not present

## 2020-11-22 NOTE — Progress Notes (Signed)
On 11/22/2020 patient completed a post core session of the Diabetes Prevention Program course virtually with [facility]. By the end of this session patients are able to complete the following objectives:   Virtual Visit via Video Note  I connected with [name]. [place], [birth year] by a video enabled application and verified that I am speaking with the correct person using two identifiers.  Location: Patient: Virtual Provider: Office  Learning Objectives: List ways to make recipes healthier.  List lower-fat and lower-calorie substitutions for common ingredients.  Identify low-fat cooking methods.  Describe how to choose a cookbook that works best for their needs.   Goals:  Record weight taken outside of class.  Track foods and beverages eaten each day in the "Food and Activity Tracker," including calories and fat grams for each item.   Track activity type, minutes you were active, and distance you reached each day in the "Food and Activity Tracker."   Follow-Up Plan: Attend next session.  Email completed "Food and Activity Trackers" before next session to be reviewed by Lifestyle Coach.

## 2020-12-01 DIAGNOSIS — L821 Other seborrheic keratosis: Secondary | ICD-10-CM | POA: Diagnosis not present

## 2020-12-01 DIAGNOSIS — D485 Neoplasm of uncertain behavior of skin: Secondary | ICD-10-CM | POA: Diagnosis not present

## 2020-12-01 DIAGNOSIS — C44519 Basal cell carcinoma of skin of other part of trunk: Secondary | ICD-10-CM | POA: Diagnosis not present

## 2020-12-06 ENCOUNTER — Encounter: Payer: Medicare (Managed Care) | Attending: Family Medicine

## 2020-12-06 DIAGNOSIS — R7303 Prediabetes: Secondary | ICD-10-CM | POA: Insufficient documentation

## 2021-01-10 ENCOUNTER — Encounter: Payer: Medicare (Managed Care) | Attending: Family Medicine | Admitting: Dietician

## 2021-01-10 ENCOUNTER — Other Ambulatory Visit: Payer: Self-pay

## 2021-01-10 DIAGNOSIS — R7303 Prediabetes: Secondary | ICD-10-CM | POA: Diagnosis not present

## 2021-01-10 NOTE — Progress Notes (Signed)
On 01/10/2021 patient completed a post core session of the Diabetes Prevention Program virtually with [facility]. By the end of this session patients are able to complete the following objectives:   Virtual Visit via Video Note  I connected with [name]. [place], 1950/05/11 by a video enabled application and verified that I am speaking with the correct person using two identifiers.  Location: Patient: Virtual Provider: Office  Learning Objectives: List indoor physical activity options.  Identify any barriers to being active and brainstorm how to overcome barriers.  Describe short and long-term health benefits of physical activity.   Goals:  Record weight taken outside of class.  Track foods and beverages eaten each day in the "Food and Activity Tracker," including calories and fat grams for each item.   Track activity type, minutes you were active, and distance you reached each day in the "Food and Activity Tracker."   Follow-Up Plan: Attend next session.  Email completed "Food and Activity Trackers" before next session to be reviewed by Lifestyle Coach.

## 2021-01-28 DIAGNOSIS — E785 Hyperlipidemia, unspecified: Secondary | ICD-10-CM | POA: Diagnosis not present

## 2021-01-28 DIAGNOSIS — N400 Enlarged prostate without lower urinary tract symptoms: Secondary | ICD-10-CM | POA: Diagnosis not present

## 2021-01-28 DIAGNOSIS — M170 Bilateral primary osteoarthritis of knee: Secondary | ICD-10-CM | POA: Diagnosis not present

## 2021-01-28 DIAGNOSIS — I10 Essential (primary) hypertension: Secondary | ICD-10-CM | POA: Diagnosis not present

## 2021-01-28 DIAGNOSIS — E7800 Pure hypercholesterolemia, unspecified: Secondary | ICD-10-CM | POA: Diagnosis not present

## 2021-02-11 DIAGNOSIS — Z1389 Encounter for screening for other disorder: Secondary | ICD-10-CM | POA: Diagnosis not present

## 2021-02-11 DIAGNOSIS — Z0000 Encounter for general adult medical examination without abnormal findings: Secondary | ICD-10-CM | POA: Diagnosis not present

## 2021-02-14 ENCOUNTER — Encounter: Payer: Medicare (Managed Care) | Attending: Family Medicine | Admitting: Dietician

## 2021-02-14 ENCOUNTER — Other Ambulatory Visit: Payer: Self-pay

## 2021-02-14 DIAGNOSIS — R7303 Prediabetes: Secondary | ICD-10-CM | POA: Diagnosis not present

## 2021-02-14 NOTE — Progress Notes (Signed)
On 02/14/2021 patient completed a post core session of the Diabetes Prevention Program course virtually with [facility]. By the end of this session patients are able to complete the following objectives:   Virtual Visit via Video Note  I connected with Miguel Rummage.[name], December 12, 1950 by a video enabled application and verified that I am speaking with the correct person using two identifiers.  Location: Patient: Virtual  Provider: Office  Learning Objectives: Identify different types of stressors List effects of stress on health and healthy lifestyle choices Discuss how stress affects lifestyle choices  Name healthy ways to manage and avoid stressors   Goals:  Record weight taken outside of class.  Track foods and beverages eaten each day in the "Food and Activity Tracker," including calories and fat grams for each item.   Track activity type, minutes you were active, and distance you reached each day in the "Food and Activity Tracker."   Follow-Up Plan: Attend next session.  Email completed "Food and Activity Trackers" before next session to be reviewed by Lifestyle Coach.

## 2021-02-15 DIAGNOSIS — R809 Proteinuria, unspecified: Secondary | ICD-10-CM | POA: Diagnosis not present

## 2021-02-15 DIAGNOSIS — M79675 Pain in left toe(s): Secondary | ICD-10-CM | POA: Diagnosis not present

## 2021-02-15 DIAGNOSIS — R7309 Other abnormal glucose: Secondary | ICD-10-CM | POA: Diagnosis not present

## 2021-02-24 DIAGNOSIS — N400 Enlarged prostate without lower urinary tract symptoms: Secondary | ICD-10-CM | POA: Diagnosis not present

## 2021-02-24 DIAGNOSIS — G629 Polyneuropathy, unspecified: Secondary | ICD-10-CM | POA: Diagnosis not present

## 2021-02-24 DIAGNOSIS — I10 Essential (primary) hypertension: Secondary | ICD-10-CM | POA: Diagnosis not present

## 2021-02-24 DIAGNOSIS — Z125 Encounter for screening for malignant neoplasm of prostate: Secondary | ICD-10-CM | POA: Diagnosis not present

## 2021-02-24 DIAGNOSIS — N529 Male erectile dysfunction, unspecified: Secondary | ICD-10-CM | POA: Diagnosis not present

## 2021-02-24 DIAGNOSIS — L03032 Cellulitis of left toe: Secondary | ICD-10-CM | POA: Diagnosis not present

## 2021-02-24 DIAGNOSIS — E785 Hyperlipidemia, unspecified: Secondary | ICD-10-CM | POA: Diagnosis not present

## 2021-02-24 DIAGNOSIS — R7301 Impaired fasting glucose: Secondary | ICD-10-CM | POA: Diagnosis not present

## 2021-02-24 DIAGNOSIS — M79675 Pain in left toe(s): Secondary | ICD-10-CM | POA: Diagnosis not present

## 2021-02-24 DIAGNOSIS — R0989 Other specified symptoms and signs involving the circulatory and respiratory systems: Secondary | ICD-10-CM | POA: Diagnosis not present

## 2021-03-07 DIAGNOSIS — R600 Localized edema: Secondary | ICD-10-CM | POA: Diagnosis not present

## 2021-03-08 ENCOUNTER — Other Ambulatory Visit (HOSPITAL_COMMUNITY): Payer: Self-pay | Admitting: Family Medicine

## 2021-03-08 ENCOUNTER — Other Ambulatory Visit: Payer: Self-pay

## 2021-03-08 ENCOUNTER — Ambulatory Visit (HOSPITAL_COMMUNITY)
Admission: RE | Admit: 2021-03-08 | Discharge: 2021-03-08 | Disposition: A | Discharge status: Home / Self Care | Payer: Medicare (Managed Care) | Source: Ambulatory Visit | Attending: Family Medicine | Admitting: Family Medicine

## 2021-03-08 DIAGNOSIS — M79606 Pain in leg, unspecified: Secondary | ICD-10-CM | POA: Diagnosis present

## 2021-03-08 DIAGNOSIS — M7989 Other specified soft tissue disorders: Secondary | ICD-10-CM

## 2021-03-08 DIAGNOSIS — M79605 Pain in left leg: Secondary | ICD-10-CM

## 2021-03-10 ENCOUNTER — Ambulatory Visit (INDEPENDENT_AMBULATORY_CARE_PROVIDER_SITE_OTHER): Payer: Medicare (Managed Care)

## 2021-03-10 ENCOUNTER — Other Ambulatory Visit: Payer: Self-pay | Admitting: Podiatry

## 2021-03-10 ENCOUNTER — Ambulatory Visit: Payer: Medicare (Managed Care) | Admitting: Podiatry

## 2021-03-10 ENCOUNTER — Encounter: Payer: Self-pay | Admitting: Podiatry

## 2021-03-10 ENCOUNTER — Other Ambulatory Visit: Payer: Self-pay

## 2021-03-10 DIAGNOSIS — G629 Polyneuropathy, unspecified: Secondary | ICD-10-CM | POA: Insufficient documentation

## 2021-03-10 DIAGNOSIS — M7989 Other specified soft tissue disorders: Secondary | ICD-10-CM

## 2021-03-10 DIAGNOSIS — K769 Liver disease, unspecified: Secondary | ICD-10-CM | POA: Insufficient documentation

## 2021-03-10 DIAGNOSIS — M79672 Pain in left foot: Secondary | ICD-10-CM | POA: Diagnosis not present

## 2021-03-10 DIAGNOSIS — N400 Enlarged prostate without lower urinary tract symptoms: Secondary | ICD-10-CM | POA: Insufficient documentation

## 2021-03-10 DIAGNOSIS — R7301 Impaired fasting glucose: Secondary | ICD-10-CM | POA: Insufficient documentation

## 2021-03-10 DIAGNOSIS — E7800 Pure hypercholesterolemia, unspecified: Secondary | ICD-10-CM | POA: Insufficient documentation

## 2021-03-10 DIAGNOSIS — I10 Essential (primary) hypertension: Secondary | ICD-10-CM | POA: Insufficient documentation

## 2021-03-10 DIAGNOSIS — N529 Male erectile dysfunction, unspecified: Secondary | ICD-10-CM | POA: Insufficient documentation

## 2021-03-10 DIAGNOSIS — M7752 Other enthesopathy of left foot: Secondary | ICD-10-CM | POA: Diagnosis not present

## 2021-03-10 DIAGNOSIS — M25475 Effusion, left foot: Secondary | ICD-10-CM | POA: Diagnosis not present

## 2021-03-10 DIAGNOSIS — M170 Bilateral primary osteoarthritis of knee: Secondary | ICD-10-CM | POA: Insufficient documentation

## 2021-03-10 DIAGNOSIS — M109 Gout, unspecified: Secondary | ICD-10-CM | POA: Insufficient documentation

## 2021-03-10 IMAGING — DX DG FOOT COMPLETE 3+V*L*
3 series · 3 of 3 positions shown · non-contrast
Comparison: Left foot x-ray [DATE]

CLINICAL DATA: Foot swelling and pain

EXAM:
LEFT FOOT - COMPLETE 3+ VIEW

[foot ap wb]
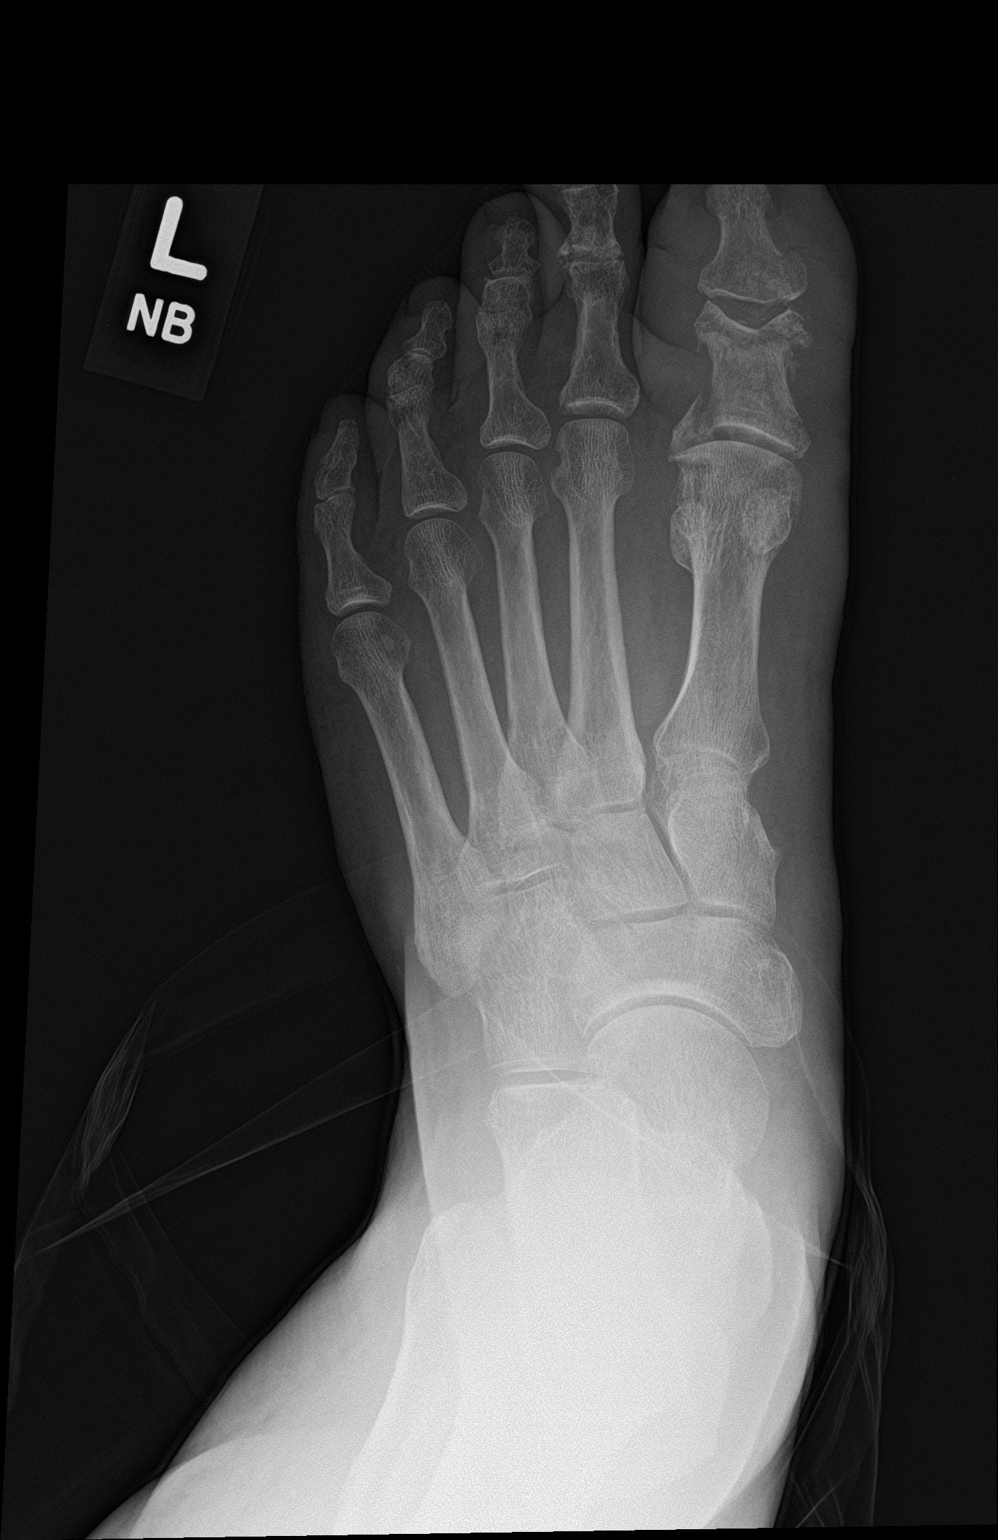

[foot obl wb]
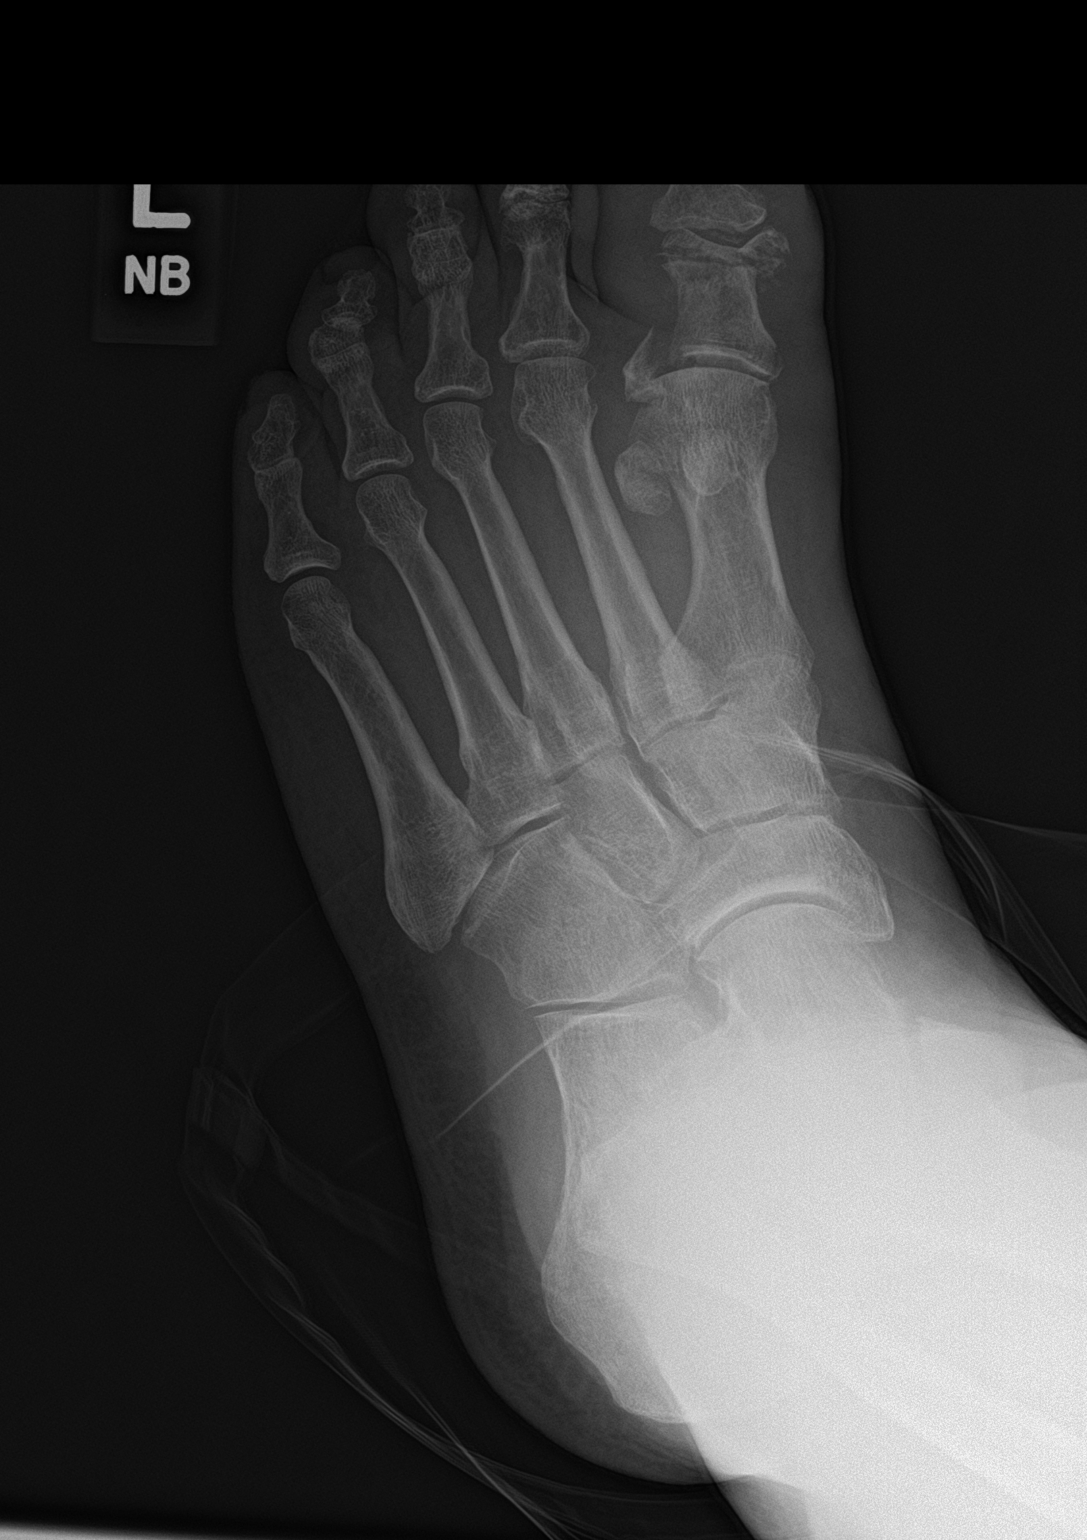

[foot lat wb]
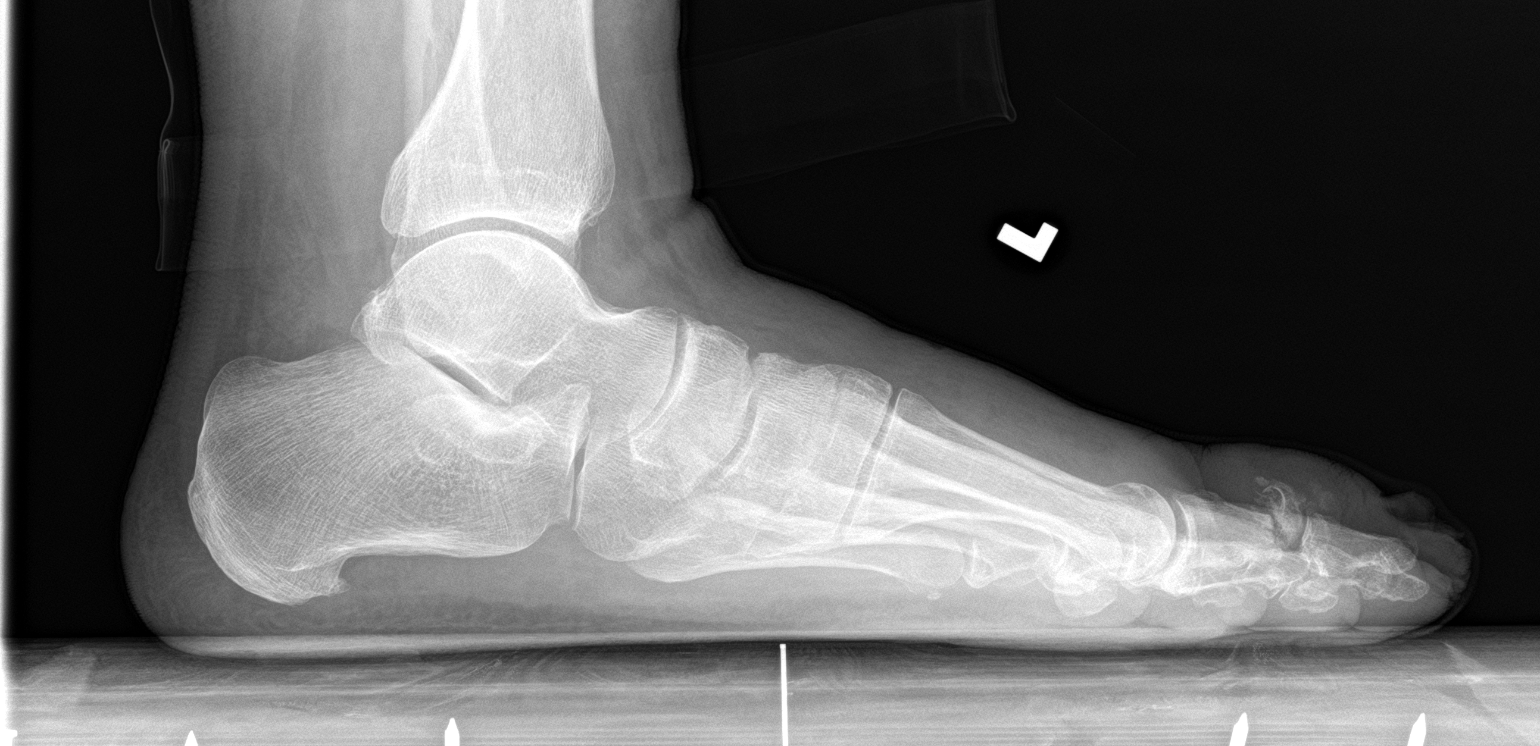

[3 of 3 positions shown; findings below may reference images not displayed]

FINDINGS: There is acute to subacute comminuted fracture of the first proximal
phalanx with fracture lines involving the proximal and distal
articular surfaces and displacement of fragments up to 8 mm. No
additional fracture identified. No dislocation. Degenerative changes
at the second proximal interphalangeal joint similar to previous.
Plantar calcaneal spur. Soft tissue swelling including severe soft
tissue swelling of the first toe.
IMPRESSION: Acute to subacute appearing extensive comminuted fracture of the
first proximal phalanx. Severe soft tissue swelling of the first
toe.

## 2021-03-10 MED ORDER — COLCHICINE 0.6 MG PO TABS: 0.6000 mg | ORAL_TABLET | Freq: Every day | ORAL | 0 refills | Status: DC

## 2021-03-10 NOTE — Progress Notes (Signed)
  Subjective:  Patient ID: Miguel [name], male    DOB: 1951-01-01,   MRN: [id number]  Chief Complaint  Patient presents with   Nail Problem    Lt hallux pain, swelling, redness and warmth x 2 wks - pt denies injury - 4-5/10 occasional pain - pt was seen at [facility] and was rx'd naproxen 1 wk later pt was seen at PCP and was dz'd with infection and Rx'd doxycycline (completed) tx: none    70 y.o. male presents for concern of pain in left great toe and right second toe that has been present for about 3 weeks. Relates redness, swelling and warmth. Denies any openings or drainage. Currently taking doxycycline that he got from urgent care that did not help. Does have a history of gout in the other foot. States it felt like gout to him.  Marland Kitchen He is pre-diabetic and last A1c was 6.2. Denies any other pedal complaints. Denies n/v/f/c.   History reviewed. No pertinent past medical history.  Objective:  Physical Exam: Vascular: DP/PT pulses 2/4 bilateral. CFT <3 seconds. Normal hair growth on digits. No edema.  Skin. No lacerations or abrasions bilateral feet. Erythema and edema noted to right hallux. Nails 1-5 are thickened discolored and elongated with subungual debris. No open wounds noted.  Musculoskeletal: MMT 5/5 bilateral lower extremities in DF, PF, Inversion and Eversion. Deceased ROM in DF of ankle joint. Tender to left hallux IPJ. Minimal pain with ROM.  Neurological: Sensation intact to light touch.   Assessment:   1. Toe swelling   2. Capsulitis of toe of left foot      Plan:  Patient was evaluated and treated and all questions answered. -Xrays reviewed X-rays reviewed and discussed with patient. Acute to subacute appearing extensive comminuted fracture of the first proximal phalanx. Severe soft tissue swelling of the first toe. -Discussed treatement options for gouty arthritis and gout education provided. -Discussed diet and modifications.  -Rx Colchicine 0.6mg  -Ordered arthritic lab  panel; will call patient with results if abnormal -Advised patient to call if symptoms are not improved within 1 week -Patient to return in 3 weeks for re-check/further discussion for long term management of gout or sooner if condition worsens.   Lorenda Peck, DPM

## 2021-03-10 NOTE — Telephone Encounter (Signed)
Patient is request 90 day supply

## 2021-03-11 ENCOUNTER — Other Ambulatory Visit: Payer: Self-pay | Admitting: Podiatry

## 2021-03-11 ENCOUNTER — Telehealth: Payer: Self-pay | Admitting: Podiatry

## 2021-03-11 LAB — URIC ACID: Uric Acid, Serum: 7 mg/dL (ref 4.0–8.0)

## 2021-03-11 MED ORDER — COLCHICINE 0.6 MG PO TABS: ORAL_TABLET | ORAL | 2 refills | Status: DC

## 2021-03-11 NOTE — Telephone Encounter (Signed)
Please send the prescription for colchicine 0.6 MG tablet to [facility] in [place], it is cheaper for the patient to get prescription there.

## 2021-03-14 ENCOUNTER — Telehealth: Payer: Self-pay | Admitting: *Deleted

## 2021-03-14 NOTE — Telephone Encounter (Signed)
Patient is calling to request Blood work (uric acid)results from 03/10/21. Please advise.

## 2021-03-15 NOTE — Telephone Encounter (Signed)
Returned the call to patient, no answer, left vmessage for call back to give lab results

## 2021-03-15 NOTE — Telephone Encounter (Signed)
Patient wants to know if he can discontinue taking the colchicine 0.6 MG tablet since his labs came back in the normal range

## 2021-03-15 NOTE — Telephone Encounter (Signed)
Returned the call to patient giving test results per Dr Blenda Mounts and to come in to pick up a shoe for the fracture.He verbalized understanding.

## 2021-03-16 NOTE — Telephone Encounter (Signed)
Left message for patient on answering machine.  With office number

## 2021-03-18 ENCOUNTER — Telehealth: Payer: Self-pay | Admitting: *Deleted

## 2021-03-18 NOTE — Telephone Encounter (Signed)
Patient stopped by the [facility] office today and wanted to make sure that the shoe that he has was ok and the surgical shoe is one that the patient had from home and I stated that it was and per Dr Blenda Mounts could stop the colchicine and to keep the rest of the medicine on hand just in case the patient needs to take it again and to call the office if any concerns or questions. Miguel [name]

## 2021-03-21 ENCOUNTER — Encounter: Payer: Medicare (Managed Care) | Attending: Family Medicine | Admitting: Dietician

## 2021-03-21 ENCOUNTER — Other Ambulatory Visit: Payer: Self-pay

## 2021-03-21 DIAGNOSIS — R7303 Prediabetes: Secondary | ICD-10-CM

## 2021-03-21 NOTE — Progress Notes (Signed)
On 03/21/2021 patient completed a post core session of the Diabetes Prevention Program course virtually with [facility]. By the end of this session patients are able to complete the following objectives:   Virtual Visit via Video Note  I connected with [name]. [place][birth year] by a video enabled application and verified that I am speaking with the correct person using two identifiers.  Location: Patient: Virtual Provider: Office  Learning Objectives: List 9 ways to to stay on track during special events/occasions. Create a plan to avoid a food problem at an upcoming special event/occasion Describe ways to make time for healthy behaviors during special events/occasions   Goals:  Record weight taken outside of class.  Track foods and beverages eaten each day in the "Food and Activity Tracker," including calories and fat grams for each item.   Track activity type, minutes you were active, and distance you reached each day in the "Food and Activity Tracker."   Follow-Up Plan: Attend next session.  Email completed "Food and Activity Trackers" before next session to be reviewed by Lifestyle Coach.

## 2021-03-30 NOTE — Telephone Encounter (Signed)
Error

## 2021-04-08 ENCOUNTER — Encounter: Payer: Self-pay | Admitting: Podiatry

## 2021-04-08 ENCOUNTER — Other Ambulatory Visit: Payer: Self-pay

## 2021-04-08 ENCOUNTER — Ambulatory Visit (INDEPENDENT_AMBULATORY_CARE_PROVIDER_SITE_OTHER): Payer: Medicare (Managed Care) | Admitting: Podiatry

## 2021-04-08 ENCOUNTER — Ambulatory Visit (INDEPENDENT_AMBULATORY_CARE_PROVIDER_SITE_OTHER): Payer: Medicare (Managed Care)

## 2021-04-08 DIAGNOSIS — L97511 Non-pressure chronic ulcer of other part of right foot limited to breakdown of skin: Secondary | ICD-10-CM | POA: Diagnosis not present

## 2021-04-08 DIAGNOSIS — S92415D Nondisplaced fracture of proximal phalanx of left great toe, subsequent encounter for fracture with routine healing: Secondary | ICD-10-CM | POA: Diagnosis not present

## 2021-04-08 IMAGING — DX DG FOOT COMPLETE 3+V*L*
3 series · 3 of 3 positions shown · non-contrast
Comparison: Left foot x-ray [DATE].

CLINICAL DATA: Left great toe fracture.

EXAM:
LEFT FOOT - COMPLETE 3+ VIEW

[foot ap wb]
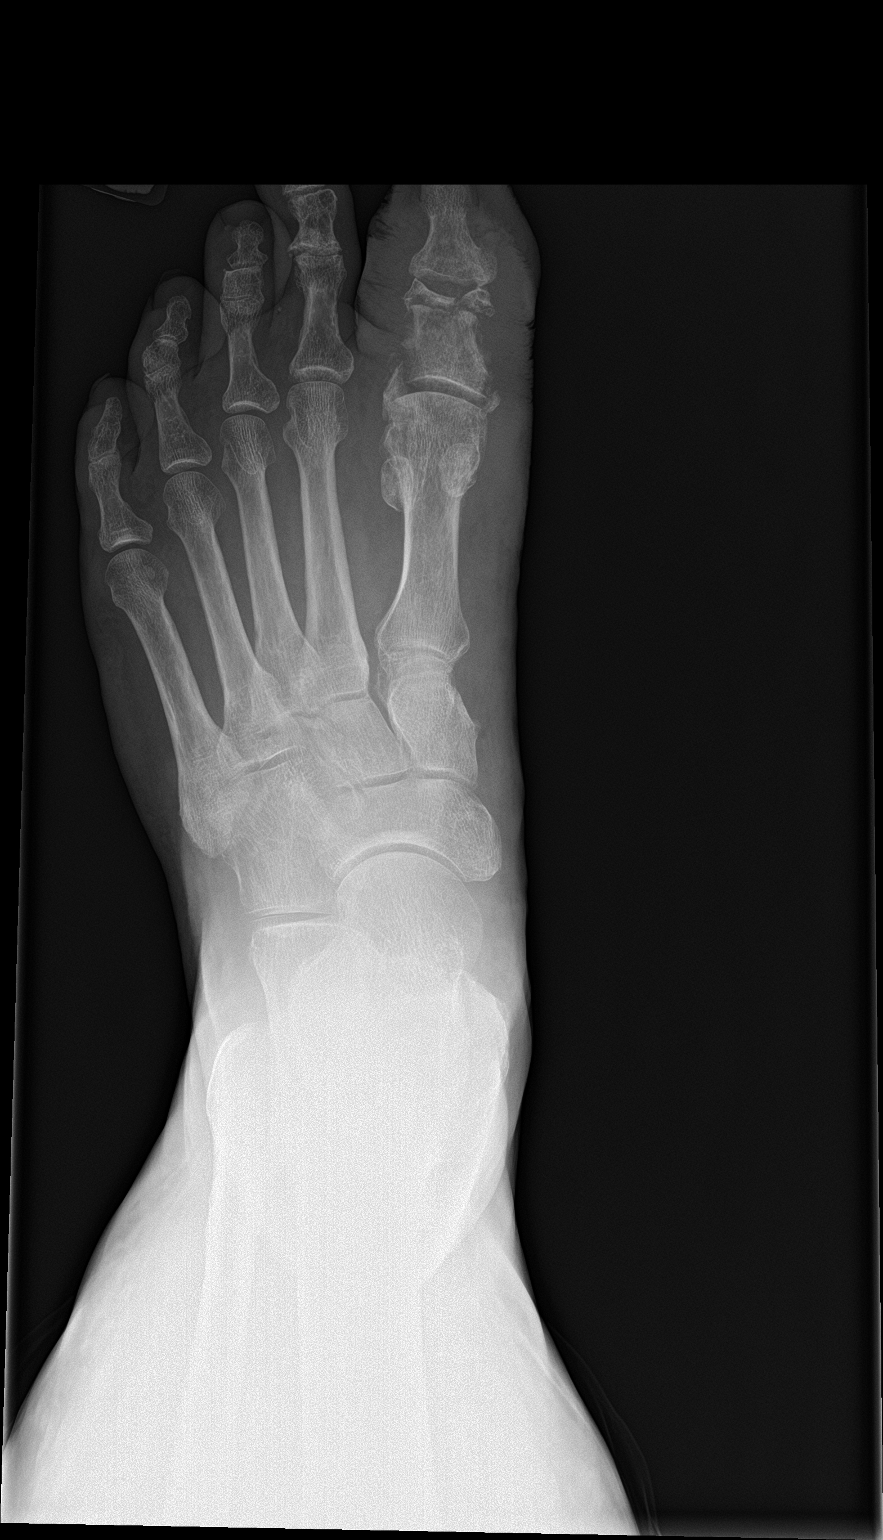

[foot obl wb]
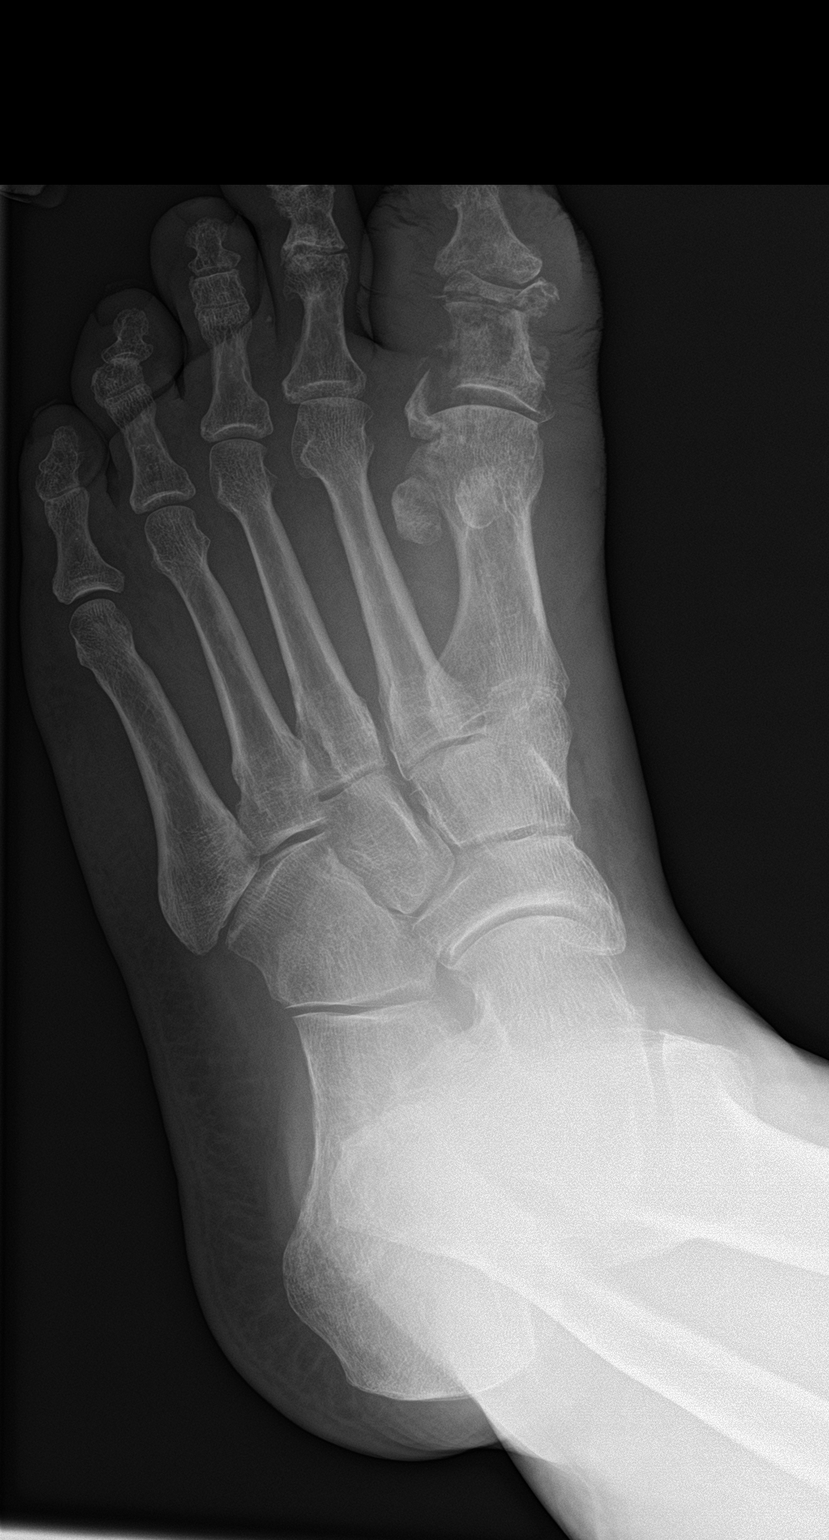

[foot lat wb]
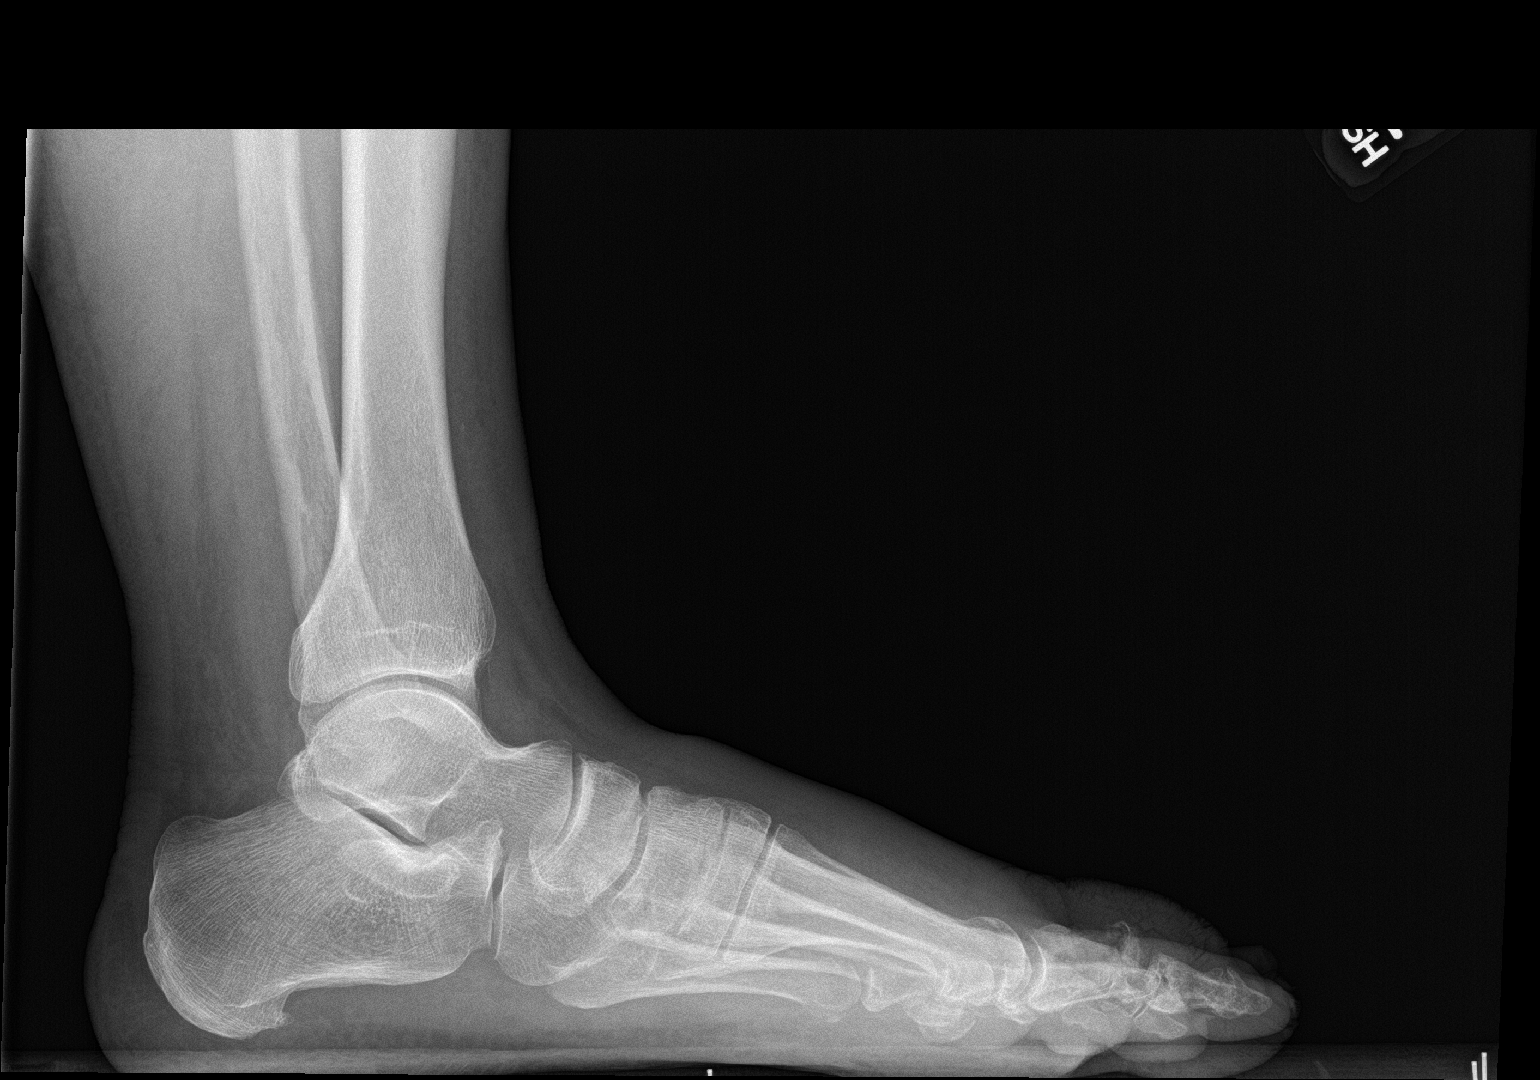

[3 of 3 positions shown; findings below may reference images not displayed]

FINDINGS: Again seen is a comminuted fracture involving the proximal and
distal aspects of the first proximal phalanx. Minimal callus
formation is present, new from prior study. Alignment is unchanged,
fracture fragments are mildly distracted at the first
metatarsophalangeal joint. Fracture lines are still present. There
is soft tissue swelling of the first toe. There is no dislocation.
IMPRESSION: 1. Subacute extensive fractures involving the first proximal
phalanx. There are minimal changes of healing. Alignment is stable.
2. Continued soft tissue swelling.

## 2021-04-08 NOTE — Progress Notes (Signed)
  Subjective:  Patient ID: Miguel [name], male    DOB: 05-Jan-1951,   MRN: [id number]  Chief Complaint  Patient presents with   Follow-up    Left foot great toe fracture patient stated pain is gone but there is swelling that is still there. He states he can not tell the difference wearing his surgical shoe versus wearing a regular shoes. Patient also states right foot second toe skin is separating and not healing he wanting to know if we can suture it back together.      70 y.o. male presents for follow-up of left great toe pain. Relates the pain has improved and currently has not pain. Does relate continued swelling. Has been wearing the surgical shoe as instructed.  Denies any redness or warmth. Denies any openings or drainage.  Does have a history of gout in the other foot . He is pre-diabetic and last A1c was 6.2. Denies any other pedal complaints. Denies n/v/f/c.   No past medical history on file.  Objective:  Physical Exam: Vascular: DP/PT pulses 2/4 bilateral. CFT <3 seconds. Normal hair growth on digits. No edema.  Skin. No lacerations or abrasions bilateral feet.Left hallux erythema improved some edema still noted. Right second digit with hyperkeratotic tissue and large fissure at distal tip.  Nails 1-5 are thickened discolored and elongated with subungual debris. No open wounds noted.  Musculoskeletal: MMT 5/5 bilateral lower extremities in DF, PF, Inversion and Eversion. Deceased ROM in DF of ankle joint. Tender to left hallux IPJ. Minimal pain with ROM.  Neurological: Sensation intact to light touch.   Assessment:   1. Closed nondisplaced fracture of proximal phalanx of left great toe with routine healing, subsequent encounter   2. Skin ulcer of second toe of right foot, limited to breakdown of skin ([place])       Plan:  Patient was evaluated and treated and all questions answered. -Xrays ordered on way out.  -Discussed treatement options for toe fracture; risks, alternatives,  and benefits explained. -Continue in  surgical shoe. -Recommend protection, rest, ice, elevation daily until symptoms improve -Rx pain med/antinflammatories as needed -Hyperkeratotic tissue from right foot debrided. Did have superficial area of bleeding in fissured area with small 0.1 cm wound. Ulcer right second digit  -Debridement as below. -Dressed with neosporin steri strips. , DSD. -No abx indicated.  -Discussed glucose control and proper protein-rich diet.  -Discussed if any worsening redness, pain, fever or chills to call or may need to report to the emergency room. Patient expressed understanding.   Procedure: Excisional Debridement of Wound Rationale: Removal of non-viable soft tissue from the wound to promote healing.  Anesthesia: none Pre-Debridement Wound Measurements: overlying callus.  Post-Debridement Wound Measurements: 0.1 cm x 0.1 cm x 0.1 cm  Type of Debridement: Sharp Excisional Tissue Removed: Non-viable soft tissue Depth of Debridement: subcutaneous tissue. Technique: Sharp excisional debridement to bleeding, viable wound base.  Dressing: Dry, sterile, compression dressing. Disposition: Patient tolerated procedure well. Patient to return in 1 week for follow-up.  Return in about 1 week (around 04/15/2021) for wound check.  -Will return in one week for re-evaluation.    Lorenda Peck, DPM

## 2021-04-15 ENCOUNTER — Ambulatory Visit (INDEPENDENT_AMBULATORY_CARE_PROVIDER_SITE_OTHER): Payer: Medicare (Managed Care)

## 2021-04-15 ENCOUNTER — Other Ambulatory Visit: Payer: Self-pay

## 2021-04-15 ENCOUNTER — Ambulatory Visit (INDEPENDENT_AMBULATORY_CARE_PROVIDER_SITE_OTHER): Payer: Medicare (Managed Care) | Admitting: Podiatry

## 2021-04-15 DIAGNOSIS — L97511 Non-pressure chronic ulcer of other part of right foot limited to breakdown of skin: Secondary | ICD-10-CM

## 2021-04-15 DIAGNOSIS — M7989 Other specified soft tissue disorders: Secondary | ICD-10-CM | POA: Diagnosis not present

## 2021-04-15 DIAGNOSIS — S92415D Nondisplaced fracture of proximal phalanx of left great toe, subsequent encounter for fracture with routine healing: Secondary | ICD-10-CM

## 2021-04-15 IMAGING — DX DG FOOT COMPLETE 3+V*R*
3 series · 3 of 3 positions shown · non-contrast
Comparison: None.

CLINICAL DATA: Right toe ulceration, redness

EXAM:
RIGHT FOOT COMPLETE - 3+ VIEW

[foot ap wb]
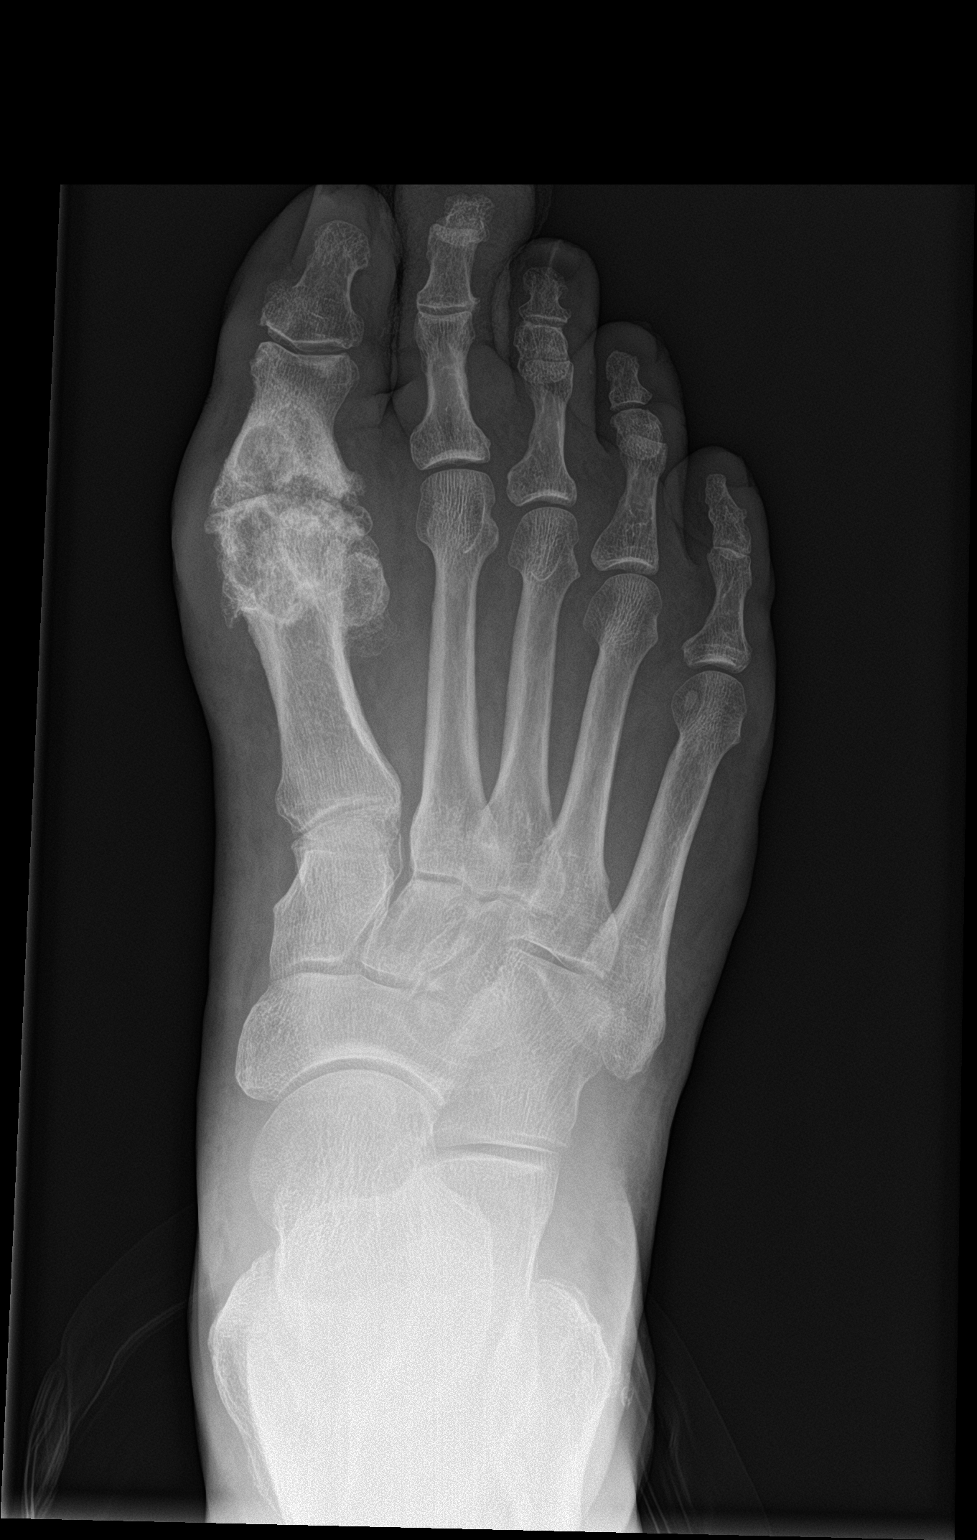

[foot obl wb]
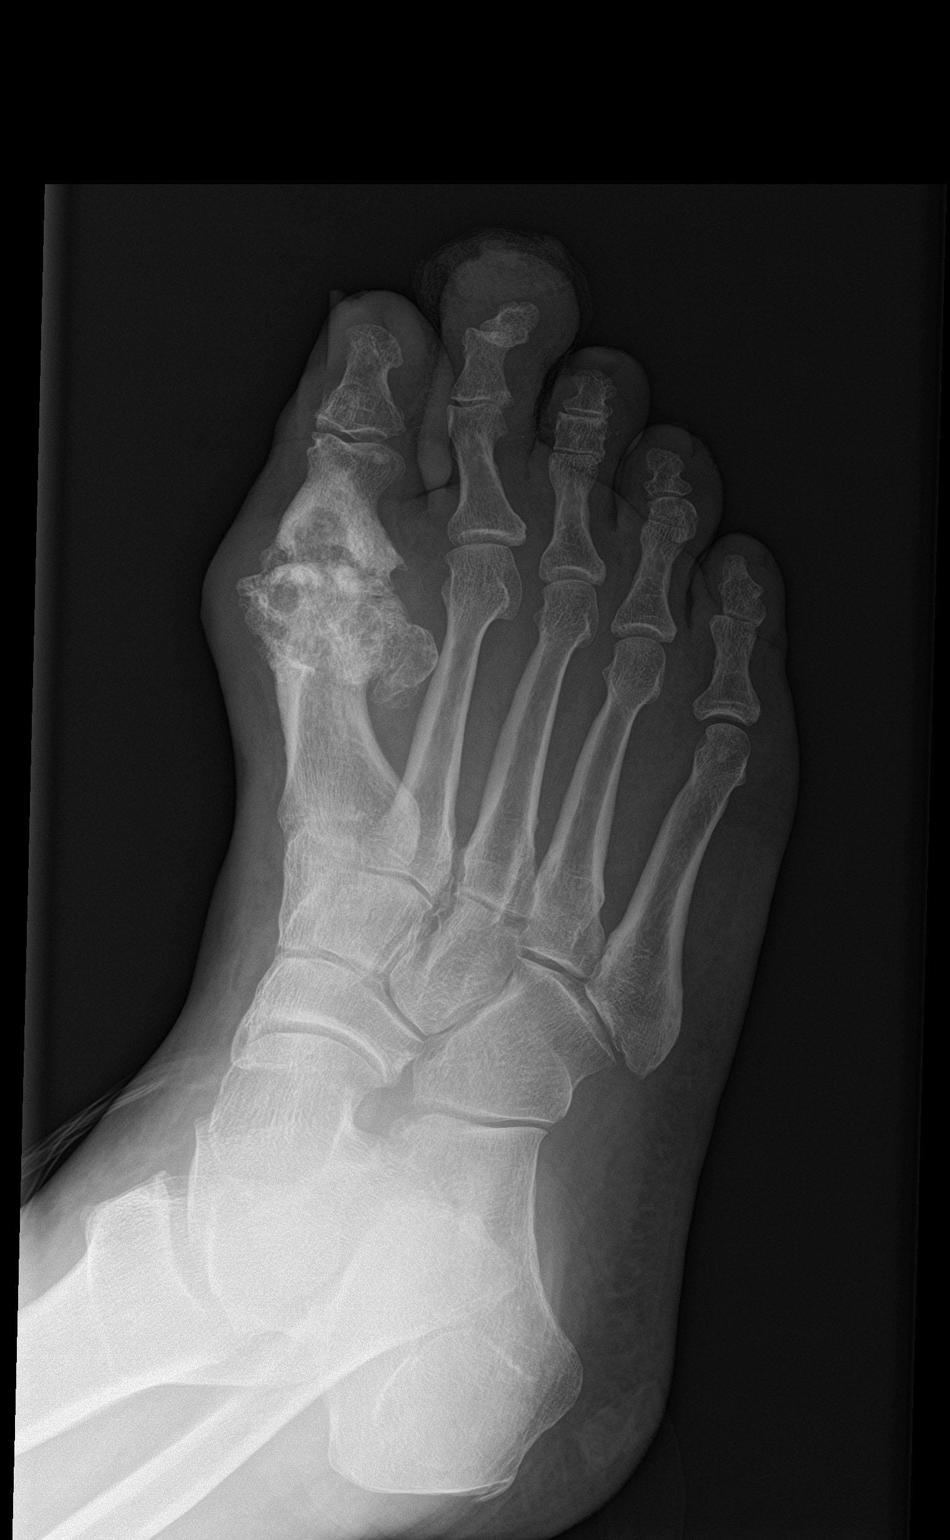

[foot lat wb]
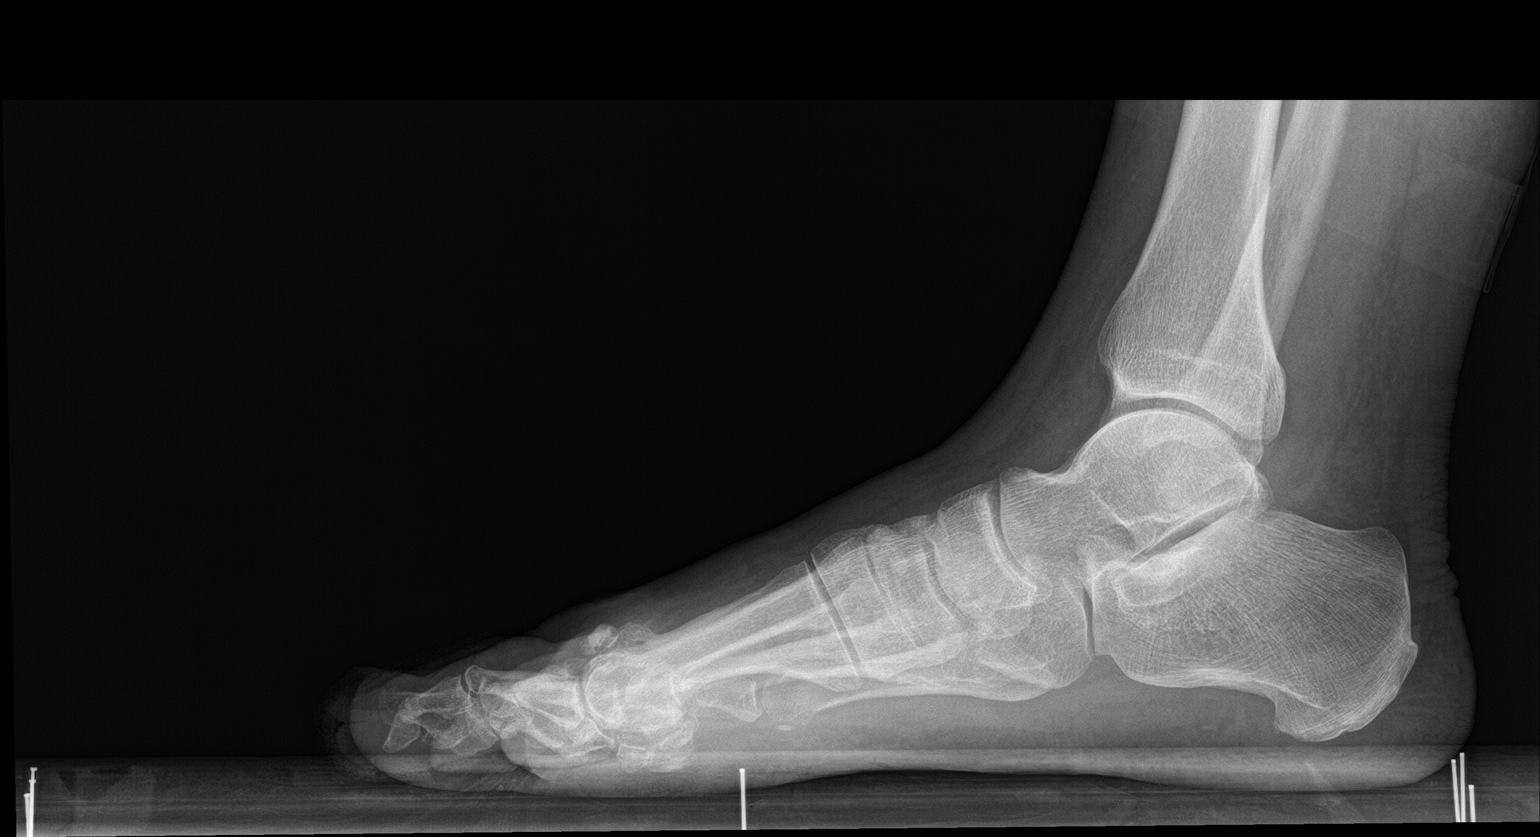

[3 of 3 positions shown; findings below may reference images not displayed]

FINDINGS: Exuberant endplate spurring about the first MTP joint with extensive
subchondral erosive changes. Subchondral cysts or geodes in the
first metatarsal head and in the base of the great toe proximal
phalanx with surrounding sclerosis. There is a mild hallux valgus
deformity. Regional soft tissue swelling. No subcutaneous gas or
radiodense foreign body. Remainder of right foot unremarkable.
IMPRESSION: Advanced erosive DJD of the first MTP joint with exuberant spurring,
and mild hallux valgus deformity.

## 2021-04-15 MED ORDER — DOXYCYCLINE HYCLATE 100 MG PO TABS: 100.0000 mg | ORAL_TABLET | Freq: Two times a day (BID) | ORAL | 0 refills | Status: AC

## 2021-04-15 NOTE — Progress Notes (Addendum)
°  Subjective:  Patient ID: Miguel [name], male    DOB: [birth year],   MRN: [id number]  Chief Complaint  Patient presents with   Wound Check    wound check.      70 y.o. male presents for follow-up of left great toe pain and right toe wound. Relates the pain is minimal on the left.. Does relate continued swelling. Has been wearing the surgical shoe as instructed.  Denies any redness or warmth. Denies any openings or drainage. Right toe doing well but does have some redness he has noticed.   Does have a history of gout.Marland Kitchen He is pre-diabetic and last A1c was 6.2. Denies any other pedal complaints. Denies n/v/f/c.   No past medical history on file.  Objective:  Physical Exam: Vascular: DP/PT pulses 2/4 bilateral. CFT <3 seconds. Normal hair growth on digits. No edema.  Skin. No lacerations or abrasions bilateral feet.Left hallux erythema improved some edema still noted. Right second digit with hyperkeratotic tissue and large fissure at distal tip with 0.1 cm x 0.1 cm x 0.1 cm wound with granular base and hyperkeratotic rim Mild erythema surrounding. No purulence noted. No probe to bone.  Nails 1-5 are thickened discolored and elongated with subungual debris.  Musculoskeletal: MMT 5/5 bilateral lower extremities in DF, PF, Inversion and Eversion. Deceased ROM in DF of ankle joint. Tender to left hallux IPJ. Minimal pain with ROM.  Neurological: Sensation intact to light touch.   Assessment:   1. Skin ulcer of second toe of right foot, limited to breakdown of skin ([facility])   2. Closed nondisplaced fracture of proximal phalanx of left great toe with routine healing, subsequent encounter        Plan:  Patient was evaluated and treated and all questions answered. -Xrays reviewed . Left hallux proximal phalanx fracture with minimal healing noted and stable alignment.  -Xrays of right foot ordered.  -Discussed treatement options for toe fracture; risks, alternatives, and benefits  explained. -Continue in  surgical shoe. -Recommend protection, rest, ice, elevation daily until symptoms improve -Rx pain med/antinflammatories as needed -Debridement as below. -Dressed with betadine steri strips. , DSD. Discused daily dressing changes.  -Doxycycline prescribed -Discussed glucose control and proper protein-rich diet.  -Discussed if any worsening redness, pain, fever or chills to call or may need to report to the emergency room. Patient expressed understanding.   Procedure: Excisional Debridement of Wound Rationale: Removal of non-viable soft tissue from the wound to promote healing.  Anesthesia: none Pre-Debridement Wound Measurements: Overlying callus  Post-Debridement Wound Measurements: 0.1 cm x 0.1 cm x 0.1 cm  Type of Debridement: Sharp Excisional Tissue Removed: Non-viable soft tissue Depth of Debridement: subcutaneous tissue. Technique: Sharp excisional debridement to bleeding, viable wound base.  Dressing: Dry, sterile, compression dressing. Disposition: Patient tolerated procedure well. Patient to return in 2 week for follow-up.  Return in about 2 weeks (around 04/29/2021) for wound check.  -Will return in two week for re-evaluation.    Lorenda Peck, DPM

## 2021-04-18 ENCOUNTER — Other Ambulatory Visit: Payer: Self-pay

## 2021-04-18 ENCOUNTER — Encounter: Payer: Medicare (Managed Care) | Attending: Family Medicine | Admitting: Dietician

## 2021-04-18 DIAGNOSIS — R7303 Prediabetes: Secondary | ICD-10-CM | POA: Diagnosis not present

## 2021-04-18 NOTE — Progress Notes (Signed)
On 04/18/2021 patient completed a post core session of the Diabetes Prevention Program course virtually with [facility]. By the end of this session patients are able to complete the following objectives:   Virtual Visit via Video Note  I connected with Miguel Rummage. [name], June 09, 1950 by a video enabled application and verified that I am speaking with the correct person using two identifiers.  Location: Patient: Virtual Provider: Office  Learning Objectives: Counter self-defeating thoughts with positive self-statements Define assertiveness.  List examples of ways to practice assertiveness.   Goals:  Record weight taken outside of class.  Track foods and beverages eaten each day in the "Food and Activity Tracker," including calories and fat grams for each item.   Track activity type, minutes you were active, and distance you reached each day in the "Food and Activity Tracker."   Follow-Up Plan: Attend next session.  Email completed "Food and Activity Trackers" before next session to be reviewed by Lifestyle Coach.

## 2021-04-22 ENCOUNTER — Ambulatory Visit: Payer: Medicare (Managed Care) | Admitting: Podiatry

## 2021-04-29 ENCOUNTER — Encounter: Payer: Self-pay | Admitting: Podiatry

## 2021-04-29 ENCOUNTER — Ambulatory Visit (INDEPENDENT_AMBULATORY_CARE_PROVIDER_SITE_OTHER): Payer: Medicare (Managed Care) | Admitting: Podiatry

## 2021-04-29 ENCOUNTER — Other Ambulatory Visit: Payer: Self-pay

## 2021-04-29 DIAGNOSIS — L97511 Non-pressure chronic ulcer of other part of right foot limited to breakdown of skin: Secondary | ICD-10-CM

## 2021-04-29 DIAGNOSIS — S92415D Nondisplaced fracture of proximal phalanx of left great toe, subsequent encounter for fracture with routine healing: Secondary | ICD-10-CM

## 2021-04-29 NOTE — Progress Notes (Signed)
°  Subjective:  Patient ID: Miguel [name], male    DOB: 03-25-1951,   MRN: [id number]  Chief Complaint  Patient presents with   Wound Check    Right foot wound check , patient states wound is the same since the last visit , no discharge some swelling , no pain      70 y.o. male presents for follow-up of left great toe pain and right toe wound. Relates the pain is minimal on the left. It is doing well. Swelling has improved. Has discontinued the surgical shoe.  Denies any redness or warmth. Denies any openings or drainage. Right toe doing well but does have some redness he has noticed.  Has been dressing with betadine. Does have a history of gout.Marland Kitchen He is pre-diabetic and last A1c was 6.2. Denies any other pedal complaints. Denies n/v/f/c.   History reviewed. No pertinent past medical history.  Objective:  Physical Exam: Vascular: DP/PT pulses 2/4 bilateral. CFT <3 seconds. Normal hair growth on digits. No edema.  Skin. No lacerations or abrasions bilateral feet.Left hallux erythema improved some edema still noted. Right second digit with hyperkeratotic tissue  at distal tip with 0.1 cm x 0.1 cm x 0.1 cm wound with granular base and hyperkeratotic rim Mild erythema surrounding. No purulence noted. No probe to bone.  Nails 1-5 are thickened discolored and elongated with subungual debris.  Musculoskeletal: MMT 5/5 bilateral lower extremities in DF, PF, Inversion and Eversion. Deceased ROM in DF of ankle joint. Tender to left hallux IPJ. Minimal pain with ROM.  Neurological: Sensation intact to light touch.   Assessment:   1. Skin ulcer of second toe of right foot, limited to breakdown of skin (HCC)   2. Closed nondisplaced fracture of proximal phalanx of left great toe with routine healing, subsequent encounter         Plan:  Patient was evaluated and treated and all questions answered. -Xrays reviewed . Left hallux proximal phalanx fracture with minimal healing noted and stable  alignment.  -Xrays of right foot ordered for next visit  -Discussed treatement options for toe fracture; risks, alternatives, and benefits explained. -Continue in  surgical shoe. -Recommend protection, rest, ice, elevation daily until symptoms improve -Rx pain med/antinflammatories as needed -Debridement as below. -Dressed with betadine , DSD. Discused daily dressing changes.  -No abx indicated today -Discussed glucose control and proper protein-rich diet.  -Discussed if any worsening redness, pain, fever or chills to call or may need to report to the emergency room. Patient expressed understanding.   Procedure: Excisional Debridement of Wound Rationale: Removal of non-viable soft tissue from the wound to promote healing.  Anesthesia: none Pre-Debridement Wound Measurements: Overlying callus  Post-Debridement Wound Measurements: 0.1 cm x 0.1 cm x 0.1 cm  Type of Debridement: Sharp Excisional Tissue Removed: Non-viable soft tissue Depth of Debridement: subcutaneous tissue. Technique: Sharp excisional debridement to bleeding, viable wound base.  Dressing: Dry, sterile, compression dressing. Disposition: Patient tolerated procedure well. Patient to return in 2 week for follow-up. Xrs on return.   Return in about 2 weeks (around 05/13/2021) for wound check.  -Will return in two week for re-evaluation.    Lorenda Peck, DPM

## 2021-05-13 ENCOUNTER — Encounter: Payer: Self-pay | Admitting: Podiatry

## 2021-05-13 ENCOUNTER — Other Ambulatory Visit: Payer: Self-pay

## 2021-05-13 ENCOUNTER — Inpatient Hospital Stay (HOSPITAL_COMMUNITY): Payer: Medicare (Managed Care)

## 2021-05-13 ENCOUNTER — Inpatient Hospital Stay (HOSPITAL_COMMUNITY)
Admission: EM | Admit: 2021-05-13 | Discharge: 2021-05-14 | DRG: 540 | Discharge status: Against Medical Advice | Payer: Medicare (Managed Care) | Attending: Internal Medicine | Admitting: Internal Medicine

## 2021-05-13 ENCOUNTER — Ambulatory Visit (INDEPENDENT_AMBULATORY_CARE_PROVIDER_SITE_OTHER): Payer: Medicare (Managed Care) | Admitting: Podiatry

## 2021-05-13 ENCOUNTER — Encounter (HOSPITAL_COMMUNITY): Payer: Self-pay

## 2021-05-13 ENCOUNTER — Emergency Department (HOSPITAL_COMMUNITY): Payer: Medicare (Managed Care)

## 2021-05-13 ENCOUNTER — Ambulatory Visit (INDEPENDENT_AMBULATORY_CARE_PROVIDER_SITE_OTHER): Payer: Medicare (Managed Care)

## 2021-05-13 DIAGNOSIS — M869 Osteomyelitis, unspecified: Principal | ICD-10-CM | POA: Diagnosis present

## 2021-05-13 DIAGNOSIS — Z5329 Procedure and treatment not carried out because of patient's decision for other reasons: Secondary | ICD-10-CM | POA: Diagnosis not present

## 2021-05-13 DIAGNOSIS — E7800 Pure hypercholesterolemia, unspecified: Secondary | ICD-10-CM | POA: Diagnosis present

## 2021-05-13 DIAGNOSIS — L97511 Non-pressure chronic ulcer of other part of right foot limited to breakdown of skin: Secondary | ICD-10-CM

## 2021-05-13 DIAGNOSIS — G629 Polyneuropathy, unspecified: Secondary | ICD-10-CM | POA: Diagnosis present

## 2021-05-13 DIAGNOSIS — L089 Local infection of the skin and subcutaneous tissue, unspecified: Secondary | ICD-10-CM | POA: Diagnosis present

## 2021-05-13 DIAGNOSIS — Z79899 Other long term (current) drug therapy: Secondary | ICD-10-CM | POA: Diagnosis not present

## 2021-05-13 DIAGNOSIS — M19071 Primary osteoarthritis, right ankle and foot: Secondary | ICD-10-CM | POA: Diagnosis present

## 2021-05-13 DIAGNOSIS — Z823 Family history of stroke: Secondary | ICD-10-CM | POA: Diagnosis not present

## 2021-05-13 DIAGNOSIS — L03115 Cellulitis of right lower limb: Secondary | ICD-10-CM | POA: Diagnosis present

## 2021-05-13 DIAGNOSIS — M86171 Other acute osteomyelitis, right ankle and foot: Secondary | ICD-10-CM | POA: Diagnosis not present

## 2021-05-13 DIAGNOSIS — L0390 Cellulitis, unspecified: Secondary | ICD-10-CM | POA: Diagnosis not present

## 2021-05-13 DIAGNOSIS — E669 Obesity, unspecified: Secondary | ICD-10-CM | POA: Diagnosis present

## 2021-05-13 DIAGNOSIS — N400 Enlarged prostate without lower urinary tract symptoms: Secondary | ICD-10-CM | POA: Diagnosis present

## 2021-05-13 DIAGNOSIS — Z8249 Family history of ischemic heart disease and other diseases of the circulatory system: Secondary | ICD-10-CM

## 2021-05-13 DIAGNOSIS — I10 Essential (primary) hypertension: Secondary | ICD-10-CM | POA: Diagnosis present

## 2021-05-13 DIAGNOSIS — E66811 Obesity, class 1: Secondary | ICD-10-CM | POA: Diagnosis present

## 2021-05-13 DIAGNOSIS — Z888 Allergy status to other drugs, medicaments and biological substances status: Secondary | ICD-10-CM

## 2021-05-13 DIAGNOSIS — M7989 Other specified soft tissue disorders: Secondary | ICD-10-CM | POA: Diagnosis not present

## 2021-05-13 DIAGNOSIS — Z20822 Contact with and (suspected) exposure to covid-19: Secondary | ICD-10-CM | POA: Diagnosis present

## 2021-05-13 DIAGNOSIS — M109 Gout, unspecified: Secondary | ICD-10-CM | POA: Diagnosis present

## 2021-05-13 DIAGNOSIS — Z5320 Procedure and treatment not carried out because of patient's decision for unspecified reasons: Secondary | ICD-10-CM | POA: Diagnosis present

## 2021-05-13 DIAGNOSIS — R7301 Impaired fasting glucose: Secondary | ICD-10-CM | POA: Diagnosis present

## 2021-05-13 DIAGNOSIS — R600 Localized edema: Secondary | ICD-10-CM | POA: Diagnosis not present

## 2021-05-13 DIAGNOSIS — Z8042 Family history of malignant neoplasm of prostate: Secondary | ICD-10-CM

## 2021-05-13 DIAGNOSIS — M86679 Other chronic osteomyelitis, unspecified ankle and foot: Secondary | ICD-10-CM | POA: Diagnosis not present

## 2021-05-13 DIAGNOSIS — Z6832 Body mass index (BMI) 32.0-32.9, adult: Secondary | ICD-10-CM

## 2021-05-13 DIAGNOSIS — M86671 Other chronic osteomyelitis, right ankle and foot: Secondary | ICD-10-CM | POA: Diagnosis not present

## 2021-05-13 DIAGNOSIS — Z7984 Long term (current) use of oral hypoglycemic drugs: Secondary | ICD-10-CM | POA: Diagnosis not present

## 2021-05-13 HISTORY — DX: Obesity, unspecified: E66.9

## 2021-05-13 HISTORY — DX: Obesity, class 1: E66.811

## 2021-05-13 LAB — COMPREHENSIVE METABOLIC PANEL
ALT: 20 U/L (ref 0–44)
AST: 16 U/L (ref 15–41)
Albumin: 3.9 g/dL (ref 3.5–5.0)
Alkaline Phosphatase: 39 U/L (ref 38–126)
Anion gap: 7 (ref 5–15)
BUN: 20 mg/dL (ref 8–23)
CO2: 27 mmol/L (ref 22–32)
Calcium: 9.3 mg/dL (ref 8.9–10.3)
Chloride: 105 mmol/L (ref 98–111)
Creatinine, Ser: 0.8 mg/dL (ref 0.61–1.24)
GFR, Estimated: >60 mL/min (ref >60)
Glucose, Bld: 116 mg/dL — ABNORMAL HIGH (ref 70–99)
Potassium: 4.2 mmol/L (ref 3.5–5.1)
Sodium: 139 mmol/L (ref 135–145)
Total Bilirubin: 0.7 mg/dL (ref 0.3–1.2)
Total Protein: 7.3 g/dL (ref 6.5–8.1)

## 2021-05-13 LAB — CBC WITH DIFFERENTIAL/PLATELET
Abs Immature Granulocytes: 0.02 10*3/uL (ref 0.00–0.07)
Basophils Absolute: 0 10*3/uL (ref 0.0–0.1)
Basophils Relative: 0 %
Eosinophils Absolute: 0.1 10*3/uL (ref 0.0–0.5)
Eosinophils Relative: 2 %
HCT: 40.6 % (ref 39.0–52.0)
Hemoglobin: 13 g/dL (ref 13.0–17.0)
Immature Granulocytes: 0 %
Lymphocytes Relative: 18 %
Lymphs Abs: 1.2 10*3/uL (ref 0.7–4.0)
MCH: 28.3 pg (ref 26.0–34.0)
MCHC: 32 g/dL (ref 30.0–36.0)
MCV: 88.5 fL (ref 80.0–100.0)
Monocytes Absolute: 0.5 10*3/uL (ref 0.1–1.0)
Monocytes Relative: 7 %
Neutro Abs: 4.9 10*3/uL (ref 1.7–7.7)
Neutrophils Relative %: 73 %
Platelets: 264 10*3/uL (ref 150–400)
RBC: 4.59 MIL/uL (ref 4.22–5.81)
RDW: 13.8 % (ref 11.5–15.5)
WBC: 6.7 10*3/uL (ref 4.0–10.5)
nRBC: 0 % (ref 0.0–0.2)

## 2021-05-13 LAB — CBG MONITORING, ED
Glucose-Capillary: 68 mg/dL — ABNORMAL LOW (ref 70–99)
Glucose-Capillary: 117 mg/dL — ABNORMAL HIGH (ref 70–99)

## 2021-05-13 LAB — RESP PANEL BY RT-PCR (FLU A&B, COVID) ARPGX2
Influenza A by PCR: NEGATIVE
Influenza B by PCR: NEGATIVE
SARS Coronavirus 2 by RT PCR: NEGATIVE

## 2021-05-13 LAB — LACTIC ACID, PLASMA: Lactic Acid, Venous: 1.4 mmol/L (ref 0.5–1.9)

## 2021-05-13 LAB — PROTIME-INR
INR: 1.1 (ref 0.8–1.2)
Prothrombin Time: 13.7 seconds (ref 11.4–15.2)

## 2021-05-13 LAB — APTT: aPTT: 33 seconds (ref 24–36)

## 2021-05-13 IMAGING — MR MR FOOT*R* WO/W CM
10 series · 40 of 40 positions shown · IV contrast (GADAVIST)
Comparison: Right foot radiographs [DATE], [DATE] .

CLINICAL DATA: Bone mass or bone pain. Aggressive features on
x-ray. Osteomyelitis.

EXAM:
MRI OF THE RIGHT FOREFOOT WITHOUT AND WITH CONTRAST
TECHNIQUE: Multiplanar, multisequence MR imaging of the right forefoot. Was
performed before and after the administration of intravenous
contrast.
CONTRAST:  10mL GADAVIST GADOBUTROL 1 MMOL/ML IV SOLN

[Series 3: T1 · coronal · 3.0mm · 0.51mm/px · 5 of 43 slices shown (1 of 2)]
[im 1/43]
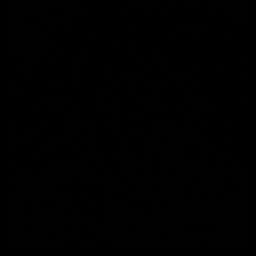
[im 11/43]
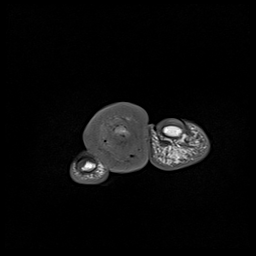
[im 22/43]
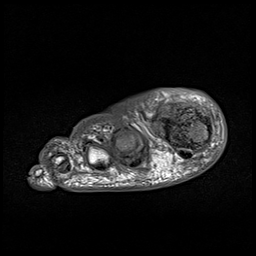
[im 32/43]
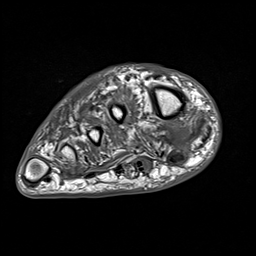
[im 43/43]
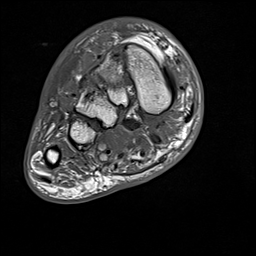

[Series 4: T2 fat-sat · coronal · 3.0mm · 0.41mm/px · 5 of 42 slices shown (1 of 3)]
[im 1/42]
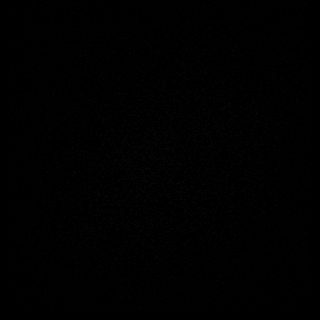
[im 11/42]
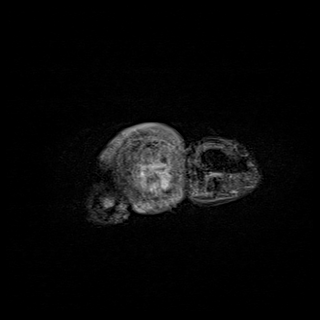
[im 21/42]
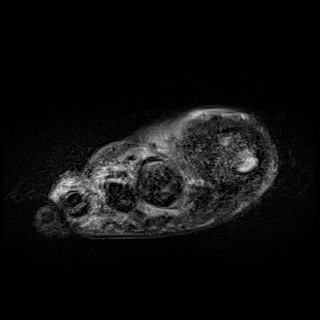
[im 31/42]
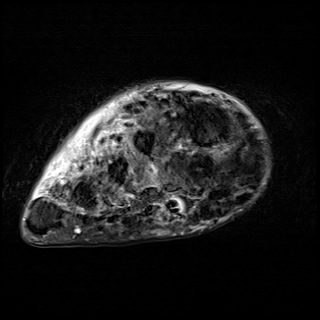
[im 42/42]
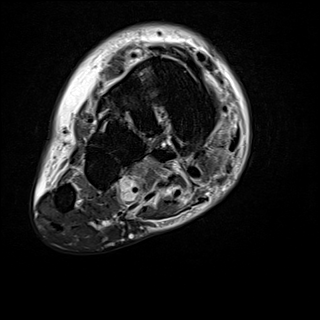

[Series 5: T2 fat-sat · axial · 3.0mm · 0.70mm/px · z∈[-84,-2]mm · 3 of 25 slices shown (2 of 3)]
[im 1/25]
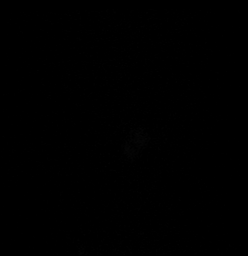
[im 13/25]
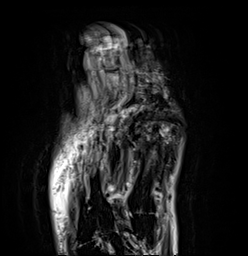
[im 25/25]
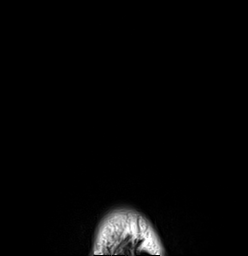

[Series 6: T1 · axial · 3.0mm · 0.70mm/px · z∈[-86,-1]mm · 3 of 26 slices shown (2 of 2)]
[im 1/26]
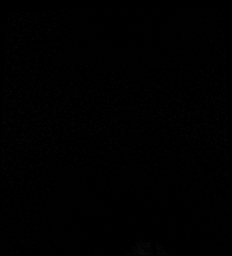
[im 13/26]
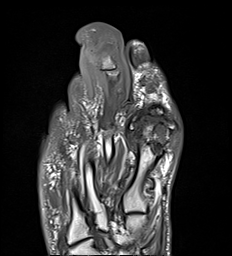
[im 26/26]
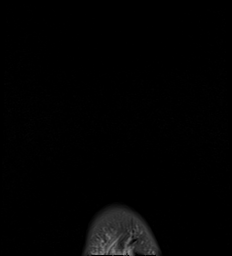

[Series 7: T2 fat-sat · axial · 3.0mm · 0.70mm/px · z∈[-87,-2]mm · 3 of 26 slices shown (3 of 3)]
[im 1/26]
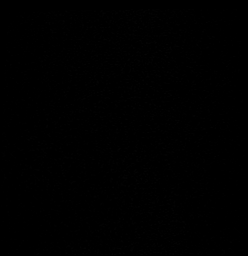
[im 13/26]
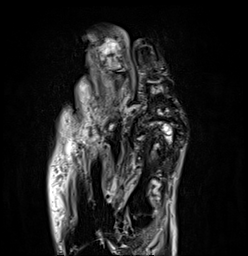
[im 26/26]
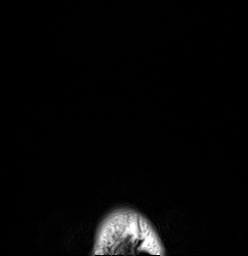

[Series 8: STIR · sagittal · 3.0mm · 0.35mm/px · 4 of 36 slices shown]
[im 1/36]
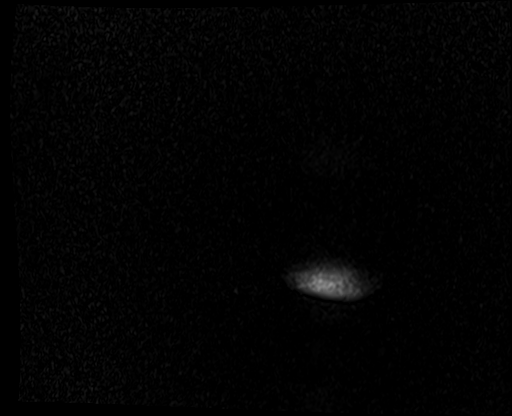
[im 12/36]
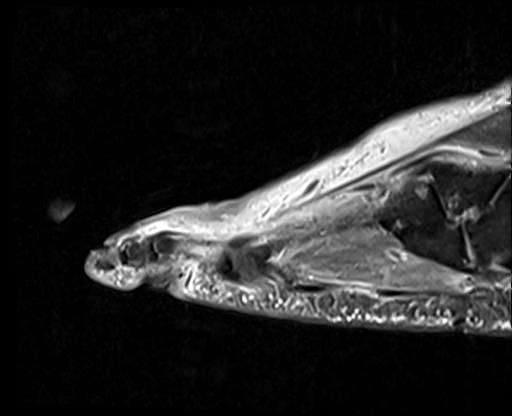
[im 24/36]
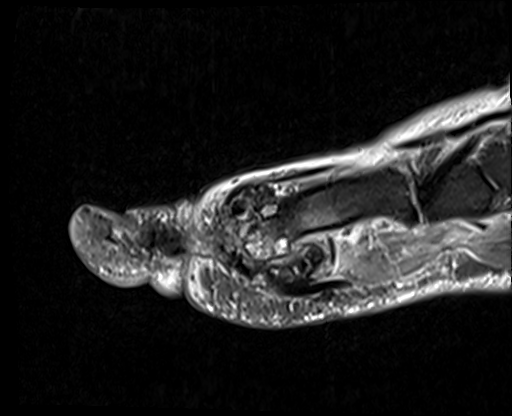
[im 36/36]
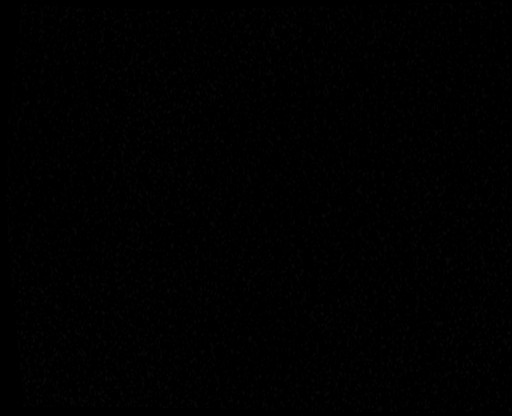

[Series 9: T1 fat-sat · coronal · non-contrast · 3.0mm · 0.51mm/px · 5 of 43 slices shown]
[im 1/43]
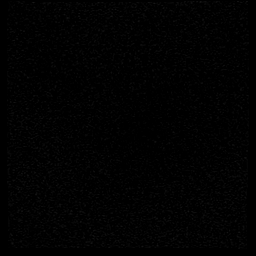
[im 11/43]
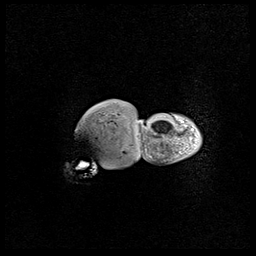
[im 22/43]
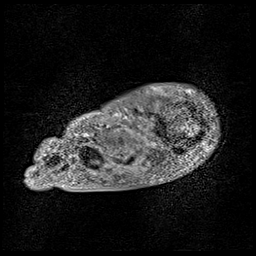
[im 32/43]
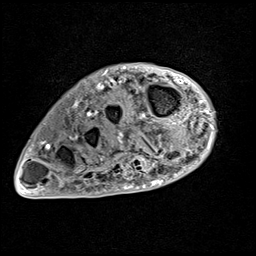
[im 43/43]
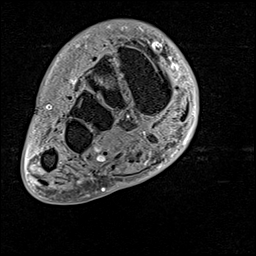

[Series 10: T1 fat-sat post-contrast · coronal · 3.0mm · 0.51mm/px · 5 of 43 slices shown (1 of 3)]
[im 1/43]
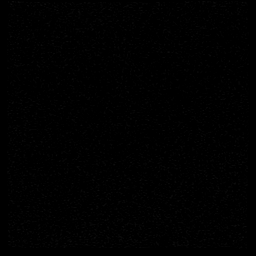
[im 11/43]
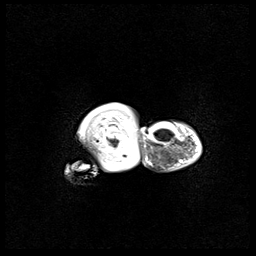
[im 22/43]
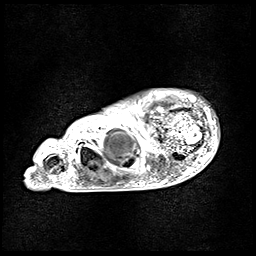
[im 32/43]
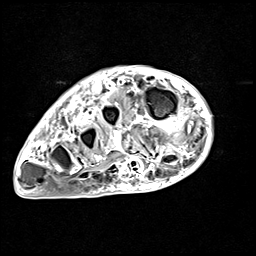
[im 43/43]
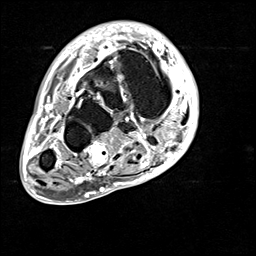

[Series 11: T1 fat-sat post-contrast · sagittal · 3.0mm · 0.35mm/px · 4 of 36 slices shown (2 of 3)]
[im 1/36]
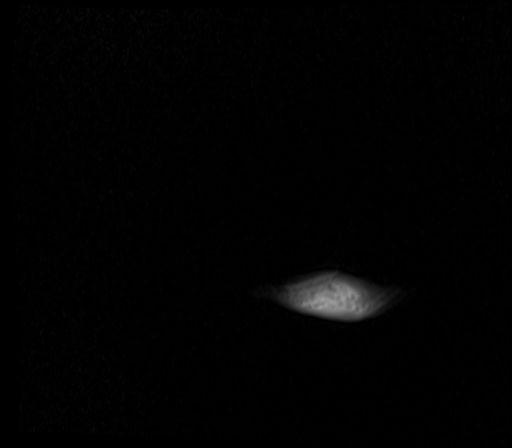
[im 12/36]
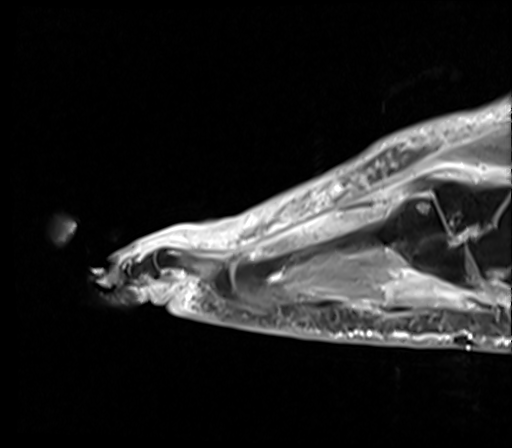
[im 24/36]
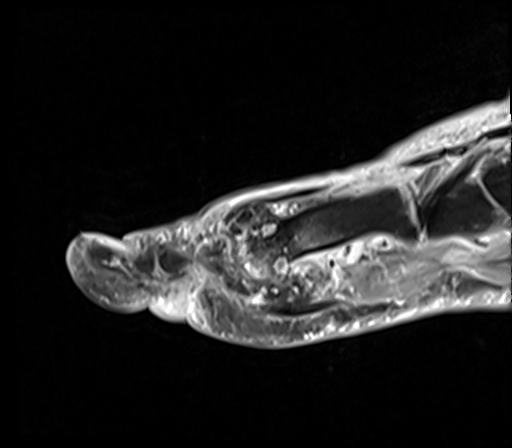
[im 36/36]
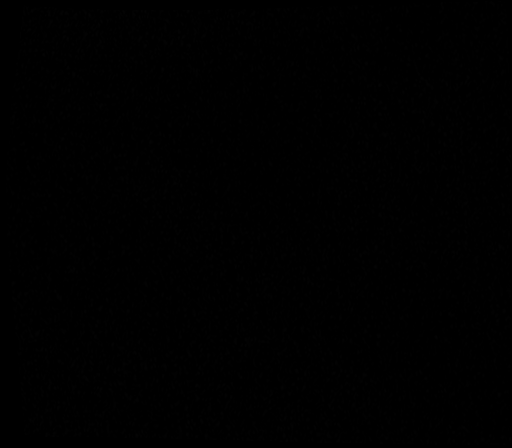

[Series 12: T1 fat-sat post-contrast · axial · 3.0mm · 0.56mm/px · z∈[-91,-7]mm · 3 of 26 slices shown (3 of 3)]
[im 1/26]
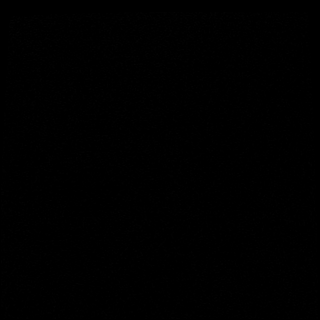
[im 13/26]
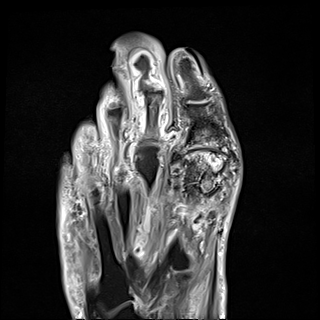
[im 26/26]
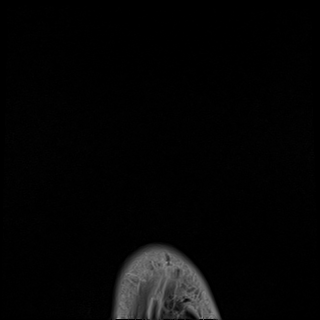

[40 of 40 positions shown; findings below may reference images not displayed]

FINDINGS: Despite efforts by the technologist and patient, motion artifact is
present on today's exam and could not be eliminated. This reduces
exam sensitivity and specificity.

Bones/Joint/Cartilage

There are again severe degenerative changes of the great toe
metatarsophalangeal joint including bone-on-bone contact, extensive
subchondral cystic change and marrow edema, and high-grade
peripheral degenerative osteophytes.

a on today's radiographs there is new erosion of the distal phalanx
and middle phalanx of the second toe. On MRI there is high-grade
near complete erosion of the distal phalanx. There is diffuse marrow
edema throughout the middle phalanx with high-grade dorsal and
plantar cortical erosion diffusely (sagittal a series 8, image 19).
There is also mild marrow edema within the proximal phalanx
diffusely, with possible minimal cortical erosion of the distal
dorsal aspect (sagittal image 19). Findings indicate acute
osteomyelitis of the distal and middle phalanges and possible
osteomyelitis of the proximal phalanx.

Ligaments

The Lisfranc ligament complex appears intact.

Muscles and Tendons

Mild edema within the plantar foot musculature.

Soft tissues

High-grade soft tissue swelling and thickening of the second toe
surrounding the osteomyelitis.

Moderate dorsal midfoot soft tissue edema and swelling.
IMPRESSION: 1. Osteomyelitis of the distal greater than middle phalanx of second
toe. Diffuse marrow edema and possible minimal cortical erosion of
the distal aspect of the proximal phalanx of the second toe possibly
representing additional osteomyelitis.
2. Severe osteoarthritis of the great toe metatarsophalangeal joint.

## 2021-05-13 IMAGING — DX DG FOOT COMPLETE 3+V*R*
3 series · 3 of 3 positions shown · non-contrast
Comparison: [DATE]

CLINICAL DATA: Right foot wound to second digit. Skin ulceration
with breakdown.

EXAM:
RIGHT FOOT COMPLETE - 3+ VIEW

[foot ap wb]
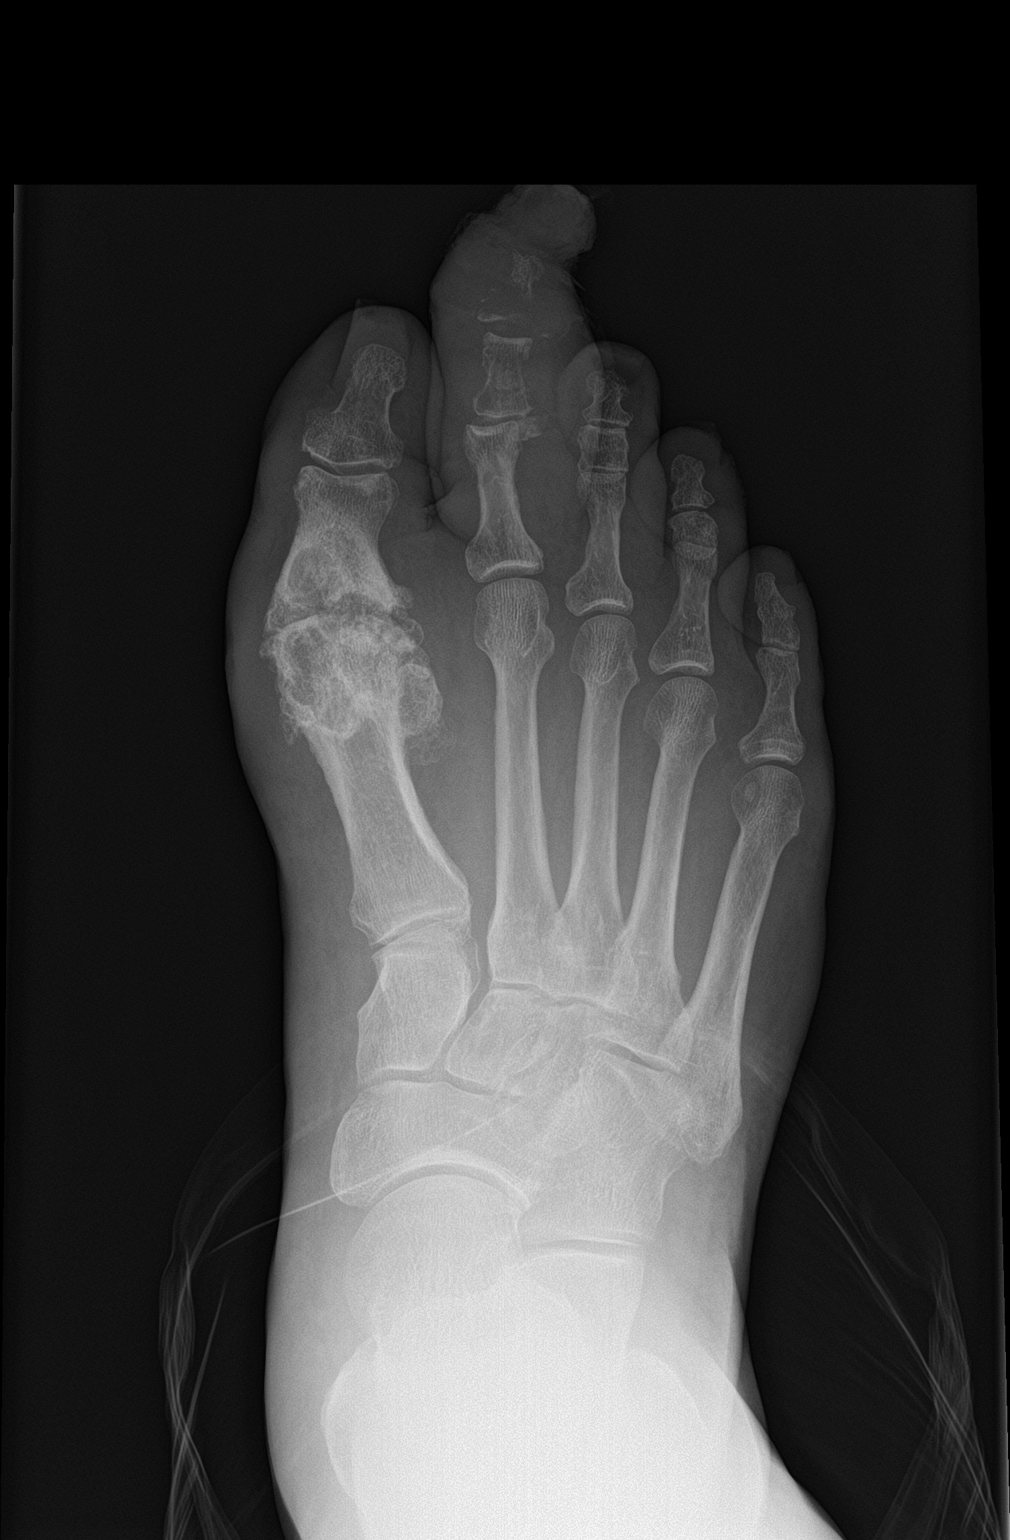

[foot obl wb]
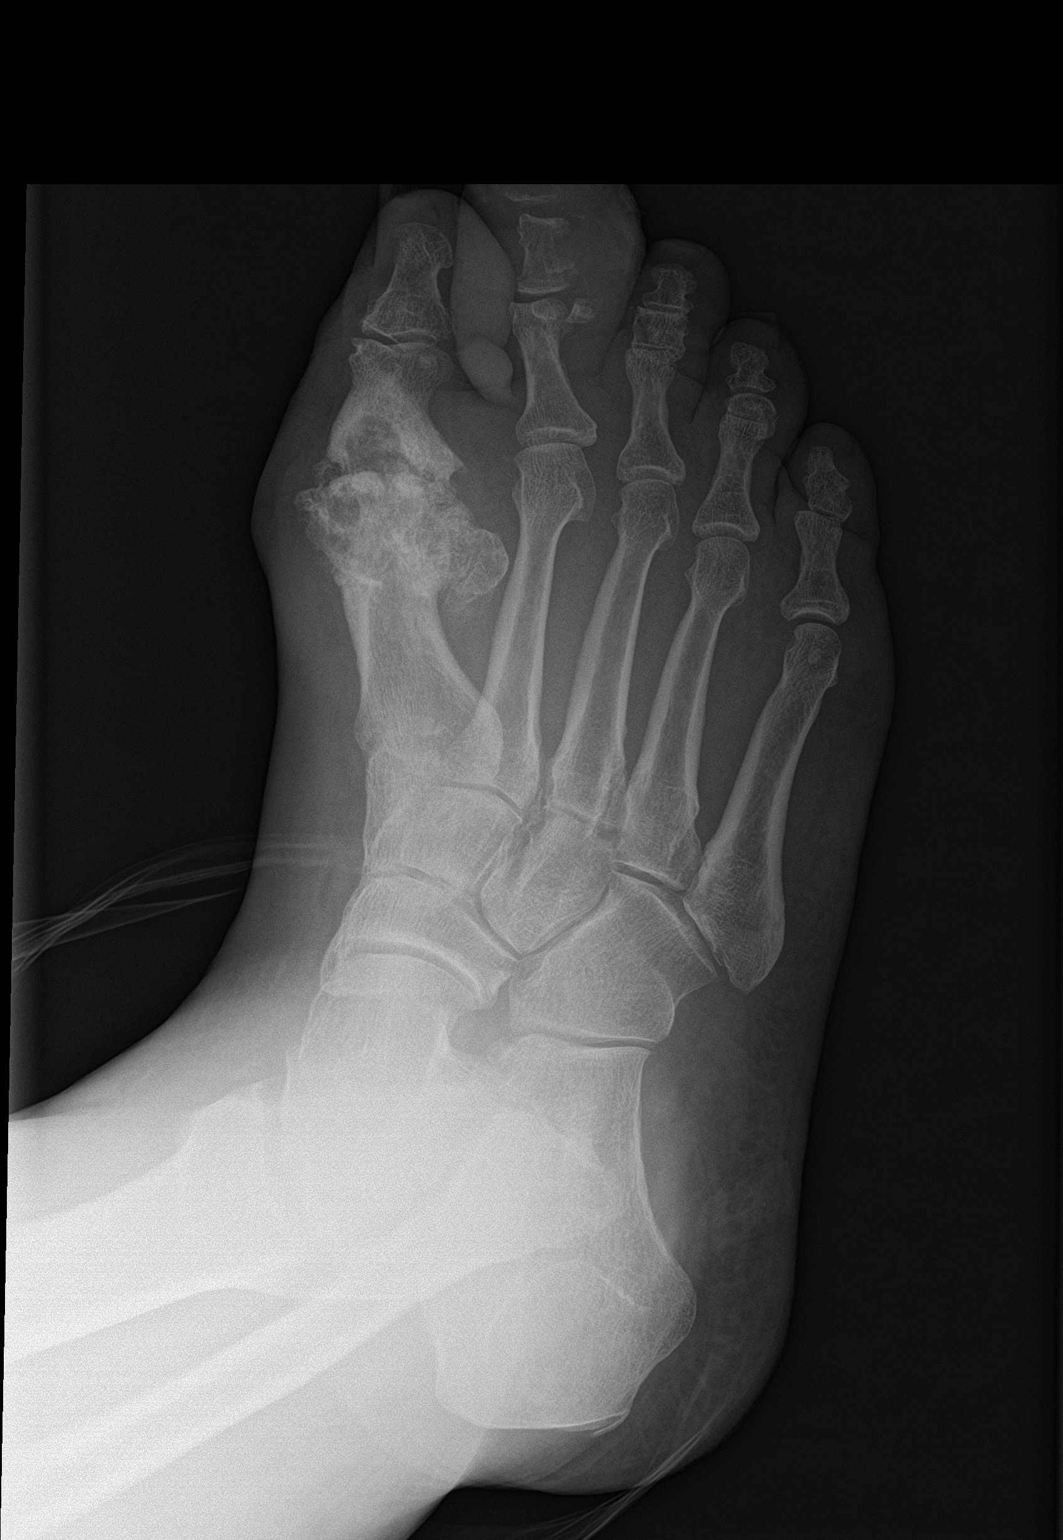

[foot lat wb]
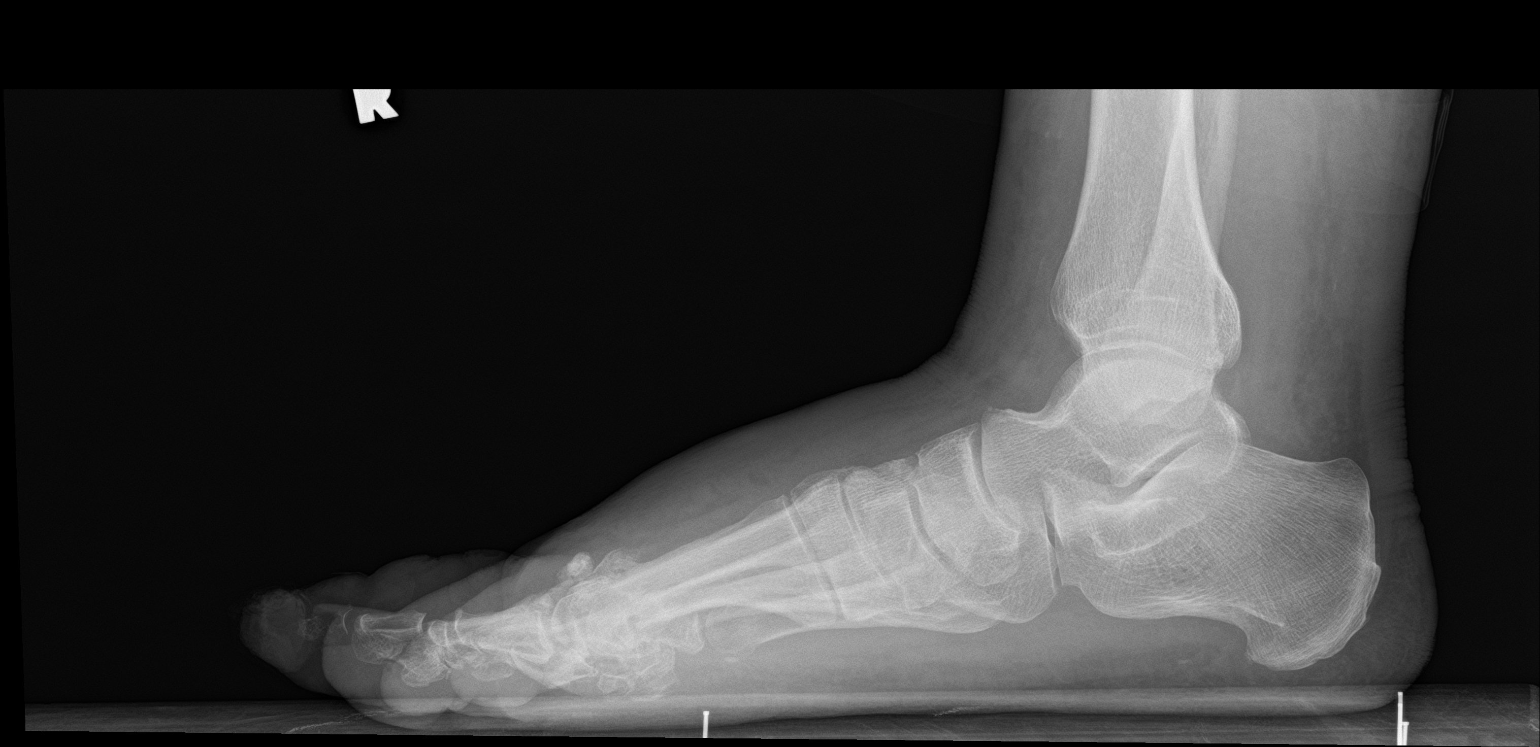

[3 of 3 positions shown; findings below may reference images not displayed]

FINDINGS: There is no high-grade cortical erosion of the distal phalanx of the
second toe. There is decreased density and more mild-to-moderate
erosion of the middle phalanx of second toe. High-grade soft tissue
swelling.

Great toe metatarsophalangeal joint bone-on-bone contact with
extensive subchondral sclerosis, subchondral cystic change, and
peripheral osteophytosis. Mild hallux valgus, unchanged.

Mild dorsal talonavicular degenerative osteophytosis.
IMPRESSION: :
IMPRESSION: 1. Cortical erosion concerning for osteomyelitis of the distal
greater than middle phalanges of the second toe. High-grade soft
tissue swelling.
2. Severe great toe metatarsophalangeal joint osteoarthritis,
unchanged from prior.

## 2021-05-13 MED ORDER — DUTASTERIDE 0.5 MG PO CAPS
0.5000 mg | ORAL_CAPSULE | Freq: Every day | ORAL | Status: DC
  Administered 2021-05-14: 0.5 mg via ORAL
  Filled 2021-05-13: qty 1

## 2021-05-13 MED ORDER — ACETAMINOPHEN 325 MG PO TABS: 650.0000 mg | ORAL_TABLET | Freq: Four times a day (QID) | ORAL | Status: DC | PRN

## 2021-05-13 MED ORDER — ROSUVASTATIN CALCIUM 20 MG PO TABS: 20.0000 mg | ORAL_TABLET | Freq: Every evening | ORAL | Status: DC

## 2021-05-13 MED ORDER — BISOPROLOL FUMARATE 5 MG PO TABS
5.0000 mg | ORAL_TABLET | Freq: Every day | ORAL | Status: DC
  Administered 2021-05-14: 5 mg via ORAL
  Filled 2021-05-13: qty 1

## 2021-05-13 MED ORDER — VANCOMYCIN HCL IN DEXTROSE 1-5 GM/200ML-% IV SOLN
1000.0000 mg | Freq: Two times a day (BID) | INTRAVENOUS | Status: DC
  Administered 2021-05-14: 1000 mg via INTRAVENOUS
  Filled 2021-05-13: qty 200

## 2021-05-13 MED ORDER — METRONIDAZOLE 500 MG/100ML IV SOLN
500.0000 mg | Freq: Two times a day (BID) | INTRAVENOUS | Status: DC
  Administered 2021-05-13 – 2021-05-14 (×2): 500 mg via INTRAVENOUS
  Filled 2021-05-13 (×2): qty 100

## 2021-05-13 MED ORDER — ONDANSETRON HCL 4 MG/2ML IJ SOLN: 4.0000 mg | Freq: Four times a day (QID) | INTRAMUSCULAR | Status: DC | PRN

## 2021-05-13 MED ORDER — COLCHICINE 0.6 MG PO TABS: 0.6000 mg | ORAL_TABLET | Freq: Every day | ORAL | Status: DC

## 2021-05-13 MED ORDER — HYDROMORPHONE HCL 1 MG/ML IJ SOLN: 1.0000 mg | INTRAMUSCULAR | Status: DC | PRN

## 2021-05-13 MED ORDER — GADOBUTROL 1 MMOL/ML IV SOLN
10.0000 mL | Freq: Once | INTRAVENOUS | Status: AC | PRN
  Administered 2021-05-13: 10 mL via INTRAVENOUS

## 2021-05-13 MED ORDER — ACETAMINOPHEN 650 MG RE SUPP: 650.0000 mg | Freq: Four times a day (QID) | RECTAL | Status: DC | PRN

## 2021-05-13 MED ORDER — METFORMIN HCL ER 500 MG PO TB24
500.0000 mg | ORAL_TABLET | Freq: Every day | ORAL | Status: DC
  Administered 2021-05-14: 500 mg via ORAL
  Filled 2021-05-13: qty 1

## 2021-05-13 MED ORDER — GABAPENTIN 300 MG PO CAPS
600.0000 mg | ORAL_CAPSULE | Freq: Every day | ORAL | Status: DC
  Administered 2021-05-13: 600 mg via ORAL
  Filled 2021-05-13: qty 2

## 2021-05-13 MED ORDER — LOSARTAN POTASSIUM 25 MG PO TABS
100.0000 mg | ORAL_TABLET | Freq: Every day | ORAL | Status: DC
  Administered 2021-05-13 – 2021-05-14 (×2): 100 mg via ORAL
  Filled 2021-05-13 (×2): qty 4

## 2021-05-13 MED ORDER — LACTATED RINGERS IV SOLN: INTRAVENOUS | Status: AC

## 2021-05-13 MED ORDER — ONDANSETRON HCL 4 MG PO TABS: 4.0000 mg | ORAL_TABLET | Freq: Four times a day (QID) | ORAL | Status: DC | PRN

## 2021-05-13 MED ORDER — HYDROCHLOROTHIAZIDE 12.5 MG PO TABS
12.5000 mg | ORAL_TABLET | Freq: Every day | ORAL | Status: DC
  Administered 2021-05-13 – 2021-05-14 (×2): 12.5 mg via ORAL
  Filled 2021-05-13 (×2): qty 1

## 2021-05-13 MED ORDER — VANCOMYCIN HCL 2000 MG/400ML IV SOLN
2000.0000 mg | Freq: Once | INTRAVENOUS | Status: AC
  Administered 2021-05-13: 2000 mg via INTRAVENOUS
  Filled 2021-05-13: qty 400

## 2021-05-13 MED ORDER — SODIUM CHLORIDE 0.9 % IV SOLN
2.0000 g | INTRAVENOUS | Status: DC
  Administered 2021-05-13: 2 g via INTRAVENOUS
  Filled 2021-05-13: qty 20

## 2021-05-13 MED ORDER — LOSARTAN POTASSIUM-HCTZ 100-12.5 MG PO TABS: 1.0000 | ORAL_TABLET | Freq: Every day | ORAL | Status: DC

## 2021-05-13 NOTE — ED Notes (Signed)
CBG- 68

## 2021-05-13 NOTE — H&P (Signed)
History and Physical    SVEN PINHEIRO OIN:[id number] DOB: 23-Sep-1950 DOA: 05/13/2021  PCP: Cari Caraway, MD  Patient coming from: Home.  Referred by the podiatry office.  I have personally briefly reviewed patient's old medical records in [place]  Chief Complaint: Home.  HPI: Miguel [name] is a 71 y.o. male with medical history significant of hypertension, BPH, gout, hyperlipidemia, impaired fasting glucose, neuropathy, class I obesity who was having a 2-week follow-up today with podiatry for a left toe wound which has significantly worsened.  Denies fever, chills, night sweats, decreased appetite, fatigue, malaise, sore throat, wheezing, dyspnea or hemoptysis.  No chest pain, palpitations, diaphoresis, PND, orthopnea or pitting edema of the lower extremities.  Denied abdominal pain, nausea, emesis, diarrhea, constipation, melena or hematochezia.  No flank pain, dysuria, frequency or hematuria.  No polyuria, polydipsia, polyphagia or blurred vision.  ED Course: Initial vital signs were temperature 97.8 F, pulse 64, respiration 18, BP 134/84 mmHg O2 sat 98% on room air.  The patient received vancomycin and ceftriaxone in the emergency department.  Lab work: CBC showed a white count 6.7, hemoglobin 13.0 g/dL platelets 264.  Normal PT, INR and PTT.  Lactic acid unremarkable.  CMP showed a glucose of 160 mg/dL, but was otherwise unremarkable.  Imaging: Right foot x-ray showed cortical erosion concerning for osteomyelitis of the distal and middle phalanges of the second toe.  Osteomyelitis was confirmed by MRI.  Please see images and full radiology report for further details.  Review of Systems: As per HPI otherwise all other systems reviewed and are negative.  Past Medical History:  Diagnosis Date   Class 1 obesity 05/13/2021   Past Surgical History:  Procedure Laterality Date   arm surgery     Bicep reattached   TRANSURETHRAL RESECTION OF PROSTATE     Social History   reports that he has never smoked. He has never used smokeless tobacco. He reports current alcohol use. He reports that he does not use drugs.  Allergies  Allergen Reactions   Fenofibrate Other (See Comments)   Gemfibrozil Other (See Comments)   Simvastatin Other (See Comments)   Family History  Problem Relation Age of Onset   Heart failure Mother    Prostate cancer Father    Stroke Brother    CAD Brother    CAD Brother    Prior to Admission medications   Medication Sig Start Date End Date Taking? Authorizing Provider  B Complex Vitamins (VITAMIN B COMPLEX) TABS 1 tablet    [provider]  bisoprolol (ZEBETA) 5 MG tablet Take 5 mg by mouth daily. 02/23/21   [provider]  Cholecalciferol (VITAMIN D-3) 125 MCG (5000 UT) TABS 1 tablet    [provider]  colchicine 0.6 MG tablet TAKE 1 TABLET(0.6 MG) BY MOUTH DAILY FOR 21 DAYS 03/11/21   Lorenda Peck, MD  dutasteride (AVODART) 0.5 MG capsule Take 1 tablet by mouth daily. 02/01/17   [provider]  gabapentin (NEURONTIN) 600 MG tablet 1 tablet 02/01/17   [provider]  losartan-hydrochlorothiazide (HYZAAR) 100-12.5 MG tablet Take 1 tablet by mouth daily. 03/26/17   [provider]  metFORMIN (GLUCOPHAGE-XR) 500 MG 24 hr tablet TAKE 1 TABLET BY MOUTH EVERY DAY WITH THE EVENING MEAL    [provider]  naproxen (NAPROSYN) 500 MG tablet SMARTSIG:1 Tablet(s) By Mouth Every 12 Hours PRN 02/15/21   [provider]  rosuvastatin (CRESTOR) 20 MG tablet Take 1 tablet by mouth daily.  02/23/17   [provider]   Physical Exam: Vitals:   05/13/21 1148 05/13/21 1154 05/13/21 1507  BP: 134/84  126/75  Pulse: 64  (!) 55  Resp: 18  17  Temp: 97.8 F (36.6 C)    TempSrc: Oral    SpO2: 98%  97%  Weight:  102.1 kg   Height:  5' 10.25" (1.784 m)    Constitutional: NAD, calm, comfortable Eyes: PERRL, lids and conjunctivae normal ENMT: Mucous membranes are  moist. Posterior pharynx clear of any exudate or lesions. Neck: normal, supple, no masses, no thyromegaly Respiratory: clear to auscultation bilaterally, no wheezing, no crackles. Normal respiratory effort. No accessory muscle use.  Cardiovascular: Regular rate and rhythm, no murmurs / rubs / gallops. No extremity edema. 2+ pedal pulses. No carotid bruits.  Abdomen: Obese, no distention.  Soft, no tenderness, no masses palpated. No hepatosplenomegaly. Bowel sounds positive.  Musculoskeletal: no clubbing / cyanosis. Good ROM, no contractures. Normal muscle tone.  Skin: Extensive edema with surrounding erythema, calor and plaques without significant tenderness.  Please see images below. Neurologic: CN 2-12 grossly intact. Sensation intact, DTR normal. Strength 5/5 in all 4.  Psychiatric: Normal judgment and insight. Alert and oriented x 3. Normal mood.        Labs on Admission: I have personally reviewed following labs and imaging studies  CBC: Recent Labs  Lab 05/13/21 1222  WBC 6.7  NEUTROABS 4.9  HGB 13.0  HCT 40.6  MCV 88.5  PLT 416    Basic Metabolic Panel: Recent Labs  Lab 05/13/21 1222  NA 139  K 4.2  CL 105  CO2 27  GLUCOSE 116*  BUN 20  CREATININE 0.80  CALCIUM 9.3    GFR: Estimated Creatinine Clearance: 101.8 mL/min (by C-G formula based on SCr of 0.8 mg/dL).  Liver Function Tests: Recent Labs  Lab 05/13/21 1222  AST 16  ALT 20  ALKPHOS 39  BILITOT 0.7  PROT 7.3  ALBUMIN 3.9    Urine analysis: No results found for: COLORURINE, APPEARANCEUR, LABSPEC, PHURINE, GLUCOSEU, HGBUR, BILIRUBINUR, KETONESUR, PROTEINUR, UROBILINOGEN, NITRITE, LEUKOCYTESUR  Radiological Exams on Admission: MR FOOT RIGHT W WO CONTRAST  Result Date: 05/13/2021 CLINICAL DATA:  Bone mass or bone pain. Aggressive features on x-ray. Osteomyelitis. EXAM: MRI OF THE RIGHT FOREFOOT WITHOUT AND WITH CONTRAST TECHNIQUE: Multiplanar, multisequence MR imaging of the right forefoot. Was  performed before and after the administration of intravenous contrast. CONTRAST:  69mL GADAVIST GADOBUTROL 1 MMOL/ML IV SOLN COMPARISON:  Right foot radiographs 05/13/2021, 04/15/2021 . FINDINGS: Despite efforts by the technologist and patient, motion artifact is present on today's exam and could not be eliminated. This reduces exam sensitivity and specificity. Bones/Joint/Cartilage There are again severe degenerative changes of the great toe metatarsophalangeal joint including bone-on-bone contact, extensive subchondral cystic change and marrow edema, and high-grade peripheral degenerative osteophytes. a on today's radiographs there is new erosion of the distal phalanx and middle phalanx of the second toe. On MRI there is high-grade near complete erosion of the distal phalanx. There is diffuse marrow edema throughout the middle phalanx with high-grade dorsal and plantar cortical erosion diffusely (sagittal a series 8, image 19). There is also mild marrow edema within the proximal phalanx diffusely, with possible minimal cortical erosion of the distal dorsal aspect (sagittal image 19). Findings indicate acute osteomyelitis of the distal and middle phalanges and possible osteomyelitis of the proximal phalanx. Ligaments The Lisfranc ligament complex appears intact. Muscles and Tendons Mild edema within the plantar foot  musculature. Soft tissues High-grade soft tissue swelling and thickening of the second toe surrounding the osteomyelitis. Moderate dorsal midfoot soft tissue edema and swelling. IMPRESSION: 1. Osteomyelitis of the distal greater than middle phalanx of second toe. Diffuse marrow edema and possible minimal cortical erosion of the distal aspect of the proximal phalanx of the second toe possibly representing additional osteomyelitis. 2. 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 Electronically Signed   By: Yvonne Kendall M.D.   On: 05/13/2021 13:59   DG Foot Complete Right  Result  Date: 05/13/2021 CLINICAL DATA:  Right foot wound to second digit. Skin ulceration with breakdown. EXAM: RIGHT FOOT COMPLETE - 3+ VIEW COMPARISON:  04/15/2021 FINDINGS: There is no high-grade cortical erosion of the distal phalanx of the second toe. There is decreased density and more mild-to-moderate erosion of the middle phalanx of second toe. High-grade soft tissue swelling. Great toe metatarsophalangeal joint bone-on-bone contact with extensive subchondral sclerosis, subchondral cystic change, and peripheral osteophytosis. Mild hallux valgus, unchanged. Mild dorsal talonavicular degenerative osteophytosis. IMPRESSION:: IMPRESSION: 1. Cortical erosion concerning for osteomyelitis of the distal greater than middle phalanges of the second toe. High-grade soft tissue swelling. 2. Severe great toe metatarsophalangeal joint osteoarthritis, unchanged from prior. Electronically Signed   By: Yvonne Kendall M.D.   On: 05/13/2021 14:00    EKG: Independently reviewed.  Vent. rate 66 BPM PR interval 190 ms QRS duration 84 ms QT/QTcB 410/430 ms P-R-T axes 68 31 50 Sinus rhythm  Assessment/Plan Principal Problem:   Osteomyelitis of second toe of right foot (HCC) Admit to MedSurg/inpatient. Keep n.p.o. after midnight. Continue ceftriaxone 2 g daily. Continue vancomycin per pharmacy. Analgesics as needed. Awaiting for podiatry evaluation.  Active Problems:   Benign hypertension Continue losartan/HCTZ. Monitor BP, renal function electrolytes.    Benign prostatic hyperplasia Continue Avodart.    Gout On colchicine.    Hypercholesterolemia Continue rosuvastatin 20 mg p.o. daily.    Impaired fasting glucose Continue metformin. Monitor CBG every 6 hours while NPO.    Neuropathy Continue gabapentin.    Class 1 obesity Lifestyle modifications. Follow-up with PCP.    DVT prophylaxis: SCDs. Code Status:   Full code. Family Communication:   Disposition Plan:   Patient is  from:  Home.  Anticipated DC to:  Home.  Anticipated DC date:  05/15/2021.  Anticipated DC barriers: Clinical status.  Consults called:  Awaiting podiatry on-call response. Admission status:  Inpatient/MedSurg.  Severity of Illness: High severity in the setting of osteomyelitis of the second right toe which will need amputation.  Reubin Milan MD [facility]  How to contact the [employer] Attending or Consulting provider [place] or covering provider during after hours [place], for this patient?   Check the care team in [facility] and look for a) attending/consulting [facility] provider listed and b) the [facility] team listed Log into [email or web address] and use [place]'s universal password to access. If you do not have the password, please contact the hospital operator. Locate the [facility] provider you are looking for under [facility] and page to a number that you can be directly reached. If you still have difficulty reaching the provider, please page the [facility] (Director on Call) for the Hospitalists listed on amion for assistance.  05/13/2021, 3:50 PM   This document was prepared with Par[employer] and may contain some unintended transcription

## 2021-05-13 NOTE — Progress Notes (Signed)
Inpatient Diabetes Program Recommendations  AACE/ADA: New Consensus Statement on Inpatient Glycemic Control (2015)  Target Ranges:  Prepandial:   less than 140 mg/dL      Peak postprandial:   less than 180 mg/dL (1-2 hours)      Critically ill patients:  140 - 180 mg/dL   No results found for: GLUCAP, HGBA1C  Review of Glycemic Control  Diabetes history: Prediabetes Outpatient Diabetes medications: None Current orders for Inpatient glycemic control: None  HgbA1C ordered and pending CBG - 116  Inpatient Diabetes Program Recommendations:    Will follow gluocse trends while inpatient.  HgbA1C pending.  Thank you. Lorenda Peck, RD, LDN, CDE Inpatient Diabetes Coordinator [phone]

## 2021-05-13 NOTE — ED Triage Notes (Signed)
Patient reports that he has had a right 2nd toe infection x 1 1/2 months and today he went to the podiatrist and was told to come to the ED for a MRI due to worsening of the infection x 2 weeks.

## 2021-05-13 NOTE — Care Management Note (Signed)
Podiatry Consult received will see patient in the AM.  Dr. Landis Martins On Call provider  [facility] [phone] office [phone] cell Available via secure chat

## 2021-05-13 NOTE — Progress Notes (Signed)
°  Subjective:  Patient ID: Miguel [name], male    DOB: June 23, 1950,   MRN: [id number]  Chief Complaint  Patient presents with   Foot Problem    Pt is here to f/u wound check on right foot (2nd) great toe. Reports discharge, swelling, redness. Also mention left great toe swelling.       71 y.o. male presents for follow-up right second toe wound that has severely worsened. Realtes after his last visit his toe started to look worse in a few days but decided to wait until this appointment today. Relates the wound on the tip of the toe has healed but the more proximal part of the toe is significantly red and swollen with a foul odor. Has been in regular shoes.    Has been dressing with betadine. Does have a history of gout.. Left great toe is still swollen but swelling improving.  He is pre-diabetic and last A1c was 6.2. Denies any other pedal complaints. Denies n/v/f/c.   No past medical history on file.  Objective:  Physical Exam: Vascular: DP/PT pulses 2/4 bilateral. CFT <3 seconds. Normal hair growth on digits. No edema.  Skin. No lacerations or abrasions bilateral feet.Left hallux erythema improved some edema still noted. Right second digit distal wound heal however toe is severely swollen red and has a foul odor. Toe appears significantly infected and worse than last visit.   Nails 1-5 are thickened discolored and elongated with subungual debris.  Musculoskeletal: MMT 5/5 bilateral lower extremities in DF, PF, Inversion and Eversion. Deceased ROM in DF of ankle joint. Tender to left hallux IPJ. Minimal pain with ROM.  Neurological: Sensation intact to light touch.   Assessment:   1. Acute osteomyelitis of toe of right foot ([place])   2. Toe swelling   3. Skin ulcer of second toe of right foot, limited to breakdown of skin ([place])          Plan:  Patient was evaluated and treated and all questions answered. -Xrays reviewed . Right distal phalanges on second digit with significant  erosion concerning for osteomyelitis. Severe soft tissue edema noted surrounding toe. .  -Discussed with patient osteomyelitis and infection in the toe. Discussed the unfortunately despite the wound healing underlying infection was present and has infiltrated the bone and infected what appears to be the entire toe. At this time I recommend reporting the the emergency department to be admitted for IV antibiotics as well as MRI. Discussed he will likely need a toe amputation and possible ray amputation depending on MRI results. Patient expressed understanding and will report to his nearest emergency room. Will follow him in the hospital once admitted.     Lorenda Peck, DPM

## 2021-05-13 NOTE — ED Provider Notes (Signed)
[facility] DEPT Provider Note   CSN: [id number] Arrival date & time: 05/13/21  1143     History  Chief Complaint  Patient presents with   Foot Pain    RICHARDSON DUBREE is a 71 y.o. male presented emerged department concern for infection to the toes.  He was in at the podiatry office today and was concerned to come to the ER for medical admission with concern for worsening infection of the right foot, possible osteomyelitis, and may be need for amputation.  Patient reports that he has been on doxycycline as an outpatient, what began as a small ulceration on the right distal second toe has developed into a weeping ulcer with significant edema and pain of the entire toe.  Note by Dr Lorenda Peck, DPM today in office:  "-Xrays reviewed . Right distal phalanges on second digit with significant erosion concerning for osteomyelitis. Severe soft tissue edema noted surrounding toe. .  -Discussed with patient osteomyelitis and infection in the toe. Discussed the unfortunately despite the wound healing underlying infection was present and has infiltrated the bone and infected what appears to be the entire toe. At this time I recommend reporting the the emergency department to be admitted for IV antibiotics as well as MRI. Discussed he will likely need a toe amputation and possible ray amputation depending on MRI results. Patient expressed understanding and will report to his nearest emergency room. Will follow him in the hospital once admitted."         HPI     Home Medications Prior to Admission medications   Medication Sig Start Date End Date Taking? Authorizing Provider  B Complex Vitamins (VITAMIN B COMPLEX) TABS 1 tablet    [provider]  bisoprolol (ZEBETA) 5 MG tablet Take 5 mg by mouth daily. 02/23/21   [provider]  Cholecalciferol (VITAMIN D-3) 125 MCG (5000 UT) TABS 1 tablet    [provider]  colchicine 0.6 MG  tablet TAKE 1 TABLET(0.6 MG) BY MOUTH DAILY FOR 21 DAYS 03/11/21   Lorenda Peck, MD  dutasteride (AVODART) 0.5 MG capsule Take 1 tablet by mouth daily. 02/01/17   [provider]  gabapentin (NEURONTIN) 600 MG tablet 1 tablet 02/01/17   [provider]  losartan-hydrochlorothiazide (HYZAAR) 100-12.5 MG tablet Take 1 tablet by mouth daily. 03/26/17   [provider]  metFORMIN (GLUCOPHAGE-XR) 500 MG 24 hr tablet TAKE 1 TABLET BY MOUTH EVERY DAY WITH THE EVENING MEAL    [provider]  naproxen (NAPROSYN) 500 MG tablet SMARTSIG:1 Tablet(s) By Mouth Every 12 Hours PRN 02/15/21   [provider]  rosuvastatin (CRESTOR) 20 MG tablet Take 1 tablet by mouth daily. 02/23/17   [provider]      Allergies    Fenofibrate, Gemfibrozil, and Simvastatin    Review of Systems   Review of Systems  Physical Exam Updated Vital Signs BP 126/75 (BP Location: Right Arm)    Pulse (!) 55    Temp 97.8 F (36.6 C) (Oral)    Resp 17    Ht 5' 10.25" (1.784 m)    Wt 102.1 kg    SpO2 97%    BMI 32.05 kg/m  Physical Exam Constitutional:      General: He is not in acute distress. HENT:     Head: Normocephalic and atraumatic.  Eyes:     Conjunctiva/sclera: Conjunctivae normal.     Pupils: Pupils are equal, round, and reactive to light.  Cardiovascular:  Rate and Rhythm: Normal rate and regular rhythm.     Pulses: Normal pulses.  Pulmonary:     Effort: Pulmonary effort is normal. No respiratory distress.  Musculoskeletal:     Comments: Edema, weeping wound of the right foot, see photos  Skin:    General: Skin is warm and dry.  Neurological:     General: No focal deficit present.     Mental Status: He is alert and oriented to person, place, and time. Mental status is at baseline.            ED Results / Procedures / Treatments   Labs (all labs ordered are listed, but only abnormal results are displayed) Labs Reviewed  COMPREHENSIVE  METABOLIC PANEL - Abnormal; Notable for the following components:      Result Value   Glucose, Bld 116 (*)    All other components within normal limits  RESP PANEL BY RT-PCR (FLU A&B, COVID) ARPGX2  CULTURE, BLOOD (ROUTINE X 2)  CULTURE, BLOOD (ROUTINE X 2)  LACTIC ACID, PLASMA  CBC WITH DIFFERENTIAL/PLATELET  PROTIME-INR  APTT  URINALYSIS, ROUTINE W REFLEX MICROSCOPIC  HEMOGLOBIN A1C  SEDIMENTATION RATE  C-REACTIVE PROTEIN  PREALBUMIN    EKG EKG Interpretation  Date/Time:  Friday May 13 2021 12:31:15 EST Ventricular Rate:  66 PR Interval:  190 QRS Duration: 84 QT Interval:  410 QTC Calculation: 430 R Axis:   31 Text Interpretation: Sinus rhythm Confirmed by Octaviano Glow [phone]) on 05/13/2021 3:07:15 PM  Radiology MR FOOT RIGHT W WO CONTRAST  Result Date: 05/13/2021 CLINICAL DATA:  Bone mass or bone pain. Aggressive features on x-ray. Osteomyelitis. EXAM: MRI OF THE RIGHT FOREFOOT WITHOUT AND WITH CONTRAST TECHNIQUE: Multiplanar, multisequence MR imaging of the right forefoot. Was performed before and after the administration of intravenous contrast. CONTRAST:  20mL GADAVIST GADOBUTROL 1 MMOL/ML IV SOLN COMPARISON:  Right foot radiographs 05/13/2021, 04/15/2021 . FINDINGS: Despite efforts by the technologist and patient, motion artifact is present on today's exam and could not be eliminated. This reduces exam sensitivity and specificity. Bones/Joint/Cartilage There are again severe degenerative changes of the great toe metatarsophalangeal joint including bone-on-bone contact, extensive subchondral cystic change and marrow edema, and high-grade peripheral degenerative osteophytes. a on today's radiographs there is new erosion of the distal phalanx and middle phalanx of the second toe. On MRI there is high-grade near complete erosion of the distal phalanx. There is diffuse marrow edema throughout the middle phalanx with high-grade dorsal and plantar cortical erosion diffusely  (sagittal a series 8, image 19). There is also mild marrow edema within the proximal phalanx diffusely, with possible minimal cortical erosion of the distal dorsal aspect (sagittal image 19). Findings indicate acute osteomyelitis of the distal and middle phalanges and possible osteomyelitis of the proximal phalanx. Ligaments The Lisfranc ligament complex appears intact. Muscles and Tendons Mild edema within the plantar foot musculature. Soft tissues High-grade soft tissue swelling and thickening of the second toe surrounding the osteomyelitis. Moderate dorsal midfoot soft tissue edema and swelling. IMPRESSION: 1. Osteomyelitis of the distal greater than middle phalanx of second toe. Diffuse marrow edema and possible minimal cortical erosion of the distal aspect of the proximal phalanx of the second toe possibly representing additional osteomyelitis. 2. 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 Electronically Signed   By: Yvonne Kendall M.D.   On: 05/13/2021 13:59   DG Foot Complete Right  Result Date: 05/13/2021 CLINICAL DATA:  Right foot wound to second digit. Skin ulceration with breakdown.  EXAM: RIGHT FOOT COMPLETE - 3+ VIEW COMPARISON:  04/15/2021 FINDINGS: There is no high-grade cortical erosion of the distal phalanx of the second toe. There is decreased density and more mild-to-moderate erosion of the middle phalanx of second toe. High-grade soft tissue swelling. Great toe metatarsophalangeal joint bone-on-bone contact with extensive subchondral sclerosis, subchondral cystic change, and peripheral osteophytosis. Mild hallux valgus, unchanged. Mild dorsal talonavicular degenerative osteophytosis. IMPRESSION:: IMPRESSION: 1. Cortical erosion concerning for osteomyelitis of the distal greater than middle phalanges of the second toe. High-grade soft tissue swelling. 2. Severe great toe metatarsophalangeal joint osteoarthritis, unchanged from prior. Electronically Signed   By: Yvonne Kendall M.D.   On: 05/13/2021 14:00    Procedures Procedures    Medications Ordered in ED Medications  lactated ringers infusion ( Intravenous New Bag/Given 05/13/21 1533)  cefTRIAXone (ROCEPHIN) 2 g in sodium chloride 0.9 % 100 mL IVPB (2 g Intravenous New Bag/Given 05/13/21 1525)  metroNIDAZOLE (FLAGYL) IVPB 500 mg (500 mg Intravenous New Bag/Given 05/13/21 1533)  vancomycin (VANCOREADY) IVPB 2000 mg/400 mL (has no administration in time range)  vancomycin (VANCOCIN) IVPB 1000 mg/200 mL premix (has no administration in time range)  acetaminophen (TYLENOL) tablet 650 mg (has no administration in time range)    Or  acetaminophen (TYLENOL) suppository 650 mg (has no administration in time range)  ondansetron (ZOFRAN) tablet 4 mg (has no administration in time range)    Or  ondansetron (ZOFRAN) injection 4 mg (has no administration in time range)  HYDROmorphone (DILAUDID) injection 1 mg (has no administration in time range)  gadobutrol (GADAVIST) 1 MMOL/ML injection 10 mL (10 mLs Intravenous Contrast Given 05/13/21 1342)    ED Course/ Medical Decision Making/ A&P Clinical Course as of 05/13/21 1601  Fri May 13, 2021  1534 Admitted to Dr Olevia Bowens hospitalist, who will continue to try to contact and confirm with DPM on call plan for possible operative intervention/amputation [MT]    Clinical Course User Index [MT] Wyvonnia Dusky, MD                           Medical Decision Making  This patient presents to the ED with concern for right toe and possible foot infection.  This involves an extensive number of treatment options, and is a complaint that carries with it a high risk of complications and morbidity.  The differential diagnosis includes osteomyelitis versus cellulitis versus other  External records from outside source obtained and reviewed including podiatry office records as noted in history above  I ordered and personally interpreted labs.  The pertinent results include: Normal  lactate, normal white blood cell count, creatinine at baseline level.  Based on his work-up, as well as the patient's clinical presentation of vitals, the lower suspicion for severe sepsis, sepsis.  I ordered imaging studies including x-ray of the foot, MRI of the foot I independently visualized and interpreted imaging which showed concern for osteomyelitis of the right second toe, possible right second phalanges I agree with the radiologist interpretation   Per my interpretation the patient's ECG shows normal sinus rhythm without acute ischemic findings  I ordered medication including empiric antibiotics for osteomyelitis, with MRSA coverage. I have reviewed the patients home medicines and have made adjustments as needed   After the interventions noted above, I reevaluated the patient and found that they have: stayed the same   Dispostion:  After consideration of the diagnostic results and the patients response to treatment, I  feel that the patent would benefit from medical admission for antibiotics, bacteremia rule out, podiatry consultation regarding possible amputation.  Clinical Course as of 05/13/21 1601  Fri May 13, 2021  1534 Admitted to Dr Olevia Bowens hospitalist, who will continue to try to contact and confirm with DPM on call plan for possible operative intervention/amputation [MT]    Clinical Course User Index [MT] Melesio Madara, Carola Rhine, MD           Final Clinical Impression(s) / ED Diagnoses Final diagnoses:  Osteomyelitis of right foot, unspecified type [facility])    Rx / DC Orders ED Discharge Orders     None         Wyvonnia Dusky, MD 05/13/21 [phone]

## 2021-05-13 NOTE — Progress Notes (Signed)
Pharmacy Antibiotic Note  Miguel [name] is a 71 y.o. male admitted on 05/13/2021 with  wound infection/osteo .  Pharmacy has been consulted for vanc dosing. Rocephin and Flagyl per Md  Plan: Vanc 2g IV x 1 then 1g IV q12 - goal AUC 400-550 Flagyl and rocephin per Md  Height: 5' 10.25" (178.4 cm) Weight: 102.1 kg (225 lb) IBW/kg (Calculated) : 73.58  Temp (24hrs), Avg:97.8 F (36.6 C), Min:97.8 F (36.6 C), Max:97.8 F (36.6 C)  Recent Labs  Lab 05/13/21 1222  WBC 6.7  CREATININE 0.80  LATICACIDVEN 1.4    Estimated Creatinine Clearance: 101.8 mL/min (by C-G formula based on SCr of 0.8 mg/dL).    Allergies  Allergen Reactions   Fenofibrate Other (See Comments)   Gemfibrozil Other (See Comments)   Simvastatin Other (See Comments)      Thank you for allowing pharmacy to be a part of this patients care.  Kara Mead 05/13/2021 3:08 PM

## 2021-05-13 NOTE — ED Provider Triage Note (Signed)
Emergency Medicine Provider Triage Evaluation Note  Miguel [name] , a 71 y.o. male  was evaluated in triage.  Pt complains of pt c/o right foot infection. His podiatrist wanted him to be sent to the ED for MRI of the foot and IV abx. States he was recently on doxy without improvement. Xray done earlier today showed osteomyelitis  Review of Systems  Positive: Foot infection Negative: fever  Physical Exam  BP 134/84 (BP Location: Left Arm)    Pulse 64    Temp 97.8 F (36.6 C) (Oral)    Resp 18    Ht 5' 10.25" (1.784 m)    Wt 102.1 kg    SpO2 98%    BMI 32.05 kg/m  Gen:   Awake, no distress   Resp:  Normal effort  MSK:   Moves extremities without difficulty  Other:  Significant swelling, erythema, fluctuance to the right 2nd toe, dp pulse intact and strong       Medical Decision Making  Medically screening exam initiated at 12:16 PM.  Appropriate orders placed.  Miguel [name] was informed that the remainder of the evaluation will be completed by another provider, this initial triage assessment does not replace that evaluation, and the importance of remaining in the ED until their evaluation is complete.     Tivis Ringer, Aylah Yeary S, PA-C 05/13/21 1223

## 2021-05-13 NOTE — Progress Notes (Signed)
ABI's have been completed. Preliminary results can be found in CV Proc through chart review.   05/13/21 4:21 PM Miguel [name] RVT

## 2021-05-14 DIAGNOSIS — M86671 Other chronic osteomyelitis, right ankle and foot: Secondary | ICD-10-CM

## 2021-05-14 DIAGNOSIS — M86679 Other chronic osteomyelitis, unspecified ankle and foot: Secondary | ICD-10-CM

## 2021-05-14 LAB — CBC
HCT: 38.3 % — ABNORMAL LOW (ref 39.0–52.0)
Hemoglobin: 12.5 g/dL — ABNORMAL LOW (ref 13.0–17.0)
MCH: 28.6 pg (ref 26.0–34.0)
MCHC: 32.6 g/dL (ref 30.0–36.0)
MCV: 87.6 fL (ref 80.0–100.0)
Platelets: 232 10*3/uL (ref 150–400)
RBC: 4.37 MIL/uL (ref 4.22–5.81)
RDW: 13.5 % (ref 11.5–15.5)
WBC: 5.5 10*3/uL (ref 4.0–10.5)
nRBC: 0 % (ref 0.0–0.2)

## 2021-05-14 LAB — URINALYSIS, ROUTINE W REFLEX MICROSCOPIC
Bilirubin Urine: NEGATIVE
Glucose, UA: NEGATIVE mg/dL
Hgb urine dipstick: NEGATIVE
Ketones, ur: NEGATIVE mg/dL
Leukocytes,Ua: NEGATIVE
Nitrite: NEGATIVE
Protein, ur: NEGATIVE mg/dL
Specific Gravity, Urine: 1.026 (ref 1.005–1.030)
pH: 6 (ref 5.0–8.0)

## 2021-05-14 LAB — BASIC METABOLIC PANEL
Anion gap: 9 (ref 5–15)
BUN: 18 mg/dL (ref 8–23)
CO2: 26 mmol/L (ref 22–32)
Calcium: 9.3 mg/dL (ref 8.9–10.3)
Chloride: 105 mmol/L (ref 98–111)
Creatinine, Ser: 0.81 mg/dL (ref 0.61–1.24)
GFR, Estimated: >60 mL/min (ref >60)
Glucose, Bld: 118 mg/dL — ABNORMAL HIGH (ref 70–99)
Potassium: 4 mmol/L (ref 3.5–5.1)
Sodium: 140 mmol/L (ref 135–145)

## 2021-05-14 LAB — CBG MONITORING, ED
Glucose-Capillary: 103 mg/dL — ABNORMAL HIGH (ref 70–99)
Glucose-Capillary: 134 mg/dL — ABNORMAL HIGH (ref 70–99)

## 2021-05-14 LAB — SEDIMENTATION RATE: Sed Rate: 40 mm/hr — ABNORMAL HIGH (ref 0–16)

## 2021-05-14 LAB — URIC ACID: Uric Acid, Serum: 6 mg/dL (ref 3.7–8.6)

## 2021-05-14 NOTE — ED Notes (Signed)
Patient up to the bathroom.

## 2021-05-14 NOTE — Progress Notes (Signed)
Inpatient Diabetes Program Recommendations  AACE/ADA: New Consensus Statement on Inpatient Glycemic Control   Target Ranges:  Prepandial:   less than 140 mg/dL      Peak postprandial:   less than 180 mg/dL (1-2 hours)      Critically ill patients:  140 - 180 mg/dL     Latest Reference Range & Units 05/14/21 00:30 05/14/21 05:40  Glucose-Capillary 70 - 99 mg/dL 103 (H) 134 (H)   Review of Glycemic Control  Diabetes history: PreDM Outpatient Diabetes medications: None Current orders for Inpatient glycemic control: None  Inpatient Diabetes Program Recommendations:    Insulin: May want to consider ordering CBGs AC&HS with Novolog 0-6 units TID with meals.  NOTE: Noted consult for Diabetes Coordinator per lower extremity wound order set. Diabetes Coordinator is not on campus over the weekend but available by pager from 8am to 5pm for questions or concerns. Chart reviewed. Lab glucose this morning 134 mg/dl. Would recommend checking glucose and covering with Novolog correction insulin if needed while inpatient. Will sign off consult. Please re-consult if needed.  Thanks, Barnie Alderman, RN, MSN, CDE Diabetes Coordinator Inpatient Diabetes Program [phone] (Team Pager from 8am to 5pm)   Thanks, Barnie Alderman, RN, MSN, CDE Diabetes Coordinator Inpatient Diabetes Program [phone] (Team Pager from 8am to 5pm)

## 2021-05-14 NOTE — ED Notes (Signed)
Patient requesting to leave AMA. Need to take care of pet and taxes. Patient educated on risks of leaving before treatment is complete.

## 2021-05-14 NOTE — Progress Notes (Signed)
Patient has been NPO since midnight. No new orders at this time. Will continue to monitor.

## 2021-05-14 NOTE — Progress Notes (Signed)
Nurse noted that patient drops his oxygen stats during sleep. 2 liters of oxygen via nasal cannula was placed on patient during night shift. No new orders at this time. Will continue to monitor.

## 2021-05-14 NOTE — Consult Note (Signed)
Podiatry Consult Note  To: Dr. Olevia Bowens  Reason for consult: Right 2nd toe osteomyelitis  From: Dr. Cannon Kettle, Podiatry   HPI: Miguel [name] is a 71 y.o. male patient who seen at bedside for evaluation of right second toe osteomyelitis.  Patient reports that he has been seeing Dr. Blenda Mounts outpatient and since the last 2 weeks the toe has worsened states that there was purulent drainage on yesterday and when he saw Dr. Blenda Mounts in office she recommended that he should come to the hospital.  Patient reports that he is ready to go home does not want to stay in the hospital.  Patient denies nausea vomiting fever chills or any constitutional symptoms at this time.  Patient Active Problem List   Diagnosis Date Noted   Osteomyelitis of second toe of right foot ([place]) 05/13/2021   Class 1 obesity 05/13/2021   Benign hypertension 03/10/2021   Benign prostatic hyperplasia 03/10/2021   Bilateral primary osteoarthritis of knee 03/10/2021   ED (erectile dysfunction) of organic origin 03/10/2021   Gout 03/10/2021   Hypercholesterolemia 03/10/2021   Impaired fasting glucose 03/10/2021   Lesion of liver 03/10/2021   Neuropathy 03/10/2021   Morbid obesity ([place]) 03/10/2021   Basal cell carcinoma (BCC) of back 07/31/2019    No current facility-administered medications on file prior to encounter.   Current Outpatient Medications on File Prior to Encounter  Medication Sig Dispense Refill   B Complex Vitamins (VITAMIN B COMPLEX) TABS Take 1 tablet by mouth daily.     bisoprolol (ZEBETA) 5 MG tablet Take 5 mg by mouth daily.     Cholecalciferol (VITAMIN D-3) 125 MCG (5000 UT) TABS Take 5,000 Units by mouth daily.     dutasteride (AVODART) 0.5 MG capsule Take 0.5 mg by mouth daily.     gabapentin (NEURONTIN) 300 MG capsule Take 600 mg by mouth daily in the afternoon.     losartan-hydrochlorothiazide (HYZAAR) 100-12.5 MG tablet Take 1 tablet by mouth daily.     metFORMIN (GLUCOPHAGE-XR) 500 MG 24 hr tablet Take  500 mg by mouth at bedtime.     naproxen (NAPROSYN) 500 MG tablet Take 500-1,000 mg by mouth daily as needed (for pain).     Quercetin 500 MG CAPS Take 500 mg by mouth daily as needed (for allergies).     rosuvastatin (CRESTOR) 20 MG tablet Take 20 mg by mouth every evening.     colchicine 0.6 MG tablet TAKE 1 TABLET(0.6 MG) BY MOUTH DAILY FOR 21 DAYS (Patient not taking: Reported on 05/13/2021) 90 tablet 2    Allergies  Allergen Reactions   Fenofibrate Other (See Comments)    Caused gallstones   Gemfibrozil Other (See Comments)    Severe muscle pain   Simvastatin Other (See Comments)    Severe muscle pain    Past Surgical History:  Procedure Laterality Date   arm surgery     Bicep reattached   TRANSURETHRAL RESECTION OF PROSTATE      Family History  Problem Relation Age of Onset   Heart failure Mother    Prostate cancer Father    Stroke Brother    CAD Brother    CAD Brother     Social History   Socioeconomic History   Marital status: Single    Spouse name: Not on file   Number of children: Not on file   Years of education: Not on file   Highest education level: Not on file  Occupational History   Not on file  Tobacco Use   Smoking status: Never   Smokeless tobacco: Never  Vaping Use   Vaping Use: Never used  Substance and Sexual Activity   Alcohol use: Yes   Drug use: Never   Sexual activity: Not on file  Other Topics Concern   Not on file  Social History Narrative   Not on file   Social Determinants of Health   Financial Resource Strain: Not on file  Food Insecurity: Not on file  Transportation Needs: Not on file  Physical Activity: Not on file  Stress: Not on file  Social Connections: Not on file  Intimate Partner Violence: Not on file     Objective:  Today's Vitals   05/14/21 0400 05/14/21 0500 05/14/21 0600 05/14/21 0700  BP: 137/89 127/78 (!) 154/83 (!) 158/90  Pulse: (!) 58 (!) 57 66 67  Resp: 17 18 18 20   Temp:      TempSrc:       SpO2: 97% 97% 99% 98%  Weight:      Height:      PainSc: 0-No pain      Body mass index is 32.05 kg/m.   General: Alert and oriented x3 in no acute distress  Dermatology: Significant swelling and erythema and multiple open wounds to the right second toe with serosanguineous drainage from the distal tuft of the right second toe with significant cellulitis extending to the level of the forefoot focal warmth, and malodor. See picture of toe in media section of chart  Vascular: Dorsalis Pedis and Posterior Tibial pedal pulses are difficult to palpate due to soft tissue swelling  Neurology: Gross sensation intact via light touch bilateral.  Musculoskeletal: Minimal tenderness to palpation of the right foot.  Results MRI right foot IMPRESSION: 1. Osteomyelitis of the distal greater than middle phalanx of second toe. Diffuse marrow edema and possible minimal cortical erosion of the distal aspect of the proximal phalanx of the second toe possibly representing additional osteomyelitis. 2. 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    X-ray right foot IMPRESSION: 1. Cortical erosion concerning for osteomyelitis of the distal greater than middle phalanges of the second toe. High-grade soft tissue swelling. 2. Severe great toe metatarsophalangeal joint osteoarthritis, unchanged from prior    Results for orders placed or performed during the hospital encounter of 05/13/21  Resp Panel by RT-PCR (Flu A&B, Covid) Nasopharyngeal Swab     Status: None   Collection Time: 05/13/21 12:22 PM   Specimen: Nasopharyngeal Swab; Nasopharyngeal(NP) swabs in vial transport medium  Result Value Ref Range Status   SARS Coronavirus 2 by RT PCR NEGATIVE NEGATIVE Final    Comment: (NOTE) SARS-CoV-2 target nucleic acids are NOT DETECTED.  The SARS-CoV-2 RNA is generally detectable in upper respiratory specimens during the acute phase of infection. The lowest concentration of  SARS-CoV-2 viral copies this assay can detect is 138 copies/mL. A negative result does not preclude SARS-Cov-2 infection and should not be used as the sole basis for treatment or other patient management decisions. A negative result may occur with  improper specimen collection/handling, submission of specimen other than nasopharyngeal swab, presence of viral mutation(s) within the areas targeted by this assay, and inadequate number of viral copies(<138 copies/mL). A negative result must be combined with clinical observations, patient history, and epidemiological information. The expected result is Negative.  Fact Sheet for Patients:  EntrepreneurPulse.com.au  Fact Sheet for Healthcare Providers:  IncredibleEmployment.be  This test is no t yet approved or cleared by the [place]  States FDA and  has been authorized for detection and/or diagnosis of SARS-CoV-2 by FDA under an Emergency Use Authorization (EUA). This EUA will remain  in effect (meaning this test can be used) for the duration of the COVID-19 declaration under Section 564(b)(1) of the Act, 21 U.S.C.section 360bbb-3(b)(1), unless the authorization is terminated  or revoked sooner.       Influenza A by PCR NEGATIVE NEGATIVE Final   Influenza B by PCR NEGATIVE NEGATIVE Final    Comment: (NOTE) The Xpert Xpress SARS-CoV-2/FLU/RSV plus assay is intended as an aid in the diagnosis of influenza from Nasopharyngeal swab specimens and should not be used as a sole basis for treatment. Nasal washings and aspirates are unacceptable for Xpert Xpress SARS-CoV-2/FLU/RSV testing.  Fact Sheet for Patients: EntrepreneurPulse.com.au  Fact Sheet for Healthcare Providers: IncredibleEmployment.be  This test is not yet approved or cleared by the [place] FDA and has been authorized for detection and/or diagnosis of SARS-CoV-2 by FDA under an Emergency Use  Authorization (EUA). This EUA will remain in effect (meaning this test can be used) for the duration of the COVID-19 declaration under Section 564(b)(1) of the Act, 21 U.S.C. section 360bbb-3(b)(1), unless the authorization is terminated or revoked.  Performed at [facility], [place] [address], [place], [place] [address]      Assessment and Plan: Problem List Items Addressed This Visit   None Visit Diagnoses     Osteomyelitis of right foot, unspecified type ([place])    -  Primary   Relevant Medications   cefTRIAXone (ROCEPHIN) 2 g in sodium chloride 0.9 % 100 mL IVPB   metroNIDAZOLE (FLAGYL) IVPB 500 mg   vancomycin (VANCOREADY) IVPB 2000 mg/400 mL (Completed)   vancomycin (VANCOCIN) IVPB 1000 mg/200 mL premix        -Complete examination performed -Xrays and MRI reviewed -Discussed treatment options for management of gout and osteomyelitis of right second toe.  Patient is refusing surgery or amputation at this time and is requesting to go home, states that he thought he was going to come here to get the medical work-up and then go home and follow back up with Dr. Blenda Mounts outpatient.  I reiterated to him that Dr. Blenda Mounts wanted him to stay and be admitted to have surgery and that is the current plan however at this time patient is refusing surgery.  This was discussed with hospitalist.  Patient is encouraged to stay and not leave [place]. -Recommend continue with broad-spectrum antibiotics if patient decides to stay in hospital I will revisit with him later today or tomorrow to plan for amputation -Recommend rest and elevation for pain and edema control -Consult appreciated -Podiatry to follow if he remains inpatient.  Dr. Landis Martins, [place] [facility] [place] [phone] office [phone] cell  Time spent with patient for exam and coordination of care: 30   mins

## 2021-05-14 NOTE — Discharge Summary (Signed)
[facility] Discharge Summary   Patient: Miguel [name] [id number]:[id number]   PCP: Miguel Caraway, MD DOB: June 24, 1950   Date of admission: 05/13/2021   Date of discharge: 05/14/2021    Discharge Diagnoses:  Principal Problem:   Osteomyelitis of second toe of right foot ([place]) Active Problems:   Benign hypertension   Benign prostatic hyperplasia   Gout   Hypercholesterolemia   Impaired fasting glucose   Neuropathy   Class 1 obesity   Discharge Condition: patient left AMA  History of present illness: As per the H and P dictated on admission, " KALEP [name] is a 71 y.o. male with medical history significant of hypertension, BPH, gout, hyperlipidemia, impaired fasting glucose, neuropathy, class I obesity who was having a 2-week follow-up today with podiatry for a left toe wound which has significantly worsened.  Denies fever, chills, night sweats, decreased appetite, fatigue, malaise, sore throat, wheezing, dyspnea or hemoptysis.  No chest pain, palpitations, diaphoresis, PND, orthopnea or pitting edema of the lower extremities.  Denied abdominal pain, nausea, emesis, diarrhea, constipation, melena or hematochezia.  No flank pain, dysuria, frequency or hematuria.  No polyuria, polydipsia, polyphagia or blurred vision.   ED Course: Initial vital signs were temperature 97.8 F, pulse 64, respiration 18, BP 134/84 mmHg O2 sat 98% on room air.  The patient received vancomycin and ceftriaxone in the emergency department.   Lab work: CBC showed a white count 6.7, hemoglobin 13.0 g/dL platelets 264.  Normal PT, INR and PTT.  Lactic acid unremarkable.  CMP showed a glucose of 160 mg/dL, but was otherwise unremarkable.   Imaging: Right foot x-ray showed cortical erosion concerning for osteomyelitis of the distal and middle phalanges of the second toe.  Osteomyelitis was confirmed by MRI.  Please see images and [name] radiology report for further details."  Hospital Course:  The patient presented to  the ER from podiatry's office due to worsening right second toe infection. Work-up in the ER is positive for osteomyelitis of the distal middle phalanx as well as proximal phalanx of the second based on the MRI .  Patient also reported rapidly worsening cellulitis of the right lower extremity with streaking going all the way up to half of the forefoot. Patient was started on broad-spectrum IV antibiotics in the ER. Podiatry was consulted. Podiatry recommended amputation and inpatient stay. Patient decided that he wants to leave the hospital [facility] as he has to take care of his pet. Explained patient clearly that he remains at risk for progressive sepsis and as he is reporting the rapidity of the cellulitis worsening over less than 24-hour timeline at risk for significant morbidity and mortality. Patient still left AMA.  summary of his active problems in the hospital is as following.   Osteomyelitis of second toe of right foot (HCC) Kept n.p.o. after midnight. Treated with ceftriaxone 2 g daily and vancomycin per pharmacy. Analgesics as needed. Podiatry was consulted, patient was recommended to undergo amputation.  Patient was willing to go through amputation but was not willing to stay in the hospital.     Benign hypertension Continue losartan/HCTZ. Monitor BP, renal function electrolytes.     Benign prostatic hyperplasia Continue Avodart.     Gout On colchicine.     Hypercholesterolemia Continue rosuvastatin 20 mg p.o. daily.     Impaired fasting glucose Placed on sliding scale insulin.     Neuropathy Continue gabapentin.     Class 1 obesity Lifestyle modifications. Follow-up with PCP.  patient left  AMA  Procedures and Results: Lower extremity ABI shows adequate circulation  Consultations: Podiatry  The results of significant diagnostics from this hospitalization (including imaging, microbiology, ancillary and laboratory) are listed below for  reference.    Significant Diagnostic Studies: MR FOOT RIGHT W WO CONTRAST  Result Date: 05/13/2021 CLINICAL DATA:  Bone mass or bone pain. Aggressive features on x-ray. Osteomyelitis. EXAM: MRI OF THE RIGHT FOREFOOT WITHOUT AND WITH CONTRAST TECHNIQUE: Multiplanar, multisequence MR imaging of the right forefoot. Was performed before and after the administration of intravenous contrast. CONTRAST:  19mL GADAVIST GADOBUTROL 1 MMOL/ML IV SOLN COMPARISON:  Right foot radiographs 05/13/2021, 04/15/2021 . FINDINGS: Despite efforts by the technologist and patient, motion artifact is present on today's exam and could not be eliminated. This reduces exam sensitivity and specificity. Bones/Joint/Cartilage There are again severe degenerative changes of the great toe metatarsophalangeal joint including bone-on-bone contact, extensive subchondral cystic change and marrow edema, and high-grade peripheral degenerative osteophytes. a on today's radiographs there is new erosion of the distal phalanx and middle phalanx of the second toe. On MRI there is high-grade near complete erosion of the distal phalanx. There is diffuse marrow edema throughout the middle phalanx with high-grade dorsal and plantar cortical erosion diffusely (sagittal a series 8, image 19). There is also mild marrow edema within the proximal phalanx diffusely, with possible minimal cortical erosion of the distal dorsal aspect (sagittal image 19). Findings indicate acute osteomyelitis of the distal and middle phalanges and possible osteomyelitis of the proximal phalanx. Ligaments The Lisfranc ligament complex appears intact. Muscles and Tendons Mild edema within the plantar foot musculature. Soft tissues High-grade soft tissue swelling and thickening of the second toe surrounding the osteomyelitis. Moderate dorsal midfoot soft tissue edema and swelling. IMPRESSION: 1. Osteomyelitis of the distal greater than middle phalanx of second toe. Diffuse marrow edema  and possible minimal cortical erosion of the distal aspect of the proximal phalanx of the second toe possibly representing additional osteomyelitis. 2. 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 Electronically Signed   By: Yvonne Kendall M.D.   On: 05/13/2021 13:59   DG Foot Complete Right  Result Date: 05/13/2021 CLINICAL DATA:  Right foot wound to second digit. Skin ulceration with breakdown. EXAM: RIGHT FOOT COMPLETE - 3+ VIEW COMPARISON:  04/15/2021 FINDINGS: There is no high-grade cortical erosion of the distal phalanx of the second toe. There is decreased density and more mild-to-moderate erosion of the middle phalanx of second toe. High-grade soft tissue swelling. Great toe metatarsophalangeal joint bone-on-bone contact with extensive subchondral sclerosis, subchondral cystic change, and peripheral osteophytosis. Mild hallux valgus, unchanged. Mild dorsal talonavicular degenerative osteophytosis. IMPRESSION:: IMPRESSION: 1. Cortical erosion concerning for osteomyelitis of the distal greater than middle phalanges of the second toe. High-grade soft tissue swelling. 2. Severe great toe metatarsophalangeal joint osteoarthritis, unchanged from prior. Electronically Signed   By: Yvonne Kendall M.D.   On: 05/13/2021 14:00   DG Foot Complete Right  Result Date: 04/16/2021 CLINICAL DATA:  Right toe ulceration, redness EXAM: RIGHT FOOT COMPLETE - 3+ VIEW COMPARISON:  None. FINDINGS: Exuberant endplate spurring about the first MTP joint with extensive subchondral erosive changes. Subchondral cysts or geodes in the first metatarsal head and in the base of the great toe proximal phalanx with surrounding sclerosis. There is a mild hallux valgus deformity. Regional soft tissue swelling. No subcutaneous gas or radiodense foreign body. Remainder of right foot unremarkable. IMPRESSION: Advanced erosive DJD of the first MTP joint with exuberant spurring, and mild hallux  valgus deformity.  Electronically Signed   By: Lucrezia Europe M.D.   On: 04/16/2021 12:01   VAS [place] ABI WITH/WO TBI  Result Date: 05/13/2021  LOWER EXTREMITY DOPPLER STUDY Patient Name:  RAMELLO CORDIAL  Date of Exam:   05/13/2021 Medical Rec #: [id number]        Accession #:    [id number] Date of Birth: [birth year]         Patient Gender: M Patient Age:   33 years Exam Location:  [facility] Procedure:      VAS [place] ABI WITH/WO TBI Referring Phys: DAVID ORTIZ --------------------------------------------------------------------------------  Indications: Ulceration. High Risk Factors: Hypertension.  Limitations: Today's exam was limited due to an open wound, involuntary patient              movement and Great toe diameter. Comparison Study: No prior studies. Performing Technologist: Carlos Levering RVT  Examination Guidelines: A complete evaluation includes at minimum, Doppler waveform signals and systolic blood pressure reading at the level of bilateral brachial, anterior tibial, and posterior tibial arteries, when vessel segments are accessible. Bilateral testing is considered an integral part of a complete examination. Photoelectric Plethysmograph (PPG) waveforms and toe systolic pressure readings are included as required and additional duplex testing as needed. Limited examinations for reoccurring indications may be performed as noted.  ABI Findings: +--------+------------------+-----+---------+--------+  Right    Rt Pressure (mmHg) Index Waveform  Comment   +--------+------------------+-----+---------+--------+  Brachial 138                      triphasic           +--------+------------------+-----+---------+--------+  PTA      178                1.25  triphasic           +--------+------------------+-----+---------+--------+  DP       166                1.17  biphasic            +--------+------------------+-----+---------+--------+ +--------+------------------+-----+---------+-------+  Left     Lt Pressure (mmHg) Index Waveform   Comment  +--------+------------------+-----+---------+-------+  Brachial 142                      triphasic          +--------+------------------+-----+---------+-------+  PTA      166                1.17  triphasic          +--------+------------------+-----+---------+-------+  DP       162                1.14  triphasic          +--------+------------------+-----+---------+-------+ +-------+-----------+-----------+------------+------------+  ABI/TBI Today's ABI Today's TBI Previous ABI Previous TBI  +-------+-----------+-----------+------------+------------+  Right   1.25                                               +-------+-----------+-----------+------------+------------+  Left    1.17                                               +-------+-----------+-----------+------------+------------+  Summary: Right: Resting right ankle-brachial index is within normal range. No evidence of significant right lower extremity arterial disease. Unable to obtain TBI due to great toe diameter. Left: Resting left ankle-brachial index is within normal range. No evidence of significant left lower extremity arterial disease. Unable to obtain TBI due to great toe diameter.  *See table(s) above for measurements and observations.  Electronically signed by Jamelle Haring on 05/13/2021 at 4:53:02 PM.    Final     Microbiology: Recent Results (from the past 240 hour(s))  Resp Panel by RT-PCR (Flu A&B, Covid) Nasopharyngeal Swab     Status: None   Collection Time: 05/13/21 12:22 PM   Specimen: Nasopharyngeal Swab; Nasopharyngeal(NP) swabs in vial transport medium  Result Value Ref Range Status   SARS Coronavirus 2 by RT PCR NEGATIVE NEGATIVE Final    Comment: (NOTE) SARS-CoV-2 target nucleic acids are NOT DETECTED.  The SARS-CoV-2 RNA is generally detectable in upper respiratory specimens during the acute phase of infection. The lowest concentration of SARS-CoV-2 viral copies this assay can detect is 138 copies/mL. A  negative result does not preclude SARS-Cov-2 infection and should not be used as the sole basis for treatment or other patient management decisions. A negative result may occur with  improper specimen collection/handling, submission of specimen other than nasopharyngeal swab, presence of viral mutation(s) within the areas targeted by this assay, and inadequate number of viral copies(<138 copies/mL). A negative result must be combined with clinical observations, patient history, and epidemiological information. The expected result is Negative.  Fact Sheet for Patients:  EntrepreneurPulse.com.au  Fact Sheet for Healthcare Providers:  IncredibleEmployment.be  This test is no t yet approved or cleared by the [place] FDA and  has been authorized for detection and/or diagnosis of SARS-CoV-2 by FDA under an Emergency Use Authorization (EUA). This EUA will remain  in effect (meaning this test can be used) for the duration of the COVID-19 declaration under Section 564(b)(1) of the Act, 21 U.S.C.section 360bbb-3(b)(1), unless the authorization is terminated  or revoked sooner.       Influenza A by PCR NEGATIVE NEGATIVE Final   Influenza B by PCR NEGATIVE NEGATIVE Final    Comment: (NOTE) The Xpert Xpress SARS-CoV-2/FLU/RSV plus assay is intended as an aid in the diagnosis of influenza from Nasopharyngeal swab specimens and should not be used as a sole basis for treatment. Nasal washings and aspirates are unacceptable for Xpert Xpress SARS-CoV-2/FLU/RSV testing.  Fact Sheet for Patients: EntrepreneurPulse.com.au  Fact Sheet for Healthcare Providers: IncredibleEmployment.be  This test is not yet approved or cleared by the [place] FDA and has been authorized for detection and/or diagnosis of SARS-CoV-2 by FDA under an Emergency Use Authorization (EUA). This EUA will remain in effect (meaning this test can  be used) for the duration of the COVID-19 declaration under Section 564(b)(1) of the Act, 21 U.S.C. section 360bbb-3(b)(1), unless the authorization is terminated or revoked.  Performed at [facility], [place] [address] [place], [place] [address]   Blood Culture (routine x 2)     Status: None (Preliminary result)   Collection Time: 05/13/21 12:22 PM   Specimen: BLOOD  Result Value Ref Range Status   Specimen Description   Final    BLOOD BLOOD LEFT FOREARM Performed at [facility] [address] [place], [place] [address]    Special Requests   Final    BOTTLES DRAWN AEROBIC AND ANAEROBIC Blood Culture results may not be optimal due to an  inadequate volume of blood received in culture bottles Performed at [facility] [address] [place], [place] [address]    Culture   Final    NO GROWTH 1 DAY Performed at [facility], [place] [address], [place], [place] [address]    Report Status PENDING  Incomplete  Blood Culture (routine x 2)     Status: None (Preliminary result)   Collection Time: 05/13/21 12:27 PM   Specimen: BLOOD  Result Value Ref Range Status   Specimen Description   Final    BLOOD BLOOD RIGHT FOREARM Performed at [facility] [address] [place], [place] [address]    Special Requests   Final    Blood Culture adequate volume BOTTLES DRAWN AEROBIC AND ANAEROBIC Performed at [facility] [address], [place], [name] [address]    Culture   Final    NO GROWTH < 24 HOURS Performed at [facility] [address], [place], [place] [address]    Report Status PENDING  Incomplete     Labs: CBC: Recent Labs  Lab 05/13/21 1222 05/14/21 0500  WBC 6.7 5.5  NEUTROABS 4.9  --   HGB 13.0 12.5*  HCT 40.6 38.3*  MCV 88.5 87.6  PLT 264 872   Basic Metabolic Panel: Recent Labs  Lab 05/13/21 1222 05/14/21 0500  NA 139 140  K 4.2 4.0  CL 105 105   CO2 27 26  GLUCOSE 116* 118*  BUN 20 18  CREATININE 0.80 0.81  CALCIUM 9.3 9.3   Liver Function Tests: Recent Labs  Lab 05/13/21 1222  AST 16  ALT 20  ALKPHOS 39  BILITOT 0.7  PROT 7.3  ALBUMIN 3.9   No results for input(s): LIPASE, AMYLASE in the last 168 hours. No results for input(s): AMMONIA in the last 168 hours. Cardiac Enzymes: No results for input(s): CKTOTAL, CKMB, CKMBINDEX, TROPONINI in the last 168 hours. BNP (last 3 results) No results for input(s): BNP in the last 8760 hours. CBG: Recent Labs  Lab 05/13/21 1801 05/13/21 2012 05/14/21 0030 05/14/21 0540  GLUCAP 68* 117* 103* 134*   Time spent: 40 minutes  Signed:  Berle Mull  [facility] 05/14/2021, 5:11 PM

## 2021-05-15 ENCOUNTER — Other Ambulatory Visit: Payer: Self-pay

## 2021-05-15 ENCOUNTER — Encounter (HOSPITAL_COMMUNITY): Payer: Self-pay | Admitting: Emergency Medicine

## 2021-05-15 ENCOUNTER — Inpatient Hospital Stay (HOSPITAL_COMMUNITY)
Admission: EM | Admit: 2021-05-15 | Discharge: 2021-05-18 | DRG: 475 | Disposition: A | Discharge status: Home / Self Care | Payer: Medicare (Managed Care) | Attending: Internal Medicine | Admitting: Internal Medicine

## 2021-05-15 DIAGNOSIS — Z89421 Acquired absence of other right toe(s): Secondary | ICD-10-CM

## 2021-05-15 DIAGNOSIS — Z20822 Contact with and (suspected) exposure to covid-19: Secondary | ICD-10-CM | POA: Diagnosis not present

## 2021-05-15 DIAGNOSIS — Z6832 Body mass index (BMI) 32.0-32.9, adult: Secondary | ICD-10-CM

## 2021-05-15 DIAGNOSIS — M869 Osteomyelitis, unspecified: Secondary | ICD-10-CM

## 2021-05-15 DIAGNOSIS — R7301 Impaired fasting glucose: Secondary | ICD-10-CM | POA: Diagnosis present

## 2021-05-15 DIAGNOSIS — E7800 Pure hypercholesterolemia, unspecified: Secondary | ICD-10-CM | POA: Diagnosis present

## 2021-05-15 DIAGNOSIS — E669 Obesity, unspecified: Secondary | ICD-10-CM | POA: Diagnosis present

## 2021-05-15 DIAGNOSIS — M1990 Unspecified osteoarthritis, unspecified site: Secondary | ICD-10-CM | POA: Diagnosis not present

## 2021-05-15 DIAGNOSIS — N400 Enlarged prostate without lower urinary tract symptoms: Secondary | ICD-10-CM | POA: Diagnosis not present

## 2021-05-15 DIAGNOSIS — Z823 Family history of stroke: Secondary | ICD-10-CM | POA: Diagnosis not present

## 2021-05-15 DIAGNOSIS — L03115 Cellulitis of right lower limb: Secondary | ICD-10-CM | POA: Diagnosis present

## 2021-05-15 DIAGNOSIS — E1169 Type 2 diabetes mellitus with other specified complication: Secondary | ICD-10-CM | POA: Diagnosis not present

## 2021-05-15 DIAGNOSIS — M659 Synovitis and tenosynovitis, unspecified: Secondary | ICD-10-CM | POA: Diagnosis not present

## 2021-05-15 DIAGNOSIS — Z888 Allergy status to other drugs, medicaments and biological substances status: Secondary | ICD-10-CM

## 2021-05-15 DIAGNOSIS — M86171 Other acute osteomyelitis, right ankle and foot: Secondary | ICD-10-CM | POA: Diagnosis not present

## 2021-05-15 DIAGNOSIS — Z8042 Family history of malignant neoplasm of prostate: Secondary | ICD-10-CM | POA: Diagnosis not present

## 2021-05-15 DIAGNOSIS — G629 Polyneuropathy, unspecified: Secondary | ICD-10-CM | POA: Diagnosis not present

## 2021-05-15 DIAGNOSIS — Z79899 Other long term (current) drug therapy: Secondary | ICD-10-CM | POA: Diagnosis not present

## 2021-05-15 DIAGNOSIS — E66811 Obesity, class 1: Secondary | ICD-10-CM | POA: Diagnosis present

## 2021-05-15 DIAGNOSIS — I10 Essential (primary) hypertension: Secondary | ICD-10-CM | POA: Diagnosis not present

## 2021-05-15 DIAGNOSIS — Z7984 Long term (current) use of oral hypoglycemic drugs: Secondary | ICD-10-CM

## 2021-05-15 DIAGNOSIS — L03031 Cellulitis of right toe: Secondary | ICD-10-CM | POA: Diagnosis present

## 2021-05-15 DIAGNOSIS — I96 Gangrene, not elsewhere classified: Secondary | ICD-10-CM | POA: Diagnosis not present

## 2021-05-15 DIAGNOSIS — L97514 Non-pressure chronic ulcer of other part of right foot with necrosis of bone: Secondary | ICD-10-CM | POA: Diagnosis not present

## 2021-05-15 DIAGNOSIS — Z8249 Family history of ischemic heart disease and other diseases of the circulatory system: Secondary | ICD-10-CM

## 2021-05-15 DIAGNOSIS — M109 Gout, unspecified: Secondary | ICD-10-CM | POA: Diagnosis present

## 2021-05-15 LAB — BASIC METABOLIC PANEL
Anion gap: 8 (ref 5–15)
BUN: 19 mg/dL (ref 8–23)
CO2: 27 mmol/L (ref 22–32)
Calcium: 9.2 mg/dL (ref 8.9–10.3)
Chloride: 104 mmol/L (ref 98–111)
Creatinine, Ser: 0.74 mg/dL (ref 0.61–1.24)
GFR, Estimated: >60 mL/min (ref >60)
Glucose, Bld: 114 mg/dL — ABNORMAL HIGH (ref 70–99)
Potassium: 3.7 mmol/L (ref 3.5–5.1)
Sodium: 139 mmol/L (ref 135–145)

## 2021-05-15 LAB — GLUCOSE, CAPILLARY
Glucose-Capillary: 85 mg/dL (ref 70–99)
Glucose-Capillary: 96 mg/dL (ref 70–99)

## 2021-05-15 LAB — CBC WITH DIFFERENTIAL/PLATELET
Abs Immature Granulocytes: 0.01 10*3/uL (ref 0.00–0.07)
Basophils Absolute: 0 10*3/uL (ref 0.0–0.1)
Basophils Relative: 0 %
Eosinophils Absolute: 0.1 10*3/uL (ref 0.0–0.5)
Eosinophils Relative: 2 %
HCT: 40.2 % (ref 39.0–52.0)
Hemoglobin: 13 g/dL (ref 13.0–17.0)
Immature Granulocytes: 0 %
Lymphocytes Relative: 16 %
Lymphs Abs: 1.1 10*3/uL (ref 0.7–4.0)
MCH: 28.1 pg (ref 26.0–34.0)
MCHC: 32.3 g/dL (ref 30.0–36.0)
MCV: 87 fL (ref 80.0–100.0)
Monocytes Absolute: 0.5 10*3/uL (ref 0.1–1.0)
Monocytes Relative: 7 %
Neutro Abs: 5.1 10*3/uL (ref 1.7–7.7)
Neutrophils Relative %: 75 %
Platelets: 266 10*3/uL (ref 150–400)
RBC: 4.62 MIL/uL (ref 4.22–5.81)
RDW: 13.7 % (ref 11.5–15.5)
WBC: 6.8 10*3/uL (ref 4.0–10.5)
nRBC: 0 % (ref 0.0–0.2)

## 2021-05-15 MED ORDER — ACETAMINOPHEN 650 MG RE SUPP: 650.0000 mg | Freq: Four times a day (QID) | RECTAL | Status: DC | PRN

## 2021-05-15 MED ORDER — ROSUVASTATIN CALCIUM 20 MG PO TABS
20.0000 mg | ORAL_TABLET | Freq: Every evening | ORAL | Status: DC
  Administered 2021-05-15 – 2021-05-17 (×3): 20 mg via ORAL
  Filled 2021-05-15 (×3): qty 1

## 2021-05-15 MED ORDER — COLCHICINE 0.6 MG PO TABS
0.6000 mg | ORAL_TABLET | Freq: Every day | ORAL | Status: DC
Start: 2021-05-16 — End: 2021-05-15

## 2021-05-15 MED ORDER — OXYCODONE HCL 5 MG PO TABS
5.0000 mg | ORAL_TABLET | Freq: Four times a day (QID) | ORAL | Status: DC | PRN
  Administered 2021-05-16: 5 mg via ORAL
  Filled 2021-05-15: qty 1

## 2021-05-15 MED ORDER — DUTASTERIDE 0.5 MG PO CAPS
0.5000 mg | ORAL_CAPSULE | Freq: Every day | ORAL | Status: DC
  Administered 2021-05-16 – 2021-05-17 (×3): 0.5 mg via ORAL
  Filled 2021-05-15 (×4): qty 1

## 2021-05-15 MED ORDER — SODIUM CHLORIDE 0.9 % IV SOLN
2.0000 g | INTRAVENOUS | Status: DC
  Administered 2021-05-16 – 2021-05-17 (×2): 2 g via INTRAVENOUS
  Filled 2021-05-15 (×3): qty 20

## 2021-05-15 MED ORDER — METFORMIN HCL ER 500 MG PO TB24
500.0000 mg | ORAL_TABLET | Freq: Every day | ORAL | Status: DC
  Administered 2021-05-15: 500 mg via ORAL
  Filled 2021-05-15: qty 1

## 2021-05-15 MED ORDER — LOSARTAN POTASSIUM 50 MG PO TABS
100.0000 mg | ORAL_TABLET | Freq: Every day | ORAL | Status: DC
  Administered 2021-05-15 – 2021-05-18 (×4): 100 mg via ORAL
  Filled 2021-05-15 (×4): qty 2

## 2021-05-15 MED ORDER — ONDANSETRON HCL 4 MG PO TABS: 4.0000 mg | ORAL_TABLET | Freq: Four times a day (QID) | ORAL | Status: DC | PRN

## 2021-05-15 MED ORDER — SODIUM CHLORIDE 0.9 % IV SOLN
2.0000 g | Freq: Once | INTRAVENOUS | Status: AC
  Administered 2021-05-15: 2 g via INTRAVENOUS
  Filled 2021-05-15: qty 20

## 2021-05-15 MED ORDER — ACETAMINOPHEN 325 MG PO TABS
650.0000 mg | ORAL_TABLET | Freq: Four times a day (QID) | ORAL | Status: DC | PRN
  Filled 2021-05-15: qty 2

## 2021-05-15 MED ORDER — LOSARTAN POTASSIUM-HCTZ 100-12.5 MG PO TABS
1.0000 | ORAL_TABLET | Freq: Every day | ORAL | Status: DC
Start: 2021-05-15 — End: 2021-05-15

## 2021-05-15 MED ORDER — HYDROCHLOROTHIAZIDE 12.5 MG PO TABS
12.5000 mg | ORAL_TABLET | Freq: Every day | ORAL | Status: DC
  Administered 2021-05-15: 12.5 mg via ORAL
  Filled 2021-05-15: qty 1

## 2021-05-15 MED ORDER — BISOPROLOL FUMARATE 5 MG PO TABS
5.0000 mg | ORAL_TABLET | Freq: Every day | ORAL | Status: DC
  Administered 2021-05-16 – 2021-05-18 (×3): 5 mg via ORAL
  Filled 2021-05-15 (×3): qty 1

## 2021-05-15 MED ORDER — ONDANSETRON HCL 4 MG/2ML IJ SOLN
4.0000 mg | Freq: Four times a day (QID) | INTRAMUSCULAR | Status: DC | PRN
Start: 2021-05-15 — End: 2021-05-18

## 2021-05-15 MED ORDER — VANCOMYCIN HCL 1250 MG/250ML IV SOLN
1250.0000 mg | Freq: Two times a day (BID) | INTRAVENOUS | Status: DC
  Administered 2021-05-16 – 2021-05-17 (×3): 1250 mg via INTRAVENOUS
  Filled 2021-05-15 (×4): qty 250

## 2021-05-15 MED ORDER — VANCOMYCIN HCL 2000 MG/400ML IV SOLN
2000.0000 mg | Freq: Once | INTRAVENOUS | Status: AC
  Administered 2021-05-15: 2000 mg via INTRAVENOUS
  Filled 2021-05-15: qty 400

## 2021-05-15 MED ORDER — GABAPENTIN 300 MG PO CAPS
600.0000 mg | ORAL_CAPSULE | Freq: Every day | ORAL | Status: DC
Start: 2021-05-15 — End: 2021-05-18
  Administered 2021-05-15 – 2021-05-18 (×3): 600 mg via ORAL
  Filled 2021-05-15 (×3): qty 2

## 2021-05-15 NOTE — Progress Notes (Signed)
A consult was received from an ED physician for Vancomycin per pharmacy dosing.  The patient's profile has been reviewed for ht/wt/allergies/indication/available labs.    A one time order has been placed for Vancomycin 2gm.  Further antibiotics/pharmacy consults should be ordered by admitting physician if indicated.                       Thank you,  Minda Ditto PharmD 05/15/2021  1:02 PM

## 2021-05-15 NOTE — ED Provider Notes (Signed)
[facility] DEPT Provider Note   CSN: [id number] Arrival date & time: 05/15/21  1154     History  Chief Complaint  Patient presents with   Wound Infection    Miguel [name] is a 71 y.o. male.  Pt is a 71 yo wm with a hx of hypertension, BPH, gout, hyperlipidemia, impaired fasting glucose, neuropathy, obesity, and osteomyelitis of the 2nd toe of the right foot.  Pt was admitted on 1/13 for osteomyelitis.  He was put on ceftriaxone and vancomycin.  Podiatry was consulted for the osteomyelitis and there was a plan for amputation.  However, pt said he had to put his affairs in order and could not get surgery, so he left AMA.  Pt said he has put everything in order and is back today to be re-admitted for IV abx and amputation.  He denies any pain.        Home Medications Prior to Admission medications   Medication Sig Start Date End Date Taking? Authorizing Provider  B Complex Vitamins (VITAMIN B COMPLEX) TABS Take 1 tablet by mouth daily.    [provider]  bisoprolol (ZEBETA) 5 MG tablet Take 5 mg by mouth daily. 02/23/21   [provider]  Cholecalciferol (VITAMIN D-3) 125 MCG (5000 UT) TABS Take 5,000 Units by mouth daily.    [provider]  colchicine 0.6 MG tablet TAKE 1 TABLET(0.6 MG) BY MOUTH DAILY FOR 21 DAYS Patient not taking: Reported on 05/13/2021 03/11/21   Lorenda Peck, MD  dutasteride (AVODART) 0.5 MG capsule Take 0.5 mg by mouth daily. 02/01/17   [provider]  gabapentin (NEURONTIN) 300 MG capsule Take 600 mg by mouth daily in the afternoon. 04/21/21   [provider]  losartan-hydrochlorothiazide (HYZAAR) 100-12.5 MG tablet Take 1 tablet by mouth daily. 03/26/17   [provider]  metFORMIN (GLUCOPHAGE-XR) 500 MG 24 hr tablet Take 500 mg by mouth at bedtime.    [provider]  naproxen (NAPROSYN) 500 MG tablet Take 500-1,000 mg by mouth daily as needed (for pain).  02/15/21   [provider]  Quercetin 500 MG CAPS Take 500 mg by mouth daily as needed (for allergies).    [provider]  rosuvastatin (CRESTOR) 20 MG tablet Take 20 mg by mouth every evening. 02/23/17   [provider]      Allergies    Fenofibrate, Gemfibrozil, and Simvastatin    Review of Systems   Review of Systems  Musculoskeletal:        Right 2nd toe with redness and purulent drainage  All other systems reviewed and are negative.  Physical Exam Updated Vital Signs BP (!) 142/80 (BP Location: Right Arm)    Pulse 70    Temp (!) 97.4 F (36.3 C) (Oral)    Resp 16    SpO2 95%  Physical Exam Vitals and nursing note reviewed.  Constitutional:      Appearance: Normal appearance.  HENT:     Head: Normocephalic and atraumatic.     Right Ear: External ear normal.     Left Ear: External ear normal.     Nose: Nose normal.     Mouth/Throat:     Mouth: Mucous membranes are moist.     Pharynx: Oropharynx is clear.  Eyes:     Extraocular Movements: Extraocular movements intact.     Conjunctiva/sclera: Conjunctivae normal.     Pupils: Pupils are equal, round, and reactive to light.  Cardiovascular:  Rate and Rhythm: Normal rate and regular rhythm.     Pulses: Normal pulses.     Heart sounds: Normal heart sounds.  Pulmonary:     Effort: Pulmonary effort is normal.     Breath sounds: Normal breath sounds.  Abdominal:     General: Abdomen is flat. Bowel sounds are normal.     Palpations: Abdomen is soft.  Musculoskeletal:     Cervical back: Normal range of motion and neck supple.  Skin:    Capillary Refill: Capillary refill takes less than 2 seconds.     Comments: Cellulitis to right foot and ankle Right 2nd toe:  see pictures  Neurological:     General: No focal deficit present.     Mental Status: He is alert and oriented to person, place, and time.  Psychiatric:        Mood and Affect: Mood normal.        Behavior: Behavior normal.      ED Results / Procedures / Treatments   Labs (all labs ordered are listed, but only abnormal results are displayed) Labs Reviewed  BASIC METABOLIC PANEL - Abnormal; Notable for the following components:      Result Value   Glucose, Bld 114 (*)    All other components within normal limits  CBC WITH DIFFERENTIAL/PLATELET    EKG None  Radiology MR FOOT RIGHT W WO CONTRAST  Result Date: 05/13/2021 CLINICAL DATA:  Bone mass or bone pain. Aggressive features on x-ray. Osteomyelitis. EXAM: MRI OF THE RIGHT FOREFOOT WITHOUT AND WITH CONTRAST TECHNIQUE: Multiplanar, multisequence MR imaging of the right forefoot. Was performed before and after the administration of intravenous contrast. CONTRAST:  52mL GADAVIST GADOBUTROL 1 MMOL/ML IV SOLN COMPARISON:  Right foot radiographs 05/13/2021, 04/15/2021 . FINDINGS: Despite efforts by the technologist and patient, motion artifact is present on today's exam and could not be eliminated. This reduces exam sensitivity and specificity. Bones/Joint/Cartilage There are again severe degenerative changes of the great toe metatarsophalangeal joint including bone-on-bone contact, extensive subchondral cystic change and marrow edema, and high-grade peripheral degenerative osteophytes. a on today's radiographs there is new erosion of the distal phalanx and middle phalanx of the second toe. On MRI there is high-grade near complete erosion of the distal phalanx. There is diffuse marrow edema throughout the middle phalanx with high-grade dorsal and plantar cortical erosion diffusely (sagittal a series 8, image 19). There is also mild marrow edema within the proximal phalanx diffusely, with possible minimal cortical erosion of the distal dorsal aspect (sagittal image 19). Findings indicate acute osteomyelitis of the distal and middle phalanges and possible osteomyelitis of the proximal phalanx. Ligaments The Lisfranc ligament complex appears intact. Muscles and Tendons Mild  edema within the plantar foot musculature. Soft tissues High-grade soft tissue swelling and thickening of the second toe surrounding the osteomyelitis. Moderate dorsal midfoot soft tissue edema and swelling. IMPRESSION: 1. Osteomyelitis of the distal greater than middle phalanx of second toe. Diffuse marrow edema and possible minimal cortical erosion of the distal aspect of the proximal phalanx of the second toe possibly representing additional osteomyelitis. 2. 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 Electronically Signed   By: Yvonne Kendall M.D.   On: 05/13/2021 13:59   VAS [place] ABI WITH/WO TBI  Result Date: 05/13/2021  LOWER EXTREMITY DOPPLER STUDY Patient Name:  Miguel [name]  Date of Exam:   05/13/2021 Medical Rec #: [id number]        Accession #:    [id number] Date  of Birth: 03/01/1951         Patient Gender: M Patient Age:   42 years Exam Location:  [facility] Procedure:      VAS [place] ABI WITH/WO TBI Referring Phys: DAVID ORTIZ --------------------------------------------------------------------------------  Indications: Ulceration. High Risk Factors: Hypertension.  Limitations: Today's exam was limited due to an open wound, involuntary patient              movement and Great toe diameter. Comparison Study: No prior studies. Performing Technologist: Carlos Levering RVT  Examination Guidelines: A complete evaluation includes at minimum, Doppler waveform signals and systolic blood pressure reading at the level of bilateral brachial, anterior tibial, and posterior tibial arteries, when vessel segments are accessible. Bilateral testing is considered an integral part of a complete examination. Photoelectric Plethysmograph (PPG) waveforms and toe systolic pressure readings are included as required and additional duplex testing as needed. Limited examinations for reoccurring indications may be performed as noted.  ABI Findings: +--------+------------------+-----+---------+--------+   Right    Rt Pressure (mmHg) Index Waveform  Comment   +--------+------------------+-----+---------+--------+  Brachial 138                      triphasic           +--------+------------------+-----+---------+--------+  PTA      178                1.25  triphasic           +--------+------------------+-----+---------+--------+  DP       166                1.17  biphasic            +--------+------------------+-----+---------+--------+ +--------+------------------+-----+---------+-------+  Left     Lt Pressure (mmHg) Index Waveform  Comment  +--------+------------------+-----+---------+-------+  Brachial 142                      triphasic          +--------+------------------+-----+---------+-------+  PTA      166                1.17  triphasic          +--------+------------------+-----+---------+-------+  DP       162                1.14  triphasic          +--------+------------------+-----+---------+-------+ +-------+-----------+-----------+------------+------------+  ABI/TBI Today's ABI Today's TBI Previous ABI Previous TBI  +-------+-----------+-----------+------------+------------+  Right   1.25                                               +-------+-----------+-----------+------------+------------+  Left    1.17                                               +-------+-----------+-----------+------------+------------+  Summary: Right: Resting right ankle-brachial index is within normal range. No evidence of significant right lower extremity arterial disease. Unable to obtain TBI due to great toe diameter. Left: Resting left ankle-brachial index is within normal range. No evidence of significant left lower extremity arterial disease. Unable to obtain TBI due to great toe diameter.  *See table(s) above for measurements  and observations.  Electronically signed by Jamelle Haring on 05/13/2021 at 4:53:02 PM.    Final     Procedures Procedures    Medications Ordered in ED Medications  vancomycin (VANCOREADY)  IVPB 2000 mg/400 mL (2,000 mg Intravenous New Bag/Given 05/15/21 1333)  cefTRIAXone (ROCEPHIN) 2 g in sodium chloride 0.9 % 100 mL IVPB (0 g Intravenous Stopped 05/15/21 1331)    ED Course/ Medical Decision Making/ A&P                           Medical Decision Making  Pt's labs reviewed and are nl.  Pt restarted on vanc per pharm consult and 2 g rocephin.  Pt is willing to stay.  He knows he needs an amputation.  Pt d/w Dr. Olevia Bowens ([facility]) for admission.        Final Clinical Impression(s) / ED Diagnoses Final diagnoses:  Osteomyelitis of other site, unspecified type [facility])    Rx / [place] Orders ED Discharge Orders     None         Isla Pence, MD 05/15/21 1337

## 2021-05-15 NOTE — Progress Notes (Signed)
Pharmacy Antibiotic Note  TRINO HIGINBOTHAM is a 71 y.o. male admitted on 05/15/2021 with lower extremity wound infection - high risk for MRSA.  Pharmacy has been consulted for Vancomycin dosing.  Plan: Vancomycin 1250 mg IV q12h.  (SCr rounded to 0.8, est AUC 441) Measure Vanc levels as needed.  Goal AUC = 400 - 550. Follow up renal function, culture results, and clinical course.      Temp (24hrs), Avg:97.4 F (36.3 C), Min:97.4 F (36.3 C), Max:97.4 F (36.3 C)  Recent Labs  Lab 05/13/21 1222 05/14/21 0500 05/15/21 1224  WBC 6.7 5.5 6.8  CREATININE 0.80 0.81 0.74  LATICACIDVEN 1.4  --   --     Estimated Creatinine Clearance: 101.8 mL/min (by C-G formula based on SCr of 0.74 mg/dL).    Allergies  Allergen Reactions   Fenofibrate Other (See Comments)    Caused gallstones   Gemfibrozil Other (See Comments)    Severe muscle pain   Simvastatin Other (See Comments)    Severe muscle pain    Antimicrobials this admission: 1/14 Vancomycin >> 1/14 Ceftriaxone >>  Dose adjustments this admission:   Microbiology results:   Thank you for allowing pharmacy to be a part of this patients care.  Gretta Arab PharmD, BCPS Clinical Pharmacist [facility] main pharmacy [phone] 05/15/2021 2:34 PM

## 2021-05-15 NOTE — H&P (Signed)
History and Physical    Miguel [name]:[id number] DOB: June 01, 1950 DOA: 05/15/2021  PCP: Cari Caraway, MD   Patient coming from: Home.  I have personally briefly reviewed patient's old medical records in [place]  Chief Complaint: Second right toe wound.  HPI: Miguel [name] is a 71 y.o. male with medical history significant of hypertension, BPH, gout, hyperlipidemia, impaired fasting glucose, neuropathy, class I obesity who had a 2-week follow-up with podiatry for a left toe wound which has significantly worsened on Friday, was admitted with the purpose of having surgery yesterday (Saturday).  However, the patient signed AMA but he is returning today stating that he took care of while he needed to take care of.  He wants to have surgery now.  His wound seems to be better.  He denied having any new symptoms.  ED Course: Initial vital signs were temperature 97.4 F, pulse 70, respirations 16, BP 142/80 mmHg O2 sat 95% on room air.  The patient was restarted on vancomycin and ceftriaxone.  Lab work: CBC was normal.  BMP showed a glucose of 114 mg/dL, but he was otherwise normal.  Review of Systems: As per HPI otherwise all other systems reviewed and are negative.  Past Medical History:  Diagnosis Date   Class 1 obesity 05/13/2021    Past Surgical History:  Procedure Laterality Date   arm surgery     Bicep reattached   TRANSURETHRAL RESECTION OF PROSTATE      Social History  reports that he has never smoked. He has never used smokeless tobacco. He reports current alcohol use. He reports that he does not use drugs.  Allergies  Allergen Reactions   Fenofibrate Other (See Comments)    Caused gallstones   Gemfibrozil Other (See Comments)    Severe muscle pain   Simvastatin Other (See Comments)    Severe muscle pain    Family History  Problem Relation Age of Onset   Heart failure Mother    Prostate cancer Father    Stroke Brother    CAD Brother    CAD Brother     Prior to Admission medications   Medication Sig Start Date End Date Taking? Authorizing Provider  B Complex Vitamins (VITAMIN B COMPLEX) TABS Take 1 tablet by mouth daily.    [provider]  bisoprolol (ZEBETA) 5 MG tablet Take 5 mg by mouth daily. 02/23/21   [provider]  Cholecalciferol (VITAMIN D-3) 125 MCG (5000 UT) TABS Take 5,000 Units by mouth daily.    [provider]  colchicine 0.6 MG tablet TAKE 1 TABLET(0.6 MG) BY MOUTH DAILY FOR 21 DAYS Patient not taking: Reported on 05/13/2021 03/11/21   Lorenda Peck, MD  dutasteride (AVODART) 0.5 MG capsule Take 0.5 mg by mouth daily. 02/01/17   [provider]  gabapentin (NEURONTIN) 300 MG capsule Take 600 mg by mouth daily in the afternoon. 04/21/21   [provider]  losartan-hydrochlorothiazide (HYZAAR) 100-12.5 MG tablet Take 1 tablet by mouth daily. 03/26/17   [provider]  metFORMIN (GLUCOPHAGE-XR) 500 MG 24 hr tablet Take 500 mg by mouth at bedtime.    [provider]  naproxen (NAPROSYN) 500 MG tablet Take 500-1,000 mg by mouth daily as needed (for pain). 02/15/21   [provider]  Quercetin 500 MG CAPS Take 500 mg by mouth daily as needed (for allergies).    [provider]  rosuvastatin (CRESTOR) 20 MG tablet Take 20 mg by mouth every evening. 02/23/17  [provider]    Physical Exam: Vitals:   05/15/21 1203  BP: (!) 142/80  Pulse: 70  Resp: 16  Temp: (!) 97.4 F (36.3 C)  TempSrc: Oral  SpO2: 95%    Constitutional: NAD, calm, comfortable Eyes: PERRL, lids and conjunctivae normal ENMT: Mucous membranes are moist. Posterior pharynx clear of any exudate or lesions. Neck: normal, supple, no masses, no thyromegaly Respiratory: clear to auscultation bilaterally, no wheezing, no crackles. Normal respiratory effort. No accessory muscle use.  Cardiovascular: Regular rate and rhythm, no murmurs / rubs / gallops. No extremity  edema. 2+ pedal pulses. No carotid bruits.  Abdomen: Obese, no distention.  Soft, no tenderness, no masses palpated. No hepatosplenomegaly. Bowel sounds positive.  Musculoskeletal: no clubbing / cyanosis. No joint deformity upper and lower extremities. Good ROM, no contractures. Normal muscle tone.  Skin: Decreased edema, erythema and pus collection.  Please see pictures from today and Friday below. Neurologic: CN 2-12 grossly intact.  Decreased sensation on feet.  DTR normal. Strength 5/5 in all 4.  Psychiatric: Normal judgment and insight. Alert and oriented x 3. Normal mood.   The next 3 pictures are from today.         The next 2 pictures tired from Friday.       Labs on Admission: I have personally reviewed following labs and imaging studies  CBC: Recent Labs  Lab 05/13/21 1222 05/14/21 0500 05/15/21 1224  WBC 6.7 5.5 6.8  NEUTROABS 4.9  --  5.1  HGB 13.0 12.5* 13.0  HCT 40.6 38.3* 40.2  MCV 88.5 87.6 87.0  PLT 264 232 638    Basic Metabolic Panel: Recent Labs  Lab 05/13/21 1222 05/14/21 0500 05/15/21 1224  NA 139 140 139  K 4.2 4.0 3.7  CL 105 105 104  CO2 27 26 27   GLUCOSE 116* 118* 114*  BUN 20 18 19   CREATININE 0.80 0.81 0.74  CALCIUM 9.3 9.3 9.2    GFR: Estimated Creatinine Clearance: 101.8 mL/min (by C-G formula based on SCr of 0.74 mg/dL).  Liver Function Tests: Recent Labs  Lab 05/13/21 1222  AST 16  ALT 20  ALKPHOS 39  BILITOT 0.7  PROT 7.3  ALBUMIN 3.9    Urine analysis:    Component Value Date/Time   COLORURINE YELLOW 05/13/2021 1222   APPEARANCEUR CLEAR 05/13/2021 1222   LABSPEC 1.026 05/13/2021 1222   PHURINE 6.0 05/13/2021 1222   GLUCOSEU NEGATIVE 05/13/2021 1222   HGBUR NEGATIVE 05/13/2021 1222   BILIRUBINUR NEGATIVE 05/13/2021 1222   KETONESUR NEGATIVE 05/13/2021 1222   PROTEINUR NEGATIVE 05/13/2021 1222   NITRITE NEGATIVE 05/13/2021 1222   LEUKOCYTESUR NEGATIVE 05/13/2021 1222    Radiological Exams on  Admission: VAS [place] ABI WITH/WO TBI  Result Date: 05/13/2021  LOWER EXTREMITY DOPPLER STUDY Patient Name:  Miguel [name]  Date of Exam:   05/13/2021 Medical Rec #: [id number]        Accession #:    [id number] Date of Birth: 03-17-1951         Patient Gender: M Patient Age:   49 years Exam [facility]  [facility] Procedure:      VAS [place] ABI WITH/WO TBI Referring Phys: Maxson Oddo --------------------------------------------------------------------------------  Indications: Ulceration. High Risk Factors: Hypertension.  Limitations: Today's exam was limited due to an open wound, involuntary patient              movement and Great toe diameter. Comparison Study: No prior studies. Performing Technologist: Carlos Levering RVT  Examination Guidelines: A complete evaluation includes at minimum, Doppler waveform signals and systolic blood pressure reading at the level of bilateral brachial, anterior tibial, and posterior tibial arteries, when vessel segments are accessible. Bilateral testing is considered an integral part of a complete examination. Photoelectric Plethysmograph (PPG) waveforms and toe systolic pressure readings are included as required and additional duplex testing as needed. Limited examinations for reoccurring indications may be performed as noted.  ABI Findings: +--------+------------------+-----+---------+--------+  Right    Rt Pressure (mmHg) Index Waveform  Comment   +--------+------------------+-----+---------+--------+  Brachial 138                      triphasic           +--------+------------------+-----+---------+--------+  PTA      178                1.25  triphasic           +--------+------------------+-----+---------+--------+  DP       166                1.17  biphasic            +--------+------------------+-----+---------+--------+ +--------+------------------+-----+---------+-------+  Left     Lt Pressure (mmHg) Index Waveform  Comment   +--------+------------------+-----+---------+-------+  Brachial 142                      triphasic          +--------+------------------+-----+---------+-------+  PTA      166                1.17  triphasic          +--------+------------------+-----+---------+-------+  DP       162                1.14  triphasic          +--------+------------------+-----+---------+-------+ +-------+-----------+-----------+------------+------------+  ABI/TBI Today's ABI Today's TBI Previous ABI Previous TBI  +-------+-----------+-----------+------------+------------+  Right   1.25                                               +-------+-----------+-----------+------------+------------+  Left    1.17                                               +-------+-----------+-----------+------------+------------+  Summary: Right: Resting right ankle-brachial index is within normal range. No evidence of significant right lower extremity arterial disease. Unable to obtain TBI due to great toe diameter. Left: Resting left ankle-brachial index is within normal range. No evidence of significant left lower extremity arterial disease. Unable to obtain TBI due to great toe diameter.  *See table(s) above for measurements and observations.  Electronically signed by Jamelle Haring on 05/13/2021 at 4:53:02 PM.    Final     EKG: Independently reviewed.   Principal Problem:   Osteomyelitis of second toe of right foot ([place]) Admit to MedSurg/inpatient. Keep n.p.o. after midnight. Restart ceftriaxone 2 g daily. Restart vancomycin per pharmacy. Analgesics as needed, although not having much pain.. I spoke to Dr. Cannon Kettle who is planning for surgery tomorrow morning.   Active Problems:   Benign hypertension Continue losartan/HCTZ. Monitor BP, renal function electrolytes.  Benign prostatic hyperplasia On daily Avodart.     Gout Continue colchicine.     Hypercholesterolemia Continue rosuvastatin 20 mg p.o. daily.     Impaired fasting  glucose Continue metformin XR 500 mg p.o. at bedtime. Monitor CBG every 6 hours while NPO.     Neuropathy Continue gabapentin.     Class 1 obesity Lifestyle modifications. Follow-up with PCP.  DVT prophylaxis:      SCDs. Code Status:                         Full code. Family Communication:       Disposition Plan:              Patient is from:                        Home.             Anticipated DC to:                   Home.             Anticipated DC date:               05/15/2021.             Anticipated DC barriers:         Clinical status.           Consults called:       Awaiting podiatry on-call response. Admission status:    Inpatient/MedSurg.   Severity of Illness: High severity in the setting of osteomyelitis of the second right toe which will need amputation.   Reubin Milan MD [facility]  How to contact the [facility] Attending or Consulting provider [place] or covering provider during after hours [place], for this patient?   Check the care team in [facility] and look for a) attending/consulting [facility] provider listed and b) the [facility] team listed Log into [email or web address] and use [place] universal password to access. If you do not have the password, please contact the hospital operator. Locate the [facility] provider you are looking for under [facility] and page to a number that you can be directly reached. If you still have difficulty reaching the provider, please page the [facility] (Director on Call) for the Hospitalists listed on amion for assistance.  05/15/2021, 1:43 PM   This document was prepared using Dragon voice recognition software and may contain some unintended transcription errors.

## 2021-05-15 NOTE — ED Triage Notes (Signed)
Patient reports he was admitted for toe infection and had to leave AMA but has returned for further treatment.

## 2021-05-16 ENCOUNTER — Encounter (HOSPITAL_COMMUNITY)
Admission: EM | Disposition: A | Discharge status: Home / Self Care | Payer: Self-pay | Source: Home / Self Care | Attending: Internal Medicine

## 2021-05-16 ENCOUNTER — Inpatient Hospital Stay (HOSPITAL_COMMUNITY): Payer: Medicare (Managed Care) | Admitting: Anesthesiology

## 2021-05-16 ENCOUNTER — Encounter: Payer: Medicare (Managed Care) | Attending: Family Medicine

## 2021-05-16 ENCOUNTER — Inpatient Hospital Stay (HOSPITAL_COMMUNITY): Payer: Medicare (Managed Care)

## 2021-05-16 ENCOUNTER — Encounter (HOSPITAL_COMMUNITY): Payer: Self-pay | Admitting: Internal Medicine

## 2021-05-16 DIAGNOSIS — M86171 Other acute osteomyelitis, right ankle and foot: Secondary | ICD-10-CM | POA: Diagnosis not present

## 2021-05-16 DIAGNOSIS — R7303 Prediabetes: Secondary | ICD-10-CM | POA: Insufficient documentation

## 2021-05-16 HISTORY — PX: AMPUTATION TOE: SHX6595

## 2021-05-16 LAB — GLUCOSE, CAPILLARY
Glucose-Capillary: 111 mg/dL — ABNORMAL HIGH (ref 70–99)
Glucose-Capillary: 107 mg/dL — ABNORMAL HIGH (ref 70–99)
Glucose-Capillary: 82 mg/dL (ref 70–99)
Glucose-Capillary: 83 mg/dL (ref 70–99)
Glucose-Capillary: 123 mg/dL — ABNORMAL HIGH (ref 70–99)

## 2021-05-16 LAB — HEMOGLOBIN A1C
Hgb A1c MFr Bld: 6.2 % — ABNORMAL HIGH (ref 4.8–5.6)
Mean Plasma Glucose: 131.24 mg/dL

## 2021-05-16 LAB — SURGICAL PCR SCREEN
MRSA, PCR: NEGATIVE
Staphylococcus aureus: NEGATIVE

## 2021-05-16 IMAGING — DX DG FOOT 2V*R*
2 series · 2 of 2 positions shown · non-contrast
Comparison: [DATE]

CLINICAL DATA: Second digit amputation

EXAM:
RIGHT FOOT - 2 VIEW

[foot ap]
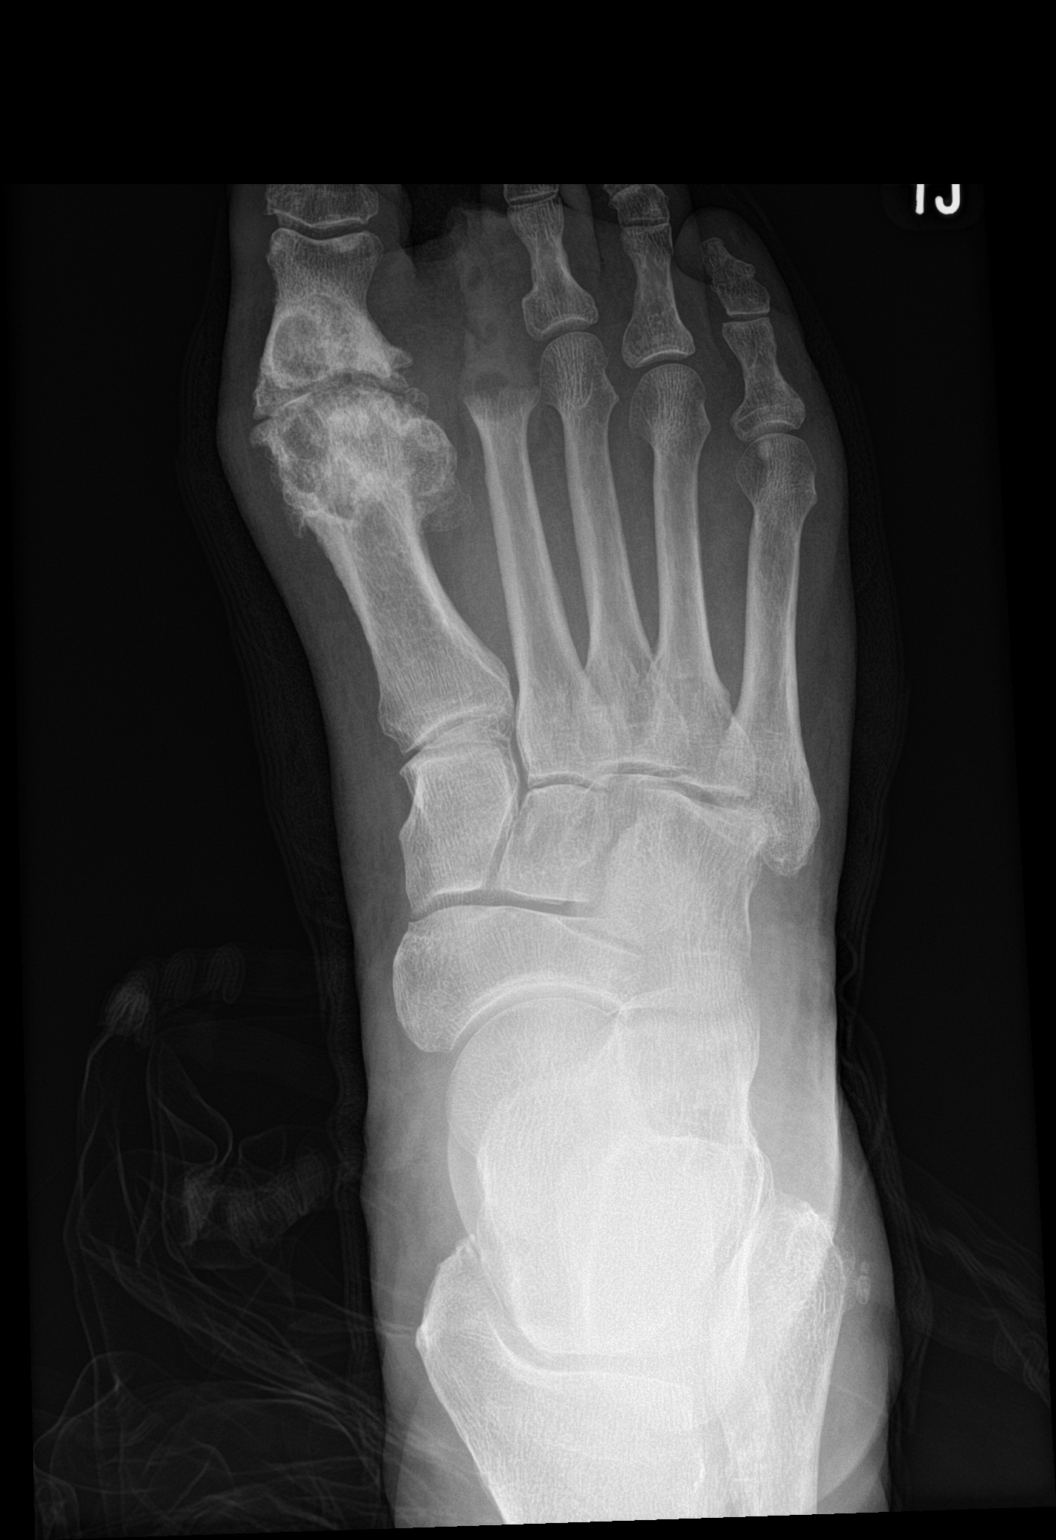

[foot lat]
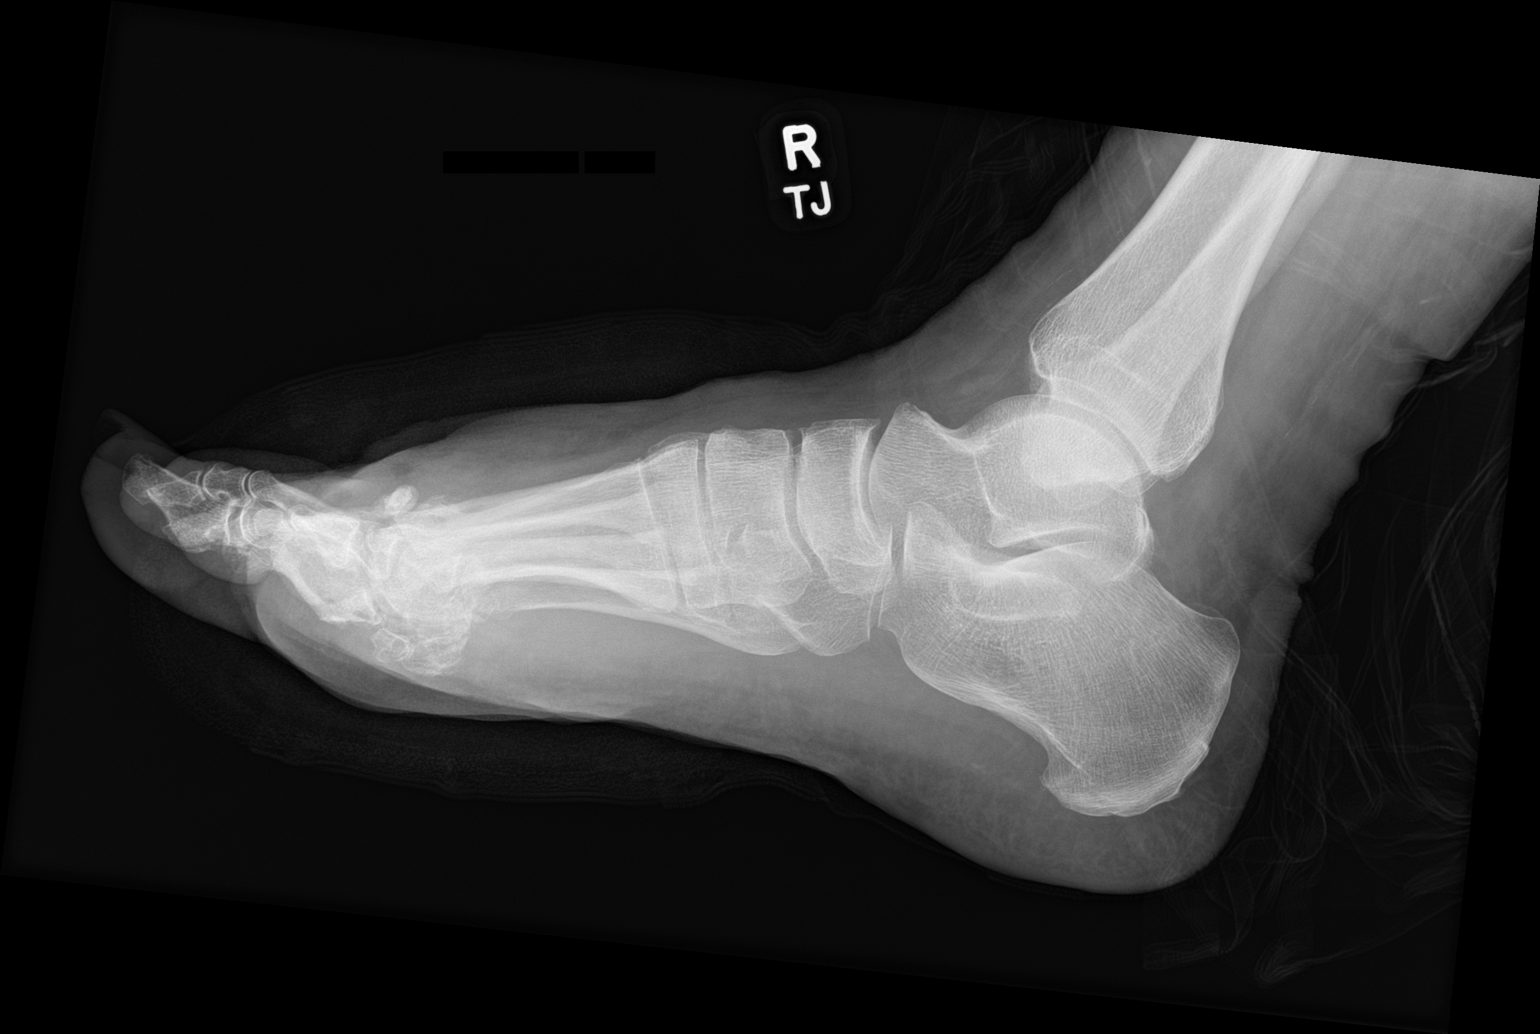

[2 of 2 positions shown; findings below may reference images not displayed]

FINDINGS: Frontal and lateral views of the right foot are obtained. Interval
amputation of the second digit at the level of the second metatarsal
head. Overlying soft tissue swelling and subcutaneous gas from
recent surgical intervention.

Severe arthropathy of the first metatarsophalangeal joint is
unchanged. No other acute or destructive bony lesions. Diffuse soft
tissue edema throughout the right foot and ankle.
IMPRESSION: 1. Postsurgical changes from second digit amputation at the level of
the second metatarsal head.
2. Stable severe osteoarthritis first metatarsophalangeal joint.

## 2021-05-16 SURGERY — AMPUTATION, TOE
Anesthesia: Monitor Anesthesia Care | Site: Toe | Laterality: Right

## 2021-05-16 MED ORDER — OXYCODONE HCL 5 MG PO TABS
5.0000 mg | ORAL_TABLET | Freq: Four times a day (QID) | ORAL | Status: DC | PRN
  Administered 2021-05-16: 5 mg via ORAL
  Administered 2021-05-17 – 2021-05-18 (×4): 10 mg via ORAL
  Filled 2021-05-16 (×2): qty 2
  Filled 2021-05-16: qty 1
  Filled 2021-05-16 (×2): qty 2

## 2021-05-16 MED ORDER — FENTANYL CITRATE (PF) 100 MCG/2ML IJ SOLN
INTRAMUSCULAR | Status: DC | PRN
  Administered 2021-05-16: 100 ug via INTRAVENOUS

## 2021-05-16 MED ORDER — LIDOCAINE HCL 1 % IJ SOLN
INTRAMUSCULAR | Status: DC | PRN
  Administered 2021-05-16 (×2): 10 mL via INTRAMUSCULAR

## 2021-05-16 MED ORDER — FENTANYL CITRATE (PF) 100 MCG/2ML IJ SOLN
INTRAMUSCULAR | Status: AC
  Filled 2021-05-16: qty 2

## 2021-05-16 MED ORDER — LACTATED RINGERS IV SOLN: INTRAVENOUS | Status: DC

## 2021-05-16 MED ORDER — FENTANYL CITRATE PF 50 MCG/ML IJ SOSY: 25.0000 ug | PREFILLED_SYRINGE | INTRAMUSCULAR | Status: DC | PRN

## 2021-05-16 MED ORDER — LIDOCAINE HCL (PF) 1 % IJ SOLN
INTRAMUSCULAR | Status: AC
  Filled 2021-05-16: qty 30

## 2021-05-16 MED ORDER — BUPIVACAINE HCL (PF) 0.5 % IJ SOLN
INTRAMUSCULAR | Status: AC
  Filled 2021-05-16: qty 30

## 2021-05-16 MED ORDER — PROPOFOL 500 MG/50ML IV EMUL
INTRAVENOUS | Status: DC | PRN
  Administered 2021-05-16: 75 ug/kg/min via INTRAVENOUS

## 2021-05-16 MED ORDER — CHLORHEXIDINE GLUCONATE CLOTH 2 % EX PADS: 6.0000 | MEDICATED_PAD | Freq: Once | CUTANEOUS | Status: DC

## 2021-05-16 MED ORDER — 0.9 % SODIUM CHLORIDE (POUR BTL) OPTIME
TOPICAL | Status: DC | PRN
  Administered 2021-05-16: 1000 mL

## 2021-05-16 SURGICAL SUPPLY — 51 items
BLADE OSC/SAG .038X5.5 CUT EDG (BLADE) ×3 IMPLANT
BLADE SURG 15 STRL LF DISP TIS (BLADE) ×4 IMPLANT
BLADE SURG 15 STRL SS (BLADE) ×4
BNDG CMPR 5X3 CHSV STRCH STRL (GAUZE/BANDAGES/DRESSINGS)
BNDG CMPR 9X4 STRL LF SNTH (GAUZE/BANDAGES/DRESSINGS) ×1
BNDG COHESIVE 3X5 TAN ST LF (GAUZE/BANDAGES/DRESSINGS) ×2 IMPLANT
BNDG COHESIVE 4X5 TAN ST LF (GAUZE/BANDAGES/DRESSINGS) ×1 IMPLANT
BNDG CONFORM 2 STRL LF (GAUZE/BANDAGES/DRESSINGS) ×2 IMPLANT
BNDG ELASTIC 3X5.8 VLCR STR LF (GAUZE/BANDAGES/DRESSINGS) ×2 IMPLANT
BNDG ELASTIC 4X5.8 VLCR STR LF (GAUZE/BANDAGES/DRESSINGS) ×1 IMPLANT
BNDG ESMARK 4X9 LF (GAUZE/BANDAGES/DRESSINGS) ×3 IMPLANT
BNDG GAUZE ELAST 4 BULKY (GAUZE/BANDAGES/DRESSINGS) ×3 IMPLANT
COVER BACK TABLE 60X90IN (DRAPES) ×2 IMPLANT
CUFF TOURN SGL QUICK 18X4 (TOURNIQUET CUFF) ×3 IMPLANT
DRAPE EXTREMITY T 121X128X90 (DISPOSABLE) ×3 IMPLANT
DRAPE IMP U-DRAPE 54X76 (DRAPES) ×3 IMPLANT
DRAPE OEC MINIVIEW 54X84 (DRAPES) ×3 IMPLANT
DRAPE SURG 17X23 STRL (DRAPES) ×3 IMPLANT
DRSG PAD ABDOMINAL 8X10 ST (GAUZE/BANDAGES/DRESSINGS) IMPLANT
DURAPREP 26ML APPLICATOR (WOUND CARE) ×3 IMPLANT
ELECT REM PT RETURN 15FT ADLT (MISCELLANEOUS) ×3 IMPLANT
GAUZE 4X4 16PLY ~~LOC~~+RFID DBL (SPONGE) IMPLANT
GAUZE SPONGE 4X4 12PLY STRL (GAUZE/BANDAGES/DRESSINGS) ×3 IMPLANT
GAUZE XEROFORM 1X8 LF (GAUZE/BANDAGES/DRESSINGS) ×3 IMPLANT
GLOVE SURG ENC MOIS LTX SZ6.5 (GLOVE) ×3 IMPLANT
GLOVE SURG UNDER POLY LF SZ6.5 (GLOVE) ×3 IMPLANT
GOWN STRL REUS W/ TWL LRG LVL3 (GOWN DISPOSABLE) ×4 IMPLANT
GOWN STRL REUS W/TWL LRG LVL3 (GOWN DISPOSABLE) ×4
KIT BASIN OR (CUSTOM PROCEDURE TRAY) ×3 IMPLANT
NDL HYPO 25X1 1.5 SAFETY (NEEDLE) ×4 IMPLANT
NDL SAFETY ECLIPSE 18X1.5 (NEEDLE) ×2 IMPLANT
NEEDLE HYPO 18GX1.5 SHARP (NEEDLE) ×2
NEEDLE HYPO 25X1 1.5 SAFETY (NEEDLE) ×4 IMPLANT
NS IRRIG 1000ML POUR BTL (IV SOLUTION) IMPLANT
PADDING CAST ABS 4INX4YD NS (CAST SUPPLIES)
PADDING CAST ABS COTTON 4X4 ST (CAST SUPPLIES) ×2 IMPLANT
PENCIL SMOKE EVACUATOR (MISCELLANEOUS) ×2 IMPLANT
SLEEVE SCD COMPRESS KNEE MED (STOCKING) ×3 IMPLANT
STOCKINETTE 4X48 STRL (DRAPES) ×2 IMPLANT
STOCKINETTE 6  STRL (DRAPES) ×2
STOCKINETTE 6 STRL (DRAPES) ×2 IMPLANT
SUT ETHILON 3 0 PS 1 (SUTURE) ×3 IMPLANT
SUT ETHILON 4 0 PS 2 18 (SUTURE) IMPLANT
SUT PROLENE 0 CT 2 (SUTURE) ×1 IMPLANT
SUT VIC AB 2-0 CT2 27 (SUTURE) ×1 IMPLANT
SUT VIC AB 3-0 FS2 27 (SUTURE) IMPLANT
SYR 10ML LL (SYRINGE) ×7 IMPLANT
SYR BULB EAR ULCER 3OZ GRN STR (SYRINGE) ×2 IMPLANT
SYR CONTROL 10ML LL (SYRINGE) ×1 IMPLANT
TOWEL OR 17X26 10 PK STRL BLUE (TOWEL DISPOSABLE) ×3 IMPLANT
UNDERPAD 30X36 HEAVY ABSORB (UNDERPADS AND DIAPERS) ×3 IMPLANT

## 2021-05-16 NOTE — Consult Note (Signed)
Podiatry Consult Note  To: Dr. Posey Pronto, hospitalist  Reason for consult: Right 2nd toe osteomyelitis  From: Dr. Cannon Kettle, Podiatry   HPI: Miguel [name] is a 71 y.o. male patient who seen at bedside for evaluation of right second toe osteomyelitis.  Patient was here on 1/14 and left AMA. Reports that he thought about what I said when he was here a few days ago and decided to come back to the hospital. No constitutional symptoms at this time. Confirms NPO since midnight.   Patient Active Problem List   Diagnosis Date Noted   Osteomyelitis of second toe of right foot ([place]) 05/13/2021   Class 1 obesity 05/13/2021   Benign hypertension 03/10/2021   Benign prostatic hyperplasia 03/10/2021   Bilateral primary osteoarthritis of knee 03/10/2021   ED (erectile dysfunction) of organic origin 03/10/2021   Gout 03/10/2021   Hypercholesterolemia 03/10/2021   Impaired fasting glucose 03/10/2021   Lesion of liver 03/10/2021   Neuropathy 03/10/2021   Morbid obesity ([place]) 03/10/2021   Basal cell carcinoma (BCC) of back 07/31/2019    No current facility-administered medications on file prior to encounter.   Current Outpatient Medications on File Prior to Encounter  Medication Sig Dispense Refill   B Complex Vitamins (VITAMIN B COMPLEX) TABS Take 1 tablet by mouth daily.     bisoprolol (ZEBETA) 5 MG tablet Take 5 mg by mouth daily.     Cholecalciferol (VITAMIN D-3) 125 MCG (5000 UT) TABS Take 5,000 Units by mouth daily.     dutasteride (AVODART) 0.5 MG capsule Take 0.5 mg by mouth daily.     gabapentin (NEURONTIN) 300 MG capsule Take 600 mg by mouth daily in the afternoon.     losartan-hydrochlorothiazide (HYZAAR) 100-12.5 MG tablet Take 1 tablet by mouth daily.     metFORMIN (GLUCOPHAGE-XR) 500 MG 24 hr tablet Take 500 mg by mouth at bedtime.     naproxen (NAPROSYN) 500 MG tablet Take 500-1,000 mg by mouth daily as needed for moderate pain.     Quercetin 500 MG CAPS Take 500 mg by mouth daily as  needed (for allergies).     rosuvastatin (CRESTOR) 20 MG tablet Take 20 mg by mouth every evening.     Ubiquinol 100 MG CAPS Take 100 capsules by mouth 2 (two) times daily.     colchicine 0.6 MG tablet TAKE 1 TABLET(0.6 MG) BY MOUTH DAILY FOR 21 DAYS (Patient not taking: Reported on 05/13/2021) 90 tablet 2    Allergies  Allergen Reactions   Fenofibrate Other (See Comments)    Caused gallstones   Gemfibrozil Other (See Comments)    Severe muscle pain   Simvastatin Other (See Comments)    Severe muscle pain    Past Surgical History:  Procedure Laterality Date   arm surgery     Bicep reattached   TRANSURETHRAL RESECTION OF PROSTATE      Family History  Problem Relation Age of Onset   Heart failure Mother    Prostate cancer Father    Stroke Brother    CAD Brother    CAD Brother     Social History   Socioeconomic History   Marital status: Single    Spouse name: Not on file   Number of children: Not on file   Years of education: Not on file   Highest education level: Not on file  Occupational History   Not on file  Tobacco Use   Smoking status: Never   Smokeless tobacco: Never  Vaping Use  Vaping Use: Never used  Substance and Sexual Activity   Alcohol use: Yes   Drug use: Never   Sexual activity: Not on file  Other Topics Concern   Not on file  Social History Narrative   Not on file   Social Determinants of Health   Financial Resource Strain: Not on file  Food Insecurity: Not on file  Transportation Needs: Not on file  Physical Activity: Not on file  Stress: Not on file  Social Connections: Not on file  Intimate Partner Violence: Not on file     Objective:  Today's Vitals   05/15/21 2200 05/16/21 0137 05/16/21 0440 05/16/21 0900  BP: 131/74 120/64 130/85   Pulse: 71 (!) 59 (!) 57   Resp: 20  19   Temp: 98.6 F (37 C) 98.4 F (36.9 C) 98.3 F (36.8 C)   TempSrc: Oral Oral Oral   SpO2: 91% (!) 89% 94%   Weight:      Height:      PainSc:     0-No pain   Body mass index is 32.28 kg/m.   General: Alert and oriented x3 in no acute distress  Dermatology: Significant swelling sausage appearance and erythema and multiple open wounds to the right second toe no current active drainage from the distal tuft of the right second toe there is cellulitis extending to the level of the forefoot at MTPJ, focal warmth, and reduced odor. See picture of toe in media section of chart from yesterday   Vascular: Dorsalis Pedis and Posterior Tibial pedal pulses are difficult to palpate due to soft tissue swelling, CFT intact to all digits excluding 2nd toe on right.   Neurology: Gross sensation intact via light touch bilateral.  Musculoskeletal: Minimal tenderness to palpation of the right foot.  Results MRI right foot, 05/13/21 IMPRESSION: 1. Osteomyelitis of the distal greater than middle phalanx of second toe. Diffuse marrow edema and possible minimal cortical erosion of the distal aspect of the proximal phalanx of the second toe possibly representing additional osteomyelitis. 2. 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    X-ray right foot, 05/13/21 IMPRESSION: 1. Cortical erosion concerning for osteomyelitis of the distal greater than middle phalanges of the second toe. High-grade soft tissue swelling. 2. Severe great toe metatarsophalangeal joint osteoarthritis, unchanged from prior    Results for orders placed or performed during the hospital encounter of 05/15/21  Surgical pcr screen     Status: None   Collection Time: 05/16/21  7:34 AM   Specimen: Nasal Mucosa; Nasal Swab  Result Value Ref Range Status   MRSA, PCR NEGATIVE NEGATIVE Final   Staphylococcus aureus NEGATIVE NEGATIVE Final    Comment: (NOTE) The Xpert SA Assay (FDA approved for NASAL specimens in patients 88 years of age and older), is one component of a comprehensive surveillance program. It is not intended to diagnose infection nor  to guide or monitor treatment. Performed at [facility], [place] [address], [place], [place] [address]      Assessment and Plan: Problem List Items Addressed This Visit   None Visit Diagnoses     Osteomyelitis of other site, unspecified type [facility])    -  Primary   Relevant Medications   cefTRIAXone (ROCEPHIN) 2 g in sodium chloride 0.9 % 100 mL IVPB (Completed)   vancomycin (VANCOREADY) IVPB 2000 mg/400 mL (Completed)   cefTRIAXone (ROCEPHIN) 2 g in sodium chloride 0.9 % 100 mL IVPB (Start on 05/16/2021 12:00 PM)   vancomycin (  VANCOREADY) IVPB 1250 mg/250 mL        -Complete examination performed -Xrays and MRI reviewed again  -Re-Discussed treatment options for management of gout and osteomyelitis of right second toe.  Patient is now agreeable to surgery. Plan for OR today for right 2nd toe amputation with removal of all infected bone and soft tissue. NPO status confirmed. All questions answered. Risks, alternatives, benefits explained, No guarantees given or implied. Consent to be obtained.   -Recommend continue with broad-spectrum antibiotics -Recommend rest and elevation for pain and edema control -Consult appreciated -Podiatry to follow  Dr. Landis Martins, [place] and [place] [phone] office [phone] cell  Time spent with patient for exam and coordination of care: 25   mins

## 2021-05-16 NOTE — Transfer of Care (Signed)
Immediate Anesthesia Transfer of Care Note  Patient: Miguel [name]  Procedure(s) Performed: Procedure(s) with comments: AMPUTATION TOE (Right) - 2nd  Patient Location: PACU  Anesthesia Type:MAC  Level of Consciousness: awake, alert  and oriented  Airway & Oxygen Therapy: Patient Spontanous Breathing and Patient connected to nasal cannula oxygen  Post-op Assessment: Report given to RN and Post -op Vital signs reviewed and stable  Post vital signs: Reviewed and stable  Last Vitals:  Vitals:   05/16/21 0440 05/16/21 1315  BP: 130/85 138/81  Pulse: (!) 57 (!) 56  Resp: 19 16  Temp: 36.8 C 36.7 C  SpO2: 02% 72%    Complications: No apparent anesthesia complications

## 2021-05-16 NOTE — Progress Notes (Signed)
Orthopedic Tech Progress Note Patient Details:  Miguel [name] 06/18/1950 [id number]  Ortho Devices Type of Ortho Device: Postop shoe/boot Ortho Device/Splint Interventions: Ordered   Post Interventions Instructions Provided: Care of device, Adjustment of device Dropped off in pt's room.  Vernona Rieger 05/16/2021, 8:34 PM

## 2021-05-16 NOTE — Progress Notes (Signed)
°  Progress Note   Patient: Miguel [name]:[id number] DOB: December 25, 1950 DOA: 05/15/2021     1 DOS: the patient was seen and examined on 05/16/2021   Brief hospital course: No notes on file  Assessment and Plan   Osteomyelitis of second toe of right foot (HCC) Kept n.p.o. after midnight. Treated with ceftriaxone 2 g daily and vancomycin per pharmacy. Analgesics as needed. Podiatry was consulted, underwent amputation. Will continue antibiotics.     Benign hypertension Continue losartan/HCTZ. Monitor BP, renal function electrolytes.     Benign prostatic hyperplasia Continue Avodart.     Gout On colchicine.     Hypercholesterolemia Continue rosuvastatin 20 mg p.o. daily.     Impaired fasting glucose Placed on sliding scale insulin.     Neuropathy Continue gabapentin.     Class 1 obesity Lifestyle modifications. Follow-up with PCP.   Subjective: Pain well controlled.  Redness improved.  No nausea no vomiting.  Objective Vitals:   05/16/21 1650 05/16/21 1700 05/16/21 1737 05/16/21 1838  BP: 109/73 116/64 (!) 161/85 (!) 154/80  Pulse: 63 62  84  Resp: 13 16 18 16   Temp: 97.6 F (36.4 C)  98.7 F (37.1 C) 98 F (36.7 C)  TempSrc:   Oral Oral  SpO2: 97% 99% 100% 100%  Weight:      Height:        General: Appear in mild distress, no Rash; Oral Mucosa Clear, moist. no Abnormal Neck Mass Or lumps, Conjunctiva normal  Cardiovascular: S1 and S2 Present, no Murmur, Respiratory: good respiratory effort, Bilateral Air entry present and CTA, no Crackles, no wheezes Abdomen: Bowel Sound present, Soft and no tenderness Extremities: Right lower pedal edema Neurology: alert and oriented to time, place, and person affect appropriate. no new focal deficit Gait not checked due to patient safety concerns   Data Reviewed:  My review of labs, imaging, notes and other tests shows no new significant findings.   Family Communication: None at bedside  Disposition: Status is:  Inpatient  Remains inpatient appropriate because: Undergoing amputation.  Will require clear margin as well as clear cultures before discharge.  Currently on IV antibiotics.         Author: Berle Mull, MD 05/16/2021 7:12 PM  For on call review [email or web address].

## 2021-05-16 NOTE — Anesthesia Postprocedure Evaluation (Signed)
Anesthesia Post Note  Patient: Miguel [name]  Procedure(s) Performed: AMPUTATION TOE (Right: Toe)     Patient location during evaluation: PACU Anesthesia Type: MAC Level of consciousness: awake and alert Pain management: pain level controlled Vital Signs Assessment: post-procedure vital signs reviewed and stable Respiratory status: spontaneous breathing, nonlabored ventilation and respiratory function stable Cardiovascular status: stable and blood pressure returned to baseline Postop Assessment: no apparent nausea or vomiting Anesthetic complications: no   No notable events documented.  Last Vitals:  Vitals:   05/16/21 1700 05/16/21 1737  BP: 116/64 (!) 161/85  Pulse: 62   Resp: 16 18  Temp:  37.1 C  SpO2: 99% 100%    Last Pain:  Vitals:   05/16/21 1737  TempSrc: Oral  PainSc:                  [name],[name]

## 2021-05-16 NOTE — Anesthesia Preprocedure Evaluation (Addendum)
Anesthesia Evaluation  Patient identified by MRN, date of birth, ID band Patient awake    Reviewed: Allergy & Precautions, H&P , NPO status , Patient's Chart, lab work & pertinent test results, reviewed documented beta blocker date and time   Airway Mallampati: II  TM Distance: >3 FB Neck ROM: Full    Dental no notable dental hx. (+) Teeth Intact, Dental Advisory Given   Pulmonary neg pulmonary ROS,    Pulmonary exam normal breath sounds clear to auscultation       Cardiovascular hypertension, Pt. on medications and Pt. on home beta blockers  Rhythm:Regular Rate:Normal     Neuro/Psych negative neurological ROS  negative psych ROS   GI/Hepatic negative GI ROS, Neg liver ROS,   Endo/Other  negative endocrine ROS  Renal/GU negative Renal ROS  negative genitourinary   Musculoskeletal  (+) Arthritis , Osteoarthritis,    Abdominal   Peds  Hematology negative hematology ROS (+)   Anesthesia Other Findings   Reproductive/Obstetrics negative OB ROS                            Anesthesia Physical Anesthesia Plan  ASA: 2  Anesthesia Plan: MAC   Post-op Pain Management: Minimal or no pain anticipated   Induction: Intravenous  PONV Risk Score and Plan: 2 and Propofol infusion and Ondansetron  Airway Management Planned: Natural Airway and Simple Face Mask  Additional Equipment:   Intra-op Plan:   Post-operative Plan:   Informed Consent: I have reviewed the patients History and Physical, chart, labs and discussed the procedure including the risks, benefits and alternatives for the proposed anesthesia with the patient or authorized representative who has indicated his/her understanding and acceptance.     Dental advisory given  Plan Discussed with: CRNA  Anesthesia Plan Comments:         Anesthesia Quick Evaluation

## 2021-05-16 NOTE — Op Note (Signed)
DATE: 05-16-21  SURGEON: Landis Martins, DPM  PREOPERATIVE DIAGNOSIS:  Right 2nd toe osteomyelitis   POSTOPERATIVE DIAGNOSIS: Same  PROCEDURE PERFORMED: Right 2nd and distal metatarsal amputation   HEMOSTASIS:  right ankle tourniquet  ESTIMATED BLOOD LOSS:  10 cc  ANESTHESIA:  MAC with local 20cc of 1:1 mixture 1% lidocaine and 0.5% marcaine plain  SPECIMENS: Bone and soft tissue- path, wound swab- micro  COMPLICATIONS:  None.  INDICATIONS FOR PROCEDURE:  This patient is a pleasant 71 y.o. male who has been seen in office for infected toe with history of osteomyelitis that started to develop after having a wound on toe. Patient has history of gout. Risks and complications include but are not limited to infection, recurrence of symptoms, pain, numbness, wound dehiscence, delayed healing, as well as need for future surgery/further amputation.  No guarantees were given or applied.  All questions were answered to the patients satisfaction, and the patient has consented to the above procedure.  All preoperative labs and H&P, medical clearances have been obtained and NPO status past midnight has been confirmed.   PREPARATION FOR PROCEDURE:  The patient was brought to the operating room and placed on the operating table in supine position.  A pneumatic ankle tourniquet was placed about the patient's right foot but not yet inflated.  After the department of anesthesia had administered MAC anesthesia. A local block was administered and the right foot was then scrubbed, prepped, and draped in the usual aseptic manner.  An Esmarch bandage was utilized to exsanguinate the patients right foot and leg, and the pneumatic tourniquet was inflated to 250 mmHg.  PROCEDURE IN DETAIL:  Attention was then directed to the dorsomedial aspect of the patient's right foot where a racquet-type incision was made, extending distally, looping around the  infected 2nd toe into the digital web space. The incision was made  in the routine fashion, single layer down to the level of bone. When doing so, there was no purulence noted at the incision site. The incision was carried deep down to the level of the 2nd metatarsophalangeal joint where the toe was disarticulated at that level. Once disarticulated, the 2nd  metatarsophalangeal joint, the toe was removed from the operative field and placed on the back stable to be sent for pathologic analysis. The 2nd metatarsal head was inspected and resected at level of surgical neck. Any remaining devitalizing or infected tissue was debrided as necessary. The extensor tendon was dissected as proximally as visible and transected at that level. The operative site was then irrigated with normal sterile saline. A small tissue culture from the remaining tissue flaps was then taken post irrigation, to be sent for culture and sensitivity analysis. Also, bleeders were bovied as necessary. Skin closure was then obtained utilizing 2-0 vicryl and 3-0 nylon in combination of simple interrupted suture fashion and 0-prolene in a mattress suture fashion. A post op block was administered.   The right foot was then dressed with betadine, 4 x 4 gauze, Kerlix,coban and ACE.  At this time, the right pneumatic tourniquet was deflated, and a positive hyperemic response was noted to all remaining digits. The patient tolerated the procedure and anesthesia well.  Upon transfer to the recovery room, the patients vital signs were stable, and neurovascular status was intact.    Plan of care: Return to medical floor. Continue with antibiotics. Await prelimb results and discharge to home possibly on oral for 2 weeks for soft tissue coverage. May weightbear with post op shoe or  CAM boot as ordered. Patient will get f/u appt after discharge back in office with Dr. Blenda Mounts. Keep dressings clean and dry; we will change dressings in office at follow up visit and inpatient as needed.   Landis Martins, DPM

## 2021-05-16 NOTE — Brief Op Note (Signed)
05/16/2021  4:50 PM  PATIENT:  Unice Bailey  71 y.o. male  PRE-OPERATIVE DIAGNOSIS:  Osteomyelitis  POST-OPERATIVE DIAGNOSIS:  Osteomyelitis  PROCEDURE:  Procedure(s) with comments: AMPUTATION TOE (Right) - 2nd  SURGEON:  Surgeon(s) and Role:     Landis Martins, DPM - Primary  PHYSICIAN ASSISTANT:   ASSISTANTS: none   ANESTHESIA:   MAC  EBL:  10 mL   BLOOD ADMINISTERED:none  DRAINS: none   LOCAL MEDICATIONS USED:  MARCAINE    and LIDOCAINE   SPECIMEN:  Source of Specimen:  2nd toe and metatarsal to pathology and deep wound culture swab to micro  DISPOSITION OF SPECIMEN:  PATHOLOGY  COUNTS:  YES  TOURNIQUET:   Total Tourniquet Time Documented: Ankle (Right) - 28 minutes Total: Ankle (Right) - 28 minutes   DICTATION: .Note written in EPIC  PLAN OF CARE: Admit to inpatient   PATIENT DISPOSITION:  PACU - hemodynamically stable.   Delay start of Pharmacological VTE agent (>24hrs) due to surgical blood loss or risk of bleeding: no

## 2021-05-17 ENCOUNTER — Encounter (HOSPITAL_COMMUNITY): Payer: Self-pay | Admitting: Sports Medicine

## 2021-05-17 LAB — GLUCOSE, CAPILLARY
Glucose-Capillary: 92 mg/dL (ref 70–99)
Glucose-Capillary: 104 mg/dL — ABNORMAL HIGH (ref 70–99)
Glucose-Capillary: 107 mg/dL — ABNORMAL HIGH (ref 70–99)
Glucose-Capillary: 128 mg/dL — ABNORMAL HIGH (ref 70–99)

## 2021-05-17 NOTE — Progress Notes (Signed)
°  Subjective:  Patient ID: Miguel [name], male    DOB: [birth year],  MRN: [id number]  POD# 1 right 2nd toe amputation  Overall doing well, had some bleeding last night after surgery and dressing had to be changed   Negative for chest pain and shortness of breath Fever: no Night sweats: no Objective:   Vitals:   05/17/21 0640 05/17/21 0946  BP: 129/82 (!) 110/54  Pulse: 66 66  Resp: 18 16  Temp: 98.6 F (37 C) 99.3 F (37.4 C)  SpO2: 95% 95%   General AA&O x3. Normal mood and affect.  Vascular Dorsalis pedis and posterior tibial pulses 2/4 bilat. Brisk capillary refill to all digits. Pedal hair present.  Neurologic Epicritic sensation grossly reduced.  Dermatologic Dressings c/d/I, incision well coapted no cellulitis or SOI, no active bleeding  Orthopedic: MMT 5/5 in dorsiflexion, plantarflexion, inversion, and eversion. Normal joint ROM without pain or crepitus.   Results for orders placed or performed during the hospital encounter of 05/15/21  Surgical pcr screen     Status: None   Collection Time: 05/16/21  7:34 AM   Specimen: Nasal Mucosa; Nasal Swab  Result Value Ref Range Status   MRSA, PCR NEGATIVE NEGATIVE Final   Staphylococcus aureus NEGATIVE NEGATIVE Final    Comment: (NOTE) The Xpert SA Assay (FDA approved for NASAL specimens in patients 74 years of age and older), is one component of a comprehensive surveillance program. It is not intended to diagnose infection nor to guide or monitor treatment. Performed at [facility], [place] [address] [place], [place] [address]   Aerobic/Anaerobic Culture w Gram Stain (surgical/deep wound)     Status: None (Preliminary result)   Collection Time: 05/16/21  3:09 PM   Specimen: PATH Soft tissue  Result Value Ref Range Status   Specimen Description   Final    TISSUE Performed at [place] [address], [place], [place] [address]    Special Requests   Final    NONE Performed  at [facility], [place] [address], [place], [place] [address]    Gram Stain   Final    NO SQUAMOUS EPITHELIAL CELLS SEEN FEW WBC SEEN NO ORGANISMS SEEN    Culture   Final    NO GROWTH < 24 HOURS Performed at [facility], [place] [address] [place],  [address]    Report Status PENDING  Incomplete     Assessment & Plan:  Patient was evaluated and treated and all questions answered.  S/p 2nd toe amp -WBAT to RLE in surg shoe -No dressing changes required at home -OK to d/c from our standpoint. If no speciation on culture by tomorrow would do 2 weeks doxycycline 100mg  bid and we will follow as outpatient -Will have f/u scheduled at our [facility] location next week   Criselda Peaches, DPM  Accessible via secure chat for questions or concerns.

## 2021-05-17 NOTE — Assessment & Plan Note (Signed)
On Crestor.  Continue.

## 2021-05-17 NOTE — Hospital Course (Signed)
Past medical history of HTN, BPH, HLD, neuropathy, obesity presented with complaints of worsening right second toe infection. Recently seen in the ER and left AMA. 1/16 underwent amputation of right second toe. 1/17 podiatry recommends WBAT to RLE in surg shoe -No dressing changes required at home -OK to d/c once cultures are negative on 1/18. Recommend 2 weeks doxycycline 100mg  bid.  Podiatry will follow-up in 1 week

## 2021-05-17 NOTE — Assessment & Plan Note (Signed)
SP right second and distal metatarsal amputation by podiatry on 1/16. Started on broad-spectrum IV antibiotics were Cultures so far negative. Per podiatry patient can be discharged on 1/18 on oral doxycycline for 2 weeks with outpatient follow-up as long as cultures remain negative.

## 2021-05-17 NOTE — Assessment & Plan Note (Signed)
Body mass index is 32.27 kg/m.  Placing the patient at high risk of poor outcome.

## 2021-05-17 NOTE — Assessment & Plan Note (Signed)
Currently on sliding-scale insulin.

## 2021-05-17 NOTE — Progress Notes (Signed)
°  Progress Note   Patient: Miguel [name] SVX:[id number] DOB: Jan 16, 1951 DOA: 05/15/2021     2 DOS: the patient was seen and examined on 05/17/2021   Brief hospital course: Past medical history of HTN, BPH, HLD, neuropathy, obesity presented with complaints of worsening right second toe infection. Recently seen in the ER and left AMA. 1/16 underwent amputation of right second toe. 1/17 podiatry recommends WBAT to RLE in surg shoe -No dressing changes required at home -OK to d/c once cultures are negative on 1/18. Recommend 2 weeks doxycycline 100mg  bid.  Podiatry will follow-up in 1 week  Assessment and Plan * Osteomyelitis of second toe of right foot ([place])- (present on admission) SP right second and distal metatarsal amputation by podiatry on 1/16. Started on broad-spectrum IV antibiotics were Cultures so far negative. Per podiatry patient can be discharged on 1/18 on oral doxycycline for 2 weeks with outpatient follow-up as long as cultures remain negative.  Benign hypertension- (present on admission) Blood pressure stable.  Monitor.  Continue losartan.  Hypercholesterolemia- (present on admission) On Crestor.  Continue.  Impaired fasting glucose- (present on admission) Currently on sliding-scale insulin.  Class 1 obesity- (present on admission) Body mass index is 32.27 kg/m.  Placing the patient at high risk of poor outcome.     Subjective: Pain well controlled.  No nausea no vomiting but no fever no chills.  Objective Vitals:   05/16/21 2027 05/17/21 0156 05/17/21 0640 05/17/21 0946  BP: 122/73 (!) 128/95 129/82 (!) 110/54  Pulse: 65 (!) 57 66 66  Resp: 18 18 18 16   Temp: (!) 97.4 F (36.3 C) 98.4 F (36.9 C) 98.6 F (37 C) 99.3 F (37.4 C)  TempSrc: Oral Oral Oral Oral  SpO2: 94% 92% 95% 95%  Weight:      Height:        General: Appear in mild distress, no Rash; Oral Mucosa Clear, moist. no Abnormal Neck Mass Or lumps, Conjunctiva normal  Cardiovascular: S1 and  S2 Present, no Murmur, Respiratory: good respiratory effort, Bilateral Air entry present and CTA, no Crackles, no wheezes Abdomen: Bowel Sound present, Soft and no tenderness Extremities: Right leg warmth and pedal edema Neurology: alert and oriented to time, place, and person affect appropriate. no new focal deficit Gait not checked due to patient safety concerns   Data Reviewed:  CBG stable.  Wound culture currently growing no bacteria.  I have ordered CBC and BMP for tomorrow.  Reviewed podiatry's note.  Family Communication: None at bedside.  Disposition: Status is: Inpatient  Remains inpatient appropriate because: Ongoing need for IV antibiotics.     Time spent: 35 minutes  Author: Berle Mull, MD 05/17/2021 7:15 PM  For on call review [email or web address].

## 2021-05-17 NOTE — Assessment & Plan Note (Signed)
Blood pressure stable.  Monitor.  Continue losartan.

## 2021-05-17 NOTE — Progress Notes (Signed)
Transition of Care [facility]) Screening Note  Patient Details  Name: Miguel [name] Date of Birth: 05-16-1950  [facility]) CM/SW Contact:    Sherie Don, LCSW Phone Number: 05/17/2021, 11:16 AM  Transition of Care Department [facility]) has reviewed patient and no TOC needs have been identified at this time. We will continue to monitor patient advancement through interdisciplinary progression rounds. If new patient transition needs arise, please place a TOC consult.

## 2021-05-17 NOTE — Evaluation (Signed)
Physical Therapy Evaluation Patient Details Name: Miguel [name] MRN: [id number] DOB: August 19, 1950 Today's Date: 05/17/2021  History of Present Illness  71 yo male admitted with osteomyelitis. S/P R 2nd toe and distal met amputation 05/16/21. Hx of gout, obesity, neuropathy  Clinical Impression  On eval, pt was Min guard-Min A with  mobility. He is unsteady when ambulating hallways. Intermittent assist required from therapist to prevent LOB. Pt reported pain as 4/10 at rest and 6/10 with activity. Utilized post op shoe during session. Unfortunately, shoe does not over a good fit (pt's foot tends to slide to front of shoe which cause straps to bunch up). Encouraged pt to discuss shoe with podiatrist if they f/u on tomorrow to see if they may order a different shoe that will possibly fit better. Pt did state that he has a post op shoe that he purchased from a medical supply for his L foot that fit fairly well. He stated he may decide to purchase another for his R foot. Will plan to follow pt during this hospital stay. He politely declines any assistive devices. He stated he feels he can manage at home alone just fine.      Recommendations for follow up therapy are one component of a multi-disciplinary discharge planning process, led by the attending physician.  Recommendations may be updated based on patient status, additional functional criteria and insurance authorization.  Follow Up Recommendations No PT follow up    Assistance Recommended at Discharge None  Patient can return home with the following  A little help with walking and/or transfers    Equipment Recommendations None recommended by PT  Recommendations for Other Services       Functional Status Assessment Patient has had a recent decline in their functional status and demonstrates the ability to make significant improvements in function in a reasonable and predictable amount of time.     Precautions / Restrictions  Precautions Precautions: Fall Required Braces or Orthoses: Other Brace Other Brace: post op shoe Restrictions Weight Bearing Restrictions: Yes Other Position/Activity Restrictions: WBAT with post op shoe      Mobility  Bed Mobility Overal bed mobility: Modified Independent                  Transfers Overall transfer level: Modified independent                      Ambulation/Gait Ambulation/Gait assistance: Min assist, Min guard Gait Distance (Feet): 225 Feet Assistive device: None Gait Pattern/deviations: Step-through pattern, Decreased stride length       General Gait Details: Intermittent unsteadiness/imbalance requiring assist from therapist to prevent LOB. Cues for pt to slow pace a little. Pain worsened slightly (from 4/10 to 6/10) with ambulation  Stairs            Wheelchair Mobility    Modified Rankin (Stroke Patients Only)       Balance Overall balance assessment: Needs assistance         Standing balance support: No upper extremity supported, During functional activity Standing balance-Leahy Scale: Fair                               Pertinent Vitals/Pain Pain Assessment Pain Assessment: 0-10 Pain Score: 5  Pain Location: R foot Pain Descriptors / Indicators: Discomfort, Sore Pain Intervention(s): Limited activity within patient's tolerance, Monitored during session    Home Living Family/patient expects to be discharged  to:: Private residence Living Arrangements: Alone   Type of Home: House Home Access: Stairs to enter Entrance Stairs-Rails: Right Entrance Stairs-Number of Steps: 6   Home Layout: One level Home Equipment: None      Prior Function Prior Level of Function : Independent/Modified Independent                     Hand Dominance        Extremity/Trunk Assessment   Upper Extremity Assessment Upper Extremity Assessment: Overall WFL for tasks assessed    Lower Extremity  Assessment Lower Extremity Assessment: Generalized weakness (hx of neuropathy)       Communication   Communication: No difficulties  Cognition Arousal/Alertness: Awake/alert Behavior During Therapy: WFL for tasks assessed/performed Overall Cognitive Status: Within Functional Limits for tasks assessed                                          General Comments      Exercises     Assessment/Plan    PT Assessment Patient needs continued PT services  PT Problem List Decreased mobility;Decreased activity tolerance;Decreased balance;Pain       PT Treatment Interventions Gait training;Therapeutic activities;Therapeutic exercise;Patient/family education;Balance training;Functional mobility training    PT Goals (Current goals can be found in the Care Plan section)  Acute Rehab PT Goals Patient Stated Goal: home soon PT Goal Formulation: With patient Time For Goal Achievement: 05/31/21 Potential to Achieve Goals: Good    Frequency Min 3X/week     Co-evaluation               AM-PAC PT "6 Clicks" Mobility  Outcome Measure Help needed turning from your back to your side while in a flat bed without using bedrails?: None Help needed moving from lying on your back to sitting on the side of a flat bed without using bedrails?: None Help needed moving to and from a bed to a chair (including a wheelchair)?: None Help needed standing up from a chair using your arms (e.g., wheelchair or bedside chair)?: None Help needed to walk in hospital room?: A Little Help needed climbing 3-5 steps with a railing? : A Little 6 Click Score: 22    End of Session   Activity Tolerance: Patient tolerated treatment well Patient left: in bed;with call bell/phone within reach   PT Visit Diagnosis: Unsteadiness on feet (R26.81);Difficulty in walking, not elsewhere classified (R26.2)    Time: 3382-5053 PT Time Calculation (min) (ACUTE ONLY): 12 min   Charges:   PT  Evaluation $PT Eval Moderate Complexity: [place], PT Acute Rehabilitation  Office: [phone] Pager: [phone]

## 2021-05-18 LAB — CBC
HCT: 36.4 % — ABNORMAL LOW (ref 39.0–52.0)
Hemoglobin: 11.9 g/dL — ABNORMAL LOW (ref 13.0–17.0)
MCH: 28.7 pg (ref 26.0–34.0)
MCHC: 32.7 g/dL (ref 30.0–36.0)
MCV: 87.9 fL (ref 80.0–100.0)
Platelets: 239 10*3/uL (ref 150–400)
RBC: 4.14 MIL/uL — ABNORMAL LOW (ref 4.22–5.81)
RDW: 14 % (ref 11.5–15.5)
WBC: 7.6 10*3/uL (ref 4.0–10.5)
nRBC: 0 % (ref 0.0–0.2)

## 2021-05-18 LAB — BASIC METABOLIC PANEL
Anion gap: 5 (ref 5–15)
BUN: 18 mg/dL (ref 8–23)
CO2: 27 mmol/L (ref 22–32)
Calcium: 8.6 mg/dL — ABNORMAL LOW (ref 8.9–10.3)
Chloride: 104 mmol/L (ref 98–111)
Creatinine, Ser: 1.11 mg/dL (ref 0.61–1.24)
GFR, Estimated: >60 mL/min (ref >60)
Glucose, Bld: 112 mg/dL — ABNORMAL HIGH (ref 70–99)
Potassium: 3.8 mmol/L (ref 3.5–5.1)
Sodium: 136 mmol/L (ref 135–145)

## 2021-05-18 LAB — GLUCOSE, CAPILLARY
Glucose-Capillary: 118 mg/dL — ABNORMAL HIGH (ref 70–99)
Glucose-Capillary: 123 mg/dL — ABNORMAL HIGH (ref 70–99)

## 2021-05-18 LAB — CULTURE, BLOOD (ROUTINE X 2)
Culture: NO GROWTH
Culture: NO GROWTH
Special Requests: ADEQUATE

## 2021-05-18 MED ORDER — ACETAMINOPHEN 325 MG PO TABS: 650.0000 mg | ORAL_TABLET | Freq: Four times a day (QID) | ORAL | Status: AC | PRN

## 2021-05-18 MED ORDER — DOXYCYCLINE HYCLATE 100 MG PO TABS: 100.0000 mg | ORAL_TABLET | Freq: Two times a day (BID) | ORAL | 0 refills | Status: AC

## 2021-05-18 MED ORDER — DOXYCYCLINE HYCLATE 100 MG PO TABS
100.0000 mg | ORAL_TABLET | Freq: Two times a day (BID) | ORAL | Status: DC
  Administered 2021-05-18: 100 mg via ORAL
  Filled 2021-05-18: qty 1

## 2021-05-18 NOTE — Progress Notes (Signed)
Reviewed written d/c instructions w pt and all questions answered. He verbalized understanding. D/C per w/c w all belongings in stable condition.

## 2021-05-18 NOTE — Discharge Summary (Addendum)
DISCHARGE SUMMARY  Miguel [name]  MR#: [id number]  DOB:1950/06/07  Date of Admission: 05/15/2021 Date of Discharge: 05/18/2021  Attending Physician:Isair Inabinet Hennie Duos, MD  Patient's ELF:[name], Miguel Butts, MD  Consults: Podiatry   Disposition: D/C home   Follow-up Appts:  Follow-up Information     Cari Caraway, MD Follow up in 1 week(s).   Specialty: Family Medicine Contact information: [place] [address] [phone]                 Tests Needing Follow-up: -assess CBG and BP control   Discharge Diagnoses: Osteomyelitis of right second toe Gangrenous ulcer, gangrenous cellulitis and acute osteomyelitis of R second toe HTN HLD BPH Gout Chronic peripheral neuropathy Impaired fasting glucose Obesity - Body mass index is 32.27 kg/m.   Initial presentation: 71 year old with a history of HTN, BPH, HLD, neuropathy, and obesity who presented to the ED with complaints of a right second toe infection after recently having been seen in the ED.  Hospital Course:  Osteomyelitis of right second toe Status post amputation 1/16 per Podiatry -covered with broad-spectrum IV antibiotic initially -cultures remained negative throughout hospital stay - discharge home 1/18 with transition to oral doxycycline for 2 weeks additional therapy - follow-up with Podiatry in 1 week as outpatient   HTN Continue usual home medical therapy   HLD Continue Crestor without change   BPH Continue Avodart without change   Gout Well-controlled with colchicine   Chronic peripheral neuropathy Continue gabapentin without change   Impaired fasting glucose SSI utilized during hospital stay - A1c 6.2 - will need outpatient follow-up with his PCP   Obesity - Body mass index is 32.27 kg/m.    Allergies as of 05/18/2021       Reactions   Fenofibrate Other (See Comments)   Caused gallstones   Gemfibrozil Other (See Comments)   Severe muscle pain   Simvastatin  Other (See Comments)   Severe muscle pain        Medication List     STOP taking these medications    colchicine 0.6 MG tablet       TAKE these medications    acetaminophen 325 MG tablet Commonly known as: TYLENOL Take 2 tablets (650 mg total) by mouth every 6 (six) hours as needed for mild pain (or Fever >/= 101).   bisoprolol 5 MG tablet Commonly known as: ZEBETA Take 5 mg by mouth daily.   doxycycline 100 MG tablet Commonly known as: VIBRA-TABS Take 1 tablet (100 mg total) by mouth every 12 (twelve) hours for 14 days.   dutasteride 0.5 MG capsule Commonly known as: AVODART Take 0.5 mg by mouth daily.   gabapentin 300 MG capsule Commonly known as: NEURONTIN Take 600 mg by mouth daily in the afternoon.   losartan-hydrochlorothiazide 100-12.5 MG tablet Commonly known as: HYZAAR Take 1 tablet by mouth daily.   metFORMIN 500 MG 24 hr tablet Commonly known as: GLUCOPHAGE-XR Take 500 mg by mouth at bedtime.   naproxen 500 MG tablet Commonly known as: NAPROSYN Take 500-1,000 mg by mouth daily as needed for moderate pain.   Quercetin 500 MG Caps Take 500 mg by mouth daily as needed (for allergies).   rosuvastatin 20 MG tablet Commonly known as: CRESTOR Take 20 mg by mouth every evening.   Ubiquinol 100 MG Caps Take 100 capsules by mouth 2 (two) times daily.   Vitamin B Complex Tabs Take 1 tablet by mouth daily.   Vitamin D-3 125 MCG (5000  UT) Tabs Take 5,000 Units by mouth daily.        Day of Discharge BP 127/77 (BP Location: Right Arm)    Pulse (!) 59    Temp 98.6 F (37 C) (Oral)    Resp 16    Ht 5\' 10"  (1.778 m)    Wt 102 kg    SpO2 91%    BMI 32.27 kg/m   Physical Exam: General: No acute respiratory distress Lungs: Clear to auscultation bilaterally without wheezes or crackles Cardiovascular: Regular rate and rhythm without murmur gallop or rub normal S1 and S2 Abdomen: Nontender, nondistended, soft, bowel sounds positive, no rebound, no  ascites, no appreciable mass Extremities: No significant cyanosis, clubbing, or edema bilateral lower extremities - foot wound dressed and dry w/ pt denying pain of foot   Basic Metabolic Panel: Recent Labs  Lab 05/13/21 1222 05/14/21 0500 05/15/21 1224 05/18/21 0353  NA 139 140 139 136  K 4.2 4.0 3.7 3.8  CL 105 105 104 104  CO2 27 26 27 27   GLUCOSE 116* 118* 114* 112*  BUN 20 18 19 18   CREATININE 0.80 0.81 0.74 1.11  CALCIUM 9.3 9.3 9.2 8.6*    Liver Function Tests: Recent Labs  Lab 05/13/21 1222  AST 16  ALT 20  ALKPHOS 39  BILITOT 0.7  PROT 7.3  ALBUMIN 3.9    CBC: Recent Labs  Lab 05/13/21 1222 05/14/21 0500 05/15/21 1224 05/18/21 0353  WBC 6.7 5.5 6.8 7.6  NEUTROABS 4.9  --  5.1  --   HGB 13.0 12.5* 13.0 11.9*  HCT 40.6 38.3* 40.2 36.4*  MCV 88.5 87.6 87.0 87.9  PLT 264 232 266 239    CBG: Recent Labs  Lab 05/17/21 0754 05/17/21 1221 05/17/21 1658 05/17/21 2117 05/18/21 0724  GLUCAP 92 104* 107* 128* 118*    Recent Results (from the past 240 hour(s))  Resp Panel by RT-PCR (Flu A&B, Covid) Nasopharyngeal Swab     Status: None   Collection Time: 05/13/21 12:22 PM   Specimen: Nasopharyngeal Swab; Nasopharyngeal(NP) swabs in vial transport medium  Result Value Ref Range Status   SARS Coronavirus 2 by RT PCR NEGATIVE NEGATIVE Final    Comment: (NOTE) SARS-CoV-2 target nucleic acids are NOT DETECTED.  The SARS-CoV-2 RNA is generally detectable in upper respiratory specimens during the acute phase of infection. The lowest concentration of SARS-CoV-2 viral copies this assay can detect is 138 copies/mL. A negative result does not preclude SARS-Cov-2 infection and should not be used as the sole basis for treatment or other patient management decisions. A negative result may occur with  improper specimen collection/handling, submission of specimen other than nasopharyngeal swab, presence of viral mutation(s) within the areas targeted by this  assay, and inadequate number of viral copies(<138 copies/mL). A negative result must be combined with clinical observations, patient history, and epidemiological information. The expected result is Negative.  Fact Sheet for Patients:  EntrepreneurPulse.com.au  Fact Sheet for Healthcare Providers:  IncredibleEmployment.be  This test is no t yet approved or cleared by the [place] FDA and  has been authorized for detection and/or diagnosis of SARS-CoV-2 by FDA under an Emergency Use Authorization (EUA). This EUA will remain  in effect (meaning this test can be used) for the duration of the COVID-19 declaration under Section 564(b)(1) of the Act, 21 U.S.C.section 360bbb-3(b)(1), unless the authorization is terminated  or revoked sooner.       Influenza A by PCR NEGATIVE NEGATIVE Final   Influenza  B by PCR NEGATIVE NEGATIVE Final    Comment: (NOTE) The Xpert Xpress SARS-CoV-2/FLU/RSV plus assay is intended as an aid in the diagnosis of influenza from Nasopharyngeal swab specimens and should not be used as a sole basis for treatment. Nasal washings and aspirates are unacceptable for Xpert Xpress SARS-CoV-2/FLU/RSV testing.  Fact Sheet for Patients: EntrepreneurPulse.com.au  Fact Sheet for Healthcare Providers: IncredibleEmployment.be  This test is not yet approved or cleared by the [place] FDA and has been authorized for detection and/or diagnosis of SARS-CoV-2 by FDA under an Emergency Use Authorization (EUA). This EUA will remain in effect (meaning this test can be used) for the duration of the COVID-19 declaration under Section 564(b)(1) of the Act, 21 U.S.C. section 360bbb-3(b)(1), unless the authorization is terminated or revoked.  Performed at [facility], [place] [address] [place], [place] [address]   Blood Culture (routine x 2)     Status: None   Collection Time:  05/13/21 12:22 PM   Specimen: BLOOD  Result Value Ref Range Status   Specimen Description   Final    BLOOD BLOOD LEFT FOREARM Performed at [place] [address] [place], [place] [address]    Special Requests   Final    BOTTLES DRAWN AEROBIC AND ANAEROBIC Blood Culture results may not be optimal due to an inadequate volume of blood received in culture bottles Performed at [place] [address], [place], [place] [address]    Culture   Final    NO GROWTH 5 DAYS Performed at [facility], [place] [address], [place], [place] [address]    Report Status 05/18/2021 FINAL  Final  Blood Culture (routine x 2)     Status: None   Collection Time: 05/13/21 12:27 PM   Specimen: BLOOD  Result Value Ref Range Status   Specimen Description   Final    BLOOD BLOOD RIGHT FOREARM Performed at [place] [address] [place], [place] [address]    Special Requests   Final    Blood Culture adequate volume BOTTLES DRAWN AEROBIC AND ANAEROBIC Performed at [place] [address] [place], [place] [address]    Culture   Final    NO GROWTH 5 DAYS Performed at [facility], [place] [address] [place], [place] [address]    Report Status 05/18/2021 FINAL  Final  Surgical pcr screen     Status: None   Collection Time: 05/16/21  7:34 AM   Specimen: Nasal Mucosa; Nasal Swab  Result Value Ref Range Status   MRSA, PCR NEGATIVE NEGATIVE Final   Staphylococcus aureus NEGATIVE NEGATIVE Final    Comment: (NOTE) The Xpert SA Assay (FDA approved for NASAL specimens in patients 72 years of age and older), is one component of a comprehensive surveillance program. It is not intended to diagnose infection nor to guide or monitor treatment. Performed at [facility], [place] [address], [place], [place] [address]   Aerobic/Anaerobic Culture w Gram Stain (surgical/deep wound)     Status: None  (Preliminary result)   Collection Time: 05/16/21  3:09 PM   Specimen: PATH Soft tissue  Result Value Ref Range Status   Specimen Description   Final    TISSUE Performed at [place] [address], [place], [place] [address]    Special Requests   Final    NONE Performed at [facility], [place] [address] [place],  [place] [address]    Gram Stain   Final    NO SQUAMOUS EPITHELIAL CELLS SEEN FEW WBC SEEN NO ORGANISMS SEEN    Culture   Final    NO GROWTH < 24 HOURS Performed at [place] [address] [place], [place] [address]    Report Status PENDING  Incomplete      Time spent in discharge (includes decision making & examination of pt): 30 minutes  05/18/2021, 11:27 AM   Cherene Altes, MD [facility] Office  [phone]

## 2021-05-18 NOTE — Progress Notes (Signed)
Physical Therapy Treatment Patient Details Name: Miguel [name] MRN: [id number] DOB: Feb 12, 1951 Today's Date: 05/18/2021   History of Present Illness 71 yo male admitted with osteomyelitis. S/P R 2nd toe and distal met amputation 05/16/21. Hx of gout, obesity, neuropathy    PT Comments    Pt agreeable to working with PT. He walked in the hallway and went up and down a few stairs. He tolerated activity well.   Recommendations for follow up therapy are one component of a multi-disciplinary discharge planning process, led by the attending physician.  Recommendations may be updated based on patient status, additional functional criteria and insurance authorization.  Follow Up Recommendations  No PT follow up     Assistance Recommended at Discharge None  Patient can return home with the following     Equipment Recommendations  None recommended by PT    Recommendations for Other Services       Precautions / Restrictions Precautions Precautions: Fall Other Brace: post op shoe R foot Restrictions Weight Bearing Restrictions: Yes RLE Weight Bearing: Weight bearing as tolerated Other Position/Activity Restrictions: WBAT with post op shoe     Mobility  Bed Mobility Overal bed mobility: Modified Independent                  Transfers Overall transfer level: Modified independent                      Ambulation/Gait Ambulation/Gait assistance: Min guard Gait Distance (Feet): 135 Feet Assistive device: None Gait Pattern/deviations: Step-through pattern, Decreased stride length       General Gait Details: Intermittent unsteadiness/imbalance.   Stairs Stairs: Yes Stairs assistance: Min guard Stair Management: One rail Right, Step to pattern, Forwards Number of Stairs: 6 General stair comments: cues for safety, sequence.   Wheelchair Mobility    Modified Rankin (Stroke Patients Only)       Balance Overall balance assessment: Needs assistance          Standing balance support: No upper extremity supported, During functional activity Standing balance-Leahy Scale: Fair                              Cognition Arousal/Alertness: Awake/alert Behavior During Therapy: WFL for tasks assessed/performed Overall Cognitive Status: Within Functional Limits for tasks assessed                                          Exercises      General Comments        Pertinent Vitals/Pain Pain Assessment Pain Assessment: Faces Faces Pain Scale: Hurts a little bit Pain Location: R foot Pain Descriptors / Indicators: Discomfort, Sore Pain Intervention(s): Monitored during session    Home Living                          Prior Function            PT Goals (current goals can now be found in the care plan section) Progress towards PT goals: Progressing toward goals    Frequency    Min 3X/week      PT Plan Current plan remains appropriate    Co-evaluation              AM-PAC PT "6 Clicks" Mobility   Outcome Measure  Help  needed turning from your back to your side while in a flat bed without using bedrails?: None Help needed moving from lying on your back to sitting on the side of a flat bed without using bedrails?: None Help needed moving to and from a bed to a chair (including a wheelchair)?: None Help needed standing up from a chair using your arms (e.g., wheelchair or bedside chair)?: None Help needed to walk in hospital room?: A Little Help needed climbing 3-5 steps with a railing? : A Little 6 Click Score: 22    End of Session   Activity Tolerance: Patient tolerated treatment well Patient left: in bed;with call bell/phone within reach   PT Visit Diagnosis: Unsteadiness on feet (R26.81);Difficulty in walking, not elsewhere classified (R26.2)     Time: 1042-1050 PT Time Calculation (min) (ACUTE ONLY): 8 min  Charges:  $Gait Training: 8-22 mins                         Doreatha Massed, PT Acute Rehabilitation  Office: [phone] Pager: [phone]

## 2021-05-19 LAB — SURGICAL PATHOLOGY

## 2021-05-20 ENCOUNTER — Encounter: Payer: Self-pay | Admitting: Podiatry

## 2021-05-20 ENCOUNTER — Other Ambulatory Visit: Payer: Self-pay

## 2021-05-20 ENCOUNTER — Ambulatory Visit (INDEPENDENT_AMBULATORY_CARE_PROVIDER_SITE_OTHER): Payer: Medicare (Managed Care) | Admitting: Podiatry

## 2021-05-20 DIAGNOSIS — M86171 Other acute osteomyelitis, right ankle and foot: Secondary | ICD-10-CM

## 2021-05-20 DIAGNOSIS — Z9889 Other specified postprocedural states: Secondary | ICD-10-CM

## 2021-05-20 NOTE — Progress Notes (Signed)
Subjective:  Patient ID: Miguel [name], male    DOB: [birth year],  MRN: [id number]  Chief Complaint  Patient presents with   Routine Post Op    Pt Is here for POV1 . Slight pain on top of foot. (Right) No n/v/f/SOB/CP    DOS: 05/16/20 Procedure: Right second digit amputation  71 y.o. male returns for POV#1. Patient relates he is doing well. Getting some pain on the top of his foot. Relates he has been taking the antibiotics and kept the dressing dry.   Review of Systems: Negative except as noted in the HPI. Denies N/V/F/Ch.  Past Medical History:  Diagnosis Date   Class 1 obesity 05/13/2021    Current Outpatient Medications:    acetaminophen (TYLENOL) 325 MG tablet, Take 2 tablets (650 mg total) by mouth every 6 (six) hours as needed for mild pain (or Fever >/= 101)., Disp: , Rfl:    B Complex Vitamins (VITAMIN B COMPLEX) TABS, Take 1 tablet by mouth daily., Disp: , Rfl:    bisoprolol (ZEBETA) 5 MG tablet, Take 5 mg by mouth daily., Disp: , Rfl:    Cholecalciferol (VITAMIN D-3) 125 MCG (5000 UT) TABS, Take 5,000 Units by mouth daily., Disp: , Rfl:    doxycycline (VIBRA-TABS) 100 MG tablet, Take 1 tablet (100 mg total) by mouth every 12 (twelve) hours for 14 days., Disp: 28 tablet, Rfl: 0   dutasteride (AVODART) 0.5 MG capsule, Take 0.5 mg by mouth daily., Disp: , Rfl:    gabapentin (NEURONTIN) 300 MG capsule, Take 600 mg by mouth daily in the afternoon., Disp: , Rfl:    losartan-hydrochlorothiazide (HYZAAR) 100-12.5 MG tablet, Take 1 tablet by mouth daily., Disp: , Rfl:    metFORMIN (GLUCOPHAGE-XR) 500 MG 24 hr tablet, Take 500 mg by mouth at bedtime., Disp: , Rfl:    naproxen (NAPROSYN) 500 MG tablet, Take 500-1,000 mg by mouth daily as needed for moderate pain., Disp: , Rfl:    Quercetin 500 MG CAPS, Take 500 mg by mouth daily as needed (for allergies)., Disp: , Rfl:    rosuvastatin (CRESTOR) 20 MG tablet, Take 20 mg by mouth every evening., Disp: , Rfl:    Ubiquinol 100 MG CAPS,  Take 100 capsules by mouth 2 (two) times daily., Disp: , Rfl:   Social History   Tobacco Use  Smoking Status Never  Smokeless Tobacco Never    Allergies  Allergen Reactions   Fenofibrate Other (See Comments)    Caused gallstones   Gemfibrozil Other (See Comments)    Severe muscle pain   Simvastatin Other (See Comments)    Severe muscle pain   Objective:  There were no vitals filed for this visit. There is no height or weight on file to calculate BMI. Constitutional Well developed. Well nourished.  Vascular Foot warm and well perfused. Capillary refill normal to all digits.   Neurologic Normal speech. Oriented to person, place, and time. Epicritic sensation to light touch grossly present bilaterally.  Dermatologic Skin healing well without signs of infection. Skin edges well coapted without signs of infection.  Orthopedic: Tenderness to palpation noted about the surgical site.    Assessment:   1. Status post surgery   2. Acute osteomyelitis of toe of right foot ([place])    Plan:  Patient was evaluated and treated and all questions answered.  S/p foot surgery right -Progressing as expected post-operatively. -XR: Will get XR on next visit.  -WB Status: WBAT in surgical shoe  -Sutures: intact. -Medications: N/a -  Foot redressed.  Return in about 5 days (around 05/25/2021) for wound check.

## 2021-05-21 LAB — AEROBIC/ANAEROBIC CULTURE W GRAM STAIN (SURGICAL/DEEP WOUND): Gram Stain: NONE SEEN

## 2021-05-27 DIAGNOSIS — R7301 Impaired fasting glucose: Secondary | ICD-10-CM | POA: Diagnosis not present

## 2021-05-27 DIAGNOSIS — I10 Essential (primary) hypertension: Secondary | ICD-10-CM | POA: Diagnosis not present

## 2021-05-27 DIAGNOSIS — G4733 Obstructive sleep apnea (adult) (pediatric): Secondary | ICD-10-CM | POA: Diagnosis not present

## 2021-05-27 DIAGNOSIS — M869 Osteomyelitis, unspecified: Secondary | ICD-10-CM | POA: Diagnosis not present

## 2021-05-27 DIAGNOSIS — S98131A Complete traumatic amputation of one right lesser toe, initial encounter: Secondary | ICD-10-CM | POA: Diagnosis not present

## 2021-05-27 DIAGNOSIS — M1990 Unspecified osteoarthritis, unspecified site: Secondary | ICD-10-CM | POA: Diagnosis not present

## 2021-05-30 ENCOUNTER — Other Ambulatory Visit: Payer: Self-pay

## 2021-05-30 ENCOUNTER — Ambulatory Visit (INDEPENDENT_AMBULATORY_CARE_PROVIDER_SITE_OTHER): Payer: Medicare (Managed Care) | Admitting: Podiatry

## 2021-05-30 ENCOUNTER — Ambulatory Visit (INDEPENDENT_AMBULATORY_CARE_PROVIDER_SITE_OTHER): Payer: Medicare (Managed Care)

## 2021-05-30 ENCOUNTER — Encounter: Payer: Self-pay | Admitting: Podiatry

## 2021-05-30 DIAGNOSIS — M86171 Other acute osteomyelitis, right ankle and foot: Secondary | ICD-10-CM

## 2021-05-30 DIAGNOSIS — Z9889 Other specified postprocedural states: Secondary | ICD-10-CM

## 2021-05-30 NOTE — Progress Notes (Signed)
Subjective:  Patient ID: Miguel [name], male    DOB: 09-23-1950,  MRN: [id number]  Chief Complaint  Patient presents with   Routine Post Op    Pt is here for POV 2. Reports slight pain. Area is red    DOS: 05/16/20 Procedure: Right second digit amputation  71 y.o. male returns for POV#2. Patient relates he has had more pain and redness in the foot. Has kept the dressing dry and intact.   Review of Systems: Negative except as noted in the HPI. Denies N/V/F/Ch.  Past Medical History:  Diagnosis Date   Class 1 obesity 05/13/2021    Current Outpatient Medications:    acetaminophen (TYLENOL) 325 MG tablet, Take 2 tablets (650 mg total) by mouth every 6 (six) hours as needed for mild pain (or Fever >/= 101)., Disp: , Rfl:    B Complex Vitamins (VITAMIN B COMPLEX) TABS, Take 1 tablet by mouth daily., Disp: , Rfl:    bisoprolol (ZEBETA) 5 MG tablet, Take 5 mg by mouth daily., Disp: , Rfl:    Cholecalciferol (VITAMIN D-3) 125 MCG (5000 UT) TABS, Take 5,000 Units by mouth daily., Disp: , Rfl:    doxycycline (VIBRA-TABS) 100 MG tablet, Take 1 tablet (100 mg total) by mouth every 12 (twelve) hours for 14 days., Disp: 28 tablet, Rfl: 0   dutasteride (AVODART) 0.5 MG capsule, Take 0.5 mg by mouth daily., Disp: , Rfl:    gabapentin (NEURONTIN) 300 MG capsule, Take 600 mg by mouth daily in the afternoon., Disp: , Rfl:    losartan-hydrochlorothiazide (HYZAAR) 100-12.5 MG tablet, Take 1 tablet by mouth daily., Disp: , Rfl:    metFORMIN (GLUCOPHAGE-XR) 500 MG 24 hr tablet, Take 500 mg by mouth at bedtime., Disp: , Rfl:    naproxen (NAPROSYN) 500 MG tablet, Take 500-1,000 mg by mouth daily as needed for moderate pain., Disp: , Rfl:    Quercetin 500 MG CAPS, Take 500 mg by mouth daily as needed (for allergies)., Disp: , Rfl:    rosuvastatin (CRESTOR) 20 MG tablet, Take 20 mg by mouth every evening., Disp: , Rfl:    Ubiquinol 100 MG CAPS, Take 100 capsules by mouth 2 (two) times daily., Disp: , Rfl:    Social History   Tobacco Use  Smoking Status Never  Smokeless Tobacco Never    Allergies  Allergen Reactions   Fenofibrate Other (See Comments)    Caused gallstones   Gemfibrozil Other (See Comments)    Severe muscle pain   Simvastatin Other (See Comments)    Severe muscle pain   Objective:  There were no vitals filed for this visit. There is no height or weight on file to calculate BMI. Constitutional Well developed. Well nourished.  Vascular Foot warm and well perfused. Capillary refill normal to all digits.   Neurologic Normal speech. Oriented to person, place, and time. Epicritic sensation to light touch grossly present bilaterally.  Dermatologic Skin healing well with erythema over dorsal foot noted No areas of dehiscence.   Orthopedic: Tenderness to palpation noted about the surgical site.    Assessment:   1. Acute osteomyelitis of toe of right foot ([place])    Plan:  Patient was evaluated and treated and all questions answered.  S/p foot surgery right -XR: Possible erosions noted to second metatarsal residual head.  -WB Status: WBAT in surgical shoe  -Sutures: removed  -Concern for residual infection. Will send for stat MRI.  -Doxycylcine renewed.  -Foot redressed. -Return in one week.  No follow-ups on file.

## 2021-05-31 ENCOUNTER — Telehealth: Payer: Self-pay | Admitting: Podiatry

## 2021-05-31 ENCOUNTER — Other Ambulatory Visit: Payer: Self-pay | Admitting: Podiatry

## 2021-05-31 DIAGNOSIS — M170 Bilateral primary osteoarthritis of knee: Secondary | ICD-10-CM | POA: Diagnosis not present

## 2021-05-31 DIAGNOSIS — E785 Hyperlipidemia, unspecified: Secondary | ICD-10-CM | POA: Diagnosis not present

## 2021-05-31 DIAGNOSIS — I10 Essential (primary) hypertension: Secondary | ICD-10-CM | POA: Diagnosis not present

## 2021-05-31 DIAGNOSIS — M1990 Unspecified osteoarthritis, unspecified site: Secondary | ICD-10-CM | POA: Diagnosis not present

## 2021-05-31 DIAGNOSIS — M86171 Other acute osteomyelitis, right ankle and foot: Secondary | ICD-10-CM

## 2021-05-31 DIAGNOSIS — Z9889 Other specified postprocedural states: Secondary | ICD-10-CM

## 2021-05-31 DIAGNOSIS — N400 Enlarged prostate without lower urinary tract symptoms: Secondary | ICD-10-CM | POA: Diagnosis not present

## 2021-05-31 NOTE — Telephone Encounter (Signed)
Yes, I can take care of that!

## 2021-05-31 NOTE — Telephone Encounter (Signed)
Referral for MRI faxed to [facility] today, with instructions to contact patient for scheduling.

## 2021-05-31 NOTE — Telephone Encounter (Signed)
Pt called stating [facility] is out of network with his insurance.   He is requesting to have that order resent to [facility], due to them being in network with his insurance.    Contact info for [facility]  # [phone]  (f)# [phone]  Please advise.

## 2021-06-06 ENCOUNTER — Other Ambulatory Visit: Payer: Self-pay

## 2021-06-06 ENCOUNTER — Ambulatory Visit (INDEPENDENT_AMBULATORY_CARE_PROVIDER_SITE_OTHER): Payer: Medicare (Managed Care) | Admitting: Podiatry

## 2021-06-06 ENCOUNTER — Encounter: Payer: Self-pay | Admitting: Podiatry

## 2021-06-06 DIAGNOSIS — Z9889 Other specified postprocedural states: Secondary | ICD-10-CM

## 2021-06-06 MED ORDER — DOXYCYCLINE MONOHYDRATE 100 MG PO CAPS: 100.0000 mg | ORAL_CAPSULE | Freq: Two times a day (BID) | ORAL | 0 refills | Status: DC

## 2021-06-06 NOTE — Progress Notes (Signed)
°  Subjective:  Patient ID: Miguel [name], male    DOB: 1951/02/02,  MRN: [id number]  No chief complaint on file.   DOS: 05/16/20 Procedure: Right second digit amputation  71 y.o. male returns for POV#3. Patient relates unable to get MRI yet as where we sent him was not in network so recent referral to [facility].  Has kept the dressing dry and intact.   Review of Systems: Negative except as noted in the HPI. Denies N/V/F/Ch.  Past Medical History:  Diagnosis Date   Class 1 obesity 05/13/2021    Current Outpatient Medications:    acetaminophen (TYLENOL) 325 MG tablet, Take 2 tablets (650 mg total) by mouth every 6 (six) hours as needed for mild pain (or Fever >/= 101)., Disp: , Rfl:    B Complex Vitamins (VITAMIN B COMPLEX) TABS, Take 1 tablet by mouth daily., Disp: , Rfl:    bisoprolol (ZEBETA) 5 MG tablet, Take 5 mg by mouth daily., Disp: , Rfl:    Cholecalciferol (VITAMIN D-3) 125 MCG (5000 UT) TABS, Take 5,000 Units by mouth daily., Disp: , Rfl:    dutasteride (AVODART) 0.5 MG capsule, Take 0.5 mg by mouth daily., Disp: , Rfl:    gabapentin (NEURONTIN) 300 MG capsule, Take 600 mg by mouth daily in the afternoon., Disp: , Rfl:    losartan-hydrochlorothiazide (HYZAAR) 100-12.5 MG tablet, Take 1 tablet by mouth daily., Disp: , Rfl:    metFORMIN (GLUCOPHAGE-XR) 500 MG 24 hr tablet, Take 500 mg by mouth at bedtime., Disp: , Rfl:    naproxen (NAPROSYN) 500 MG tablet, Take 500-1,000 mg by mouth daily as needed for moderate pain., Disp: , Rfl:    Quercetin 500 MG CAPS, Take 500 mg by mouth daily as needed (for allergies)., Disp: , Rfl:    rosuvastatin (CRESTOR) 20 MG tablet, Take 20 mg by mouth every evening., Disp: , Rfl:    Ubiquinol 100 MG CAPS, Take 100 capsules by mouth 2 (two) times daily., Disp: , Rfl:   Social History   Tobacco Use  Smoking Status Never  Smokeless Tobacco Never    Allergies  Allergen Reactions   Fenofibrate Other (See Comments)    Caused gallstones    Gemfibrozil Other (See Comments)    Severe muscle pain   Simvastatin Other (See Comments)    Severe muscle pain   Objective:  There were no vitals filed for this visit. There is no height or weight on file to calculate BMI. Constitutional Well developed. Well nourished.  Vascular Foot warm and well perfused. Capillary refill normal to all digits.   Neurologic Normal speech. Oriented to person, place, and time. Epicritic sensation to light touch grossly present bilaterally.  Dermatologic Skin healing well with erythema over dorsal foot noted. Erythema and edema have improved.  No areas of dehiscence.   Orthopedic: Tenderness to palpation noted about the surgical site.    Assessment:   1. Status post surgery     Plan:  Patient was evaluated and treated and all questions answered.  S/p foot surgery right -XR: Possible erosions noted to second metatarsal residual head.  -Patient to be schedule for MRI at [facility] as that is what is in network for him.  -WB Status: WBAT in surgical shoe  -Concern for residual infection.  -Continue doxycycline. Re-ordered   -Foot redressed. -Return in 2 weeks for re-check and hopefully we will have MRI.   No follow-ups on file.

## 2021-06-08 ENCOUNTER — Telehealth: Payer: Self-pay | Admitting: *Deleted

## 2021-06-08 DIAGNOSIS — L821 Other seborrheic keratosis: Secondary | ICD-10-CM | POA: Diagnosis not present

## 2021-06-08 DIAGNOSIS — L578 Other skin changes due to chronic exposure to nonionizing radiation: Secondary | ICD-10-CM | POA: Diagnosis not present

## 2021-06-08 DIAGNOSIS — D225 Melanocytic nevi of trunk: Secondary | ICD-10-CM | POA: Diagnosis not present

## 2021-06-08 DIAGNOSIS — D485 Neoplasm of uncertain behavior of skin: Secondary | ICD-10-CM | POA: Diagnosis not present

## 2021-06-08 DIAGNOSIS — D0359 Melanoma in situ of other part of trunk: Secondary | ICD-10-CM | POA: Diagnosis not present

## 2021-06-08 DIAGNOSIS — C44519 Basal cell carcinoma of skin of other part of trunk: Secondary | ICD-10-CM | POA: Diagnosis not present

## 2021-06-08 NOTE — Telephone Encounter (Signed)
Patient is calling for status on MRI @[facility] ,went there after appointment and they had no record of this. Faxed referral to the [facility]. Received confirmation ,06/08/21.

## 2021-06-08 NOTE — Telephone Encounter (Signed)
Patient is calling for clarification on antibiotic sent to pharmacy, said that the first doxycycline (monohydrate) was different from one sent in today(hyclate), should he be concerned? Please advise.

## 2021-06-09 ENCOUNTER — Other Ambulatory Visit: Payer: Self-pay | Admitting: Podiatry

## 2021-06-09 MED ORDER — DOXYCYCLINE HYCLATE 100 MG PO TABS: 100.0000 mg | ORAL_TABLET | Freq: Two times a day (BID) | ORAL | 0 refills | Status: AC

## 2021-06-09 NOTE — Telephone Encounter (Signed)
Sent it over for him Thanks

## 2021-06-09 NOTE — Telephone Encounter (Signed)
Patient has been notified

## 2021-06-09 NOTE — Telephone Encounter (Signed)
Called the patien and he would like the doxycycline -hyclate, which is much cheaper for him.

## 2021-06-15 DIAGNOSIS — D0359 Melanoma in situ of other part of trunk: Secondary | ICD-10-CM | POA: Diagnosis not present

## 2021-06-16 ENCOUNTER — Encounter: Payer: Self-pay | Admitting: Podiatry

## 2021-06-16 ENCOUNTER — Other Ambulatory Visit: Payer: Self-pay

## 2021-06-16 ENCOUNTER — Ambulatory Visit (INDEPENDENT_AMBULATORY_CARE_PROVIDER_SITE_OTHER): Payer: Medicare (Managed Care) | Admitting: Podiatry

## 2021-06-16 DIAGNOSIS — M86171 Other acute osteomyelitis, right ankle and foot: Secondary | ICD-10-CM

## 2021-06-16 DIAGNOSIS — Z9889 Other specified postprocedural states: Secondary | ICD-10-CM

## 2021-06-16 NOTE — Progress Notes (Signed)
Subjective:  Patient ID: Miguel [name], male    DOB: 25-Nov-1950,  MRN: [id number]  Chief Complaint  Patient presents with   Wound Check    Right foot wound check    DOS: 05/16/20 Procedure: Right second digit amputation  71 y.o. male returns for POV#4.  Has kept the dressing dry and intact. Relates things are getting better does still have swelling but the pain is gon and the redness is improved. Is still waiting on MRI due to insurance.   Review of Systems: Negative except as noted in the HPI. Denies N/V/F/Ch.  Past Medical History:  Diagnosis Date   Class 1 obesity 05/13/2021    Current Outpatient Medications:    acetaminophen (TYLENOL) 325 MG tablet, Take 2 tablets (650 mg total) by mouth every 6 (six) hours as needed for mild pain (or Fever >/= 101)., Disp: , Rfl:    B Complex Vitamins (VITAMIN B COMPLEX) TABS, Take 1 tablet by mouth daily., Disp: , Rfl:    bisoprolol (ZEBETA) 5 MG tablet, Take 5 mg by mouth daily., Disp: , Rfl:    Cholecalciferol (VITAMIN D-3) 125 MCG (5000 UT) TABS, Take 5,000 Units by mouth daily., Disp: , Rfl:    doxycycline (VIBRA-TABS) 100 MG tablet, Take 1 tablet (100 mg total) by mouth 2 (two) times daily for 10 days., Disp: 20 tablet, Rfl: 0   dutasteride (AVODART) 0.5 MG capsule, Take 0.5 mg by mouth daily., Disp: , Rfl:    gabapentin (NEURONTIN) 300 MG capsule, Take 600 mg by mouth daily in the afternoon., Disp: , Rfl:    losartan-hydrochlorothiazide (HYZAAR) 100-12.5 MG tablet, Take 1 tablet by mouth daily., Disp: , Rfl:    metFORMIN (GLUCOPHAGE-XR) 500 MG 24 hr tablet, Take 500 mg by mouth at bedtime., Disp: , Rfl:    naproxen (NAPROSYN) 500 MG tablet, Take 500-1,000 mg by mouth daily as needed for moderate pain., Disp: , Rfl:    Quercetin 500 MG CAPS, Take 500 mg by mouth daily as needed (for allergies)., Disp: , Rfl:    rosuvastatin (CRESTOR) 20 MG tablet, Take 20 mg by mouth every evening., Disp: , Rfl:    Ubiquinol 100 MG CAPS, Take 100 capsules  by mouth 2 (two) times daily., Disp: , Rfl:   Social History   Tobacco Use  Smoking Status Never  Smokeless Tobacco Never    Allergies  Allergen Reactions   Fenofibrate Other (See Comments)    Caused gallstones   Gemfibrozil Other (See Comments)    Severe muscle pain   Simvastatin Other (See Comments)    Severe muscle pain   Objective:  There were no vitals filed for this visit. There is no height or weight on file to calculate BMI. Constitutional Well developed. Well nourished.  Vascular Foot warm and well perfused. Capillary refill normal to all digits.   Neurologic Normal speech. Oriented to person, place, and time. Epicritic sensation to light touch grossly present bilaterally.  Dermatologic Skin healing well with edema over the dorsal foot No erythema today.  No areas of dehiscence.  Scaling and xerosis noted to the circumfernetial foot.   Orthopedic: Tenderness to palpation noted about the surgical site.    Assessment:   1. Status post surgery   2. Acute osteomyelitis of toe of right foot ([place])      Plan:  Patient was evaluated and treated and all questions answered.  S/p foot surgery right -XR: Possible erosions noted to second metatarsal residual head from previous.  -Patient  to be schedule for MRI pending insurance approval. Will like to follow-up with patient after this to rule out any residual infection. He has had improvement on antibiotics and will continue to monitor.  -WB Status: WBAT in regular shoe. No open wounds and will try the transition.  -Still minor concern for residual infection.  -Finish out course of doxycycline and told him to keep an eye on any worsening redness swelling and pain. If he does he needs to come in to see me sooner.  -Foot redressed with sock.  -Return in 2 weeks for re-check and hopefully we will have MRI.   Return in about 2 weeks (around 06/30/2021) for post op.

## 2021-06-20 DIAGNOSIS — L0100 Impetigo, unspecified: Secondary | ICD-10-CM | POA: Diagnosis not present

## 2021-06-20 DIAGNOSIS — R531 Weakness: Secondary | ICD-10-CM | POA: Diagnosis not present

## 2021-06-22 NOTE — Telephone Encounter (Signed)
Pt has been notified.

## 2021-06-22 NOTE — Telephone Encounter (Signed)
Pt calling back requesting an update on the status of his MRI. States he has not heard back from anyone. Please advise.

## 2021-06-23 NOTE — Telephone Encounter (Signed)
[place], for status, has been scheduled for March 14 th @ 11:00.

## 2021-06-23 NOTE — Telephone Encounter (Signed)
Patient has been scheduled 07/12/21@11 :00

## 2021-06-27 DIAGNOSIS — L928 Other granulomatous disorders of the skin and subcutaneous tissue: Secondary | ICD-10-CM | POA: Diagnosis not present

## 2021-06-27 DIAGNOSIS — L0100 Impetigo, unspecified: Secondary | ICD-10-CM | POA: Diagnosis not present

## 2021-06-30 ENCOUNTER — Ambulatory Visit (INDEPENDENT_AMBULATORY_CARE_PROVIDER_SITE_OTHER): Payer: Medicare (Managed Care) | Admitting: Podiatry

## 2021-06-30 ENCOUNTER — Other Ambulatory Visit: Payer: Self-pay

## 2021-06-30 ENCOUNTER — Encounter: Payer: Self-pay | Admitting: Podiatry

## 2021-06-30 DIAGNOSIS — Z9889 Other specified postprocedural states: Secondary | ICD-10-CM

## 2021-06-30 NOTE — Progress Notes (Signed)
?Subjective:  ?Patient ID: Miguel [name], male    DOB: 1951/03/29,  MRN: [id number] ? ?Chief Complaint  ?Patient presents with  ? Wound Check  ?  Right foot wound , patient states wound is better since the last visit , there is no pain or redness and selling has went down tremendously   ? ? ?DOS: 05/16/20 ?Procedure: Right second digit amputation ? ?71 y.o. male returns for POV#5.  Relates a lot of improvement in swelling. No pain. MRI scheduled for 07/12/21 ? ?Review of Systems: Negative except as noted in the HPI. Denies N/V/F/Ch. ? ?Past Medical History:  ?Diagnosis Date  ? Class 1 obesity 05/13/2021  ? ? ?Current Outpatient Medications:  ?  acetaminophen (TYLENOL) 325 MG tablet, Take 2 tablets (650 mg total) by mouth every 6 (six) hours as needed for mild pain (or Fever >/= 101)., Disp: , Rfl:  ?  B Complex Vitamins (VITAMIN B COMPLEX) TABS, Take 1 tablet by mouth daily., Disp: , Rfl:  ?  bisoprolol (ZEBETA) 5 MG tablet, Take 5 mg by mouth daily., Disp: , Rfl:  ?  Cholecalciferol (VITAMIN D-3) 125 MCG (5000 UT) TABS, Take 5,000 Units by mouth daily., Disp: , Rfl:  ?  dutasteride (AVODART) 0.5 MG capsule, Take 0.5 mg by mouth daily., Disp: , Rfl:  ?  gabapentin (NEURONTIN) 300 MG capsule, Take 600 mg by mouth daily in the afternoon., Disp: , Rfl:  ?  losartan-hydrochlorothiazide (HYZAAR) 100-12.5 MG tablet, Take 1 tablet by mouth daily., Disp: , Rfl:  ?  metFORMIN (GLUCOPHAGE-XR) 500 MG 24 hr tablet, Take 500 mg by mouth at bedtime., Disp: , Rfl:  ?  naproxen (NAPROSYN) 500 MG tablet, Take 500-1,000 mg by mouth daily as needed for moderate pain., Disp: , Rfl:  ?  Quercetin 500 MG CAPS, Take 500 mg by mouth daily as needed (for allergies)., Disp: , Rfl:  ?  rosuvastatin (CRESTOR) 20 MG tablet, Take 20 mg by mouth every evening., Disp: , Rfl:  ?  Ubiquinol 100 MG CAPS, Take 100 capsules by mouth 2 (two) times daily., Disp: , Rfl:  ? ?Social History  ? ?Tobacco Use  ?Smoking Status Never  ?Smokeless Tobacco Never   ? ? ?Allergies  ?Allergen Reactions  ? Fenofibrate Other (See Comments)  ?  Caused gallstones  ? Gemfibrozil Other (See Comments)  ?  Severe muscle pain  ? Simvastatin Other (See Comments)  ?  Severe muscle pain  ? ?Objective:  ?There were no vitals filed for this visit. ?There is no height or weight on file to calculate BMI. ?Constitutional Well developed. ?Well nourished.  ?Vascular Foot warm and well perfused. ?Capillary refill normal to all digits.   ?Neurologic Normal speech. ?Oriented to person, place, and time. ?Epicritic sensation to light touch grossly present bilaterally.  ?Dermatologic Skin healing well with edema over the dorsal foot that is improved.  No erythema today.  No areas of dehiscence.  Scaling and xerosis noted to the circumfernetial foot.   ?Orthopedic: Tenderness to palpation noted about the surgical site.  ? ? ?Assessment:  ? ?1. Status post surgery   ? ? ? ?Plan:  ?Patient was evaluated and treated and all questions answered. ? ?S/p foot surgery right ?-XR: Possible erosions noted to second metatarsal residual head from previous.  ?-Patient to be schedule for MRI 07/12/21 ?-WB Status: WBAT in regular shoe. ?-No need for further antibiotics. Patient doing well.  ?-Foot redressed with sock.  ?-Return in 3 weeks for re-check and  hopefully we will have MRI.  ? ?Return in about 3 weeks (around 07/21/2021) for post op.  ? ?

## 2021-07-12 DIAGNOSIS — Z9889 Other specified postprocedural states: Secondary | ICD-10-CM | POA: Diagnosis not present

## 2021-07-12 DIAGNOSIS — M86171 Other acute osteomyelitis, right ankle and foot: Secondary | ICD-10-CM | POA: Diagnosis not present

## 2021-07-12 DIAGNOSIS — Z89421 Acquired absence of other right toe(s): Secondary | ICD-10-CM | POA: Diagnosis not present

## 2021-07-25 ENCOUNTER — Telehealth: Payer: Self-pay | Admitting: *Deleted

## 2021-07-25 NOTE — Telephone Encounter (Signed)
Patient is calling for his MRI results, please advise. Report is scanned in epic and ready to view in folder. ?

## 2021-08-01 DIAGNOSIS — H90A31 Mixed conductive and sensorineural hearing loss, unilateral, right ear with restricted hearing on the contralateral side: Secondary | ICD-10-CM | POA: Diagnosis not present

## 2021-08-04 ENCOUNTER — Encounter: Payer: Self-pay | Admitting: Podiatry

## 2021-08-04 ENCOUNTER — Ambulatory Visit (INDEPENDENT_AMBULATORY_CARE_PROVIDER_SITE_OTHER): Payer: Medicare (Managed Care) | Admitting: Podiatry

## 2021-08-04 DIAGNOSIS — Z9889 Other specified postprocedural states: Secondary | ICD-10-CM

## 2021-08-04 NOTE — Progress Notes (Signed)
?Subjective:  ?Patient ID: Miguel [name], male    DOB: Jun 14, 1950,  MRN: [id number] ? ?Chief Complaint  ?Patient presents with  ? Wound Check  ?  Right foot wound check   ? ? ?DOS: 05/16/20 ?Procedure: Right second digit amputation ? ?71 y.o. male returns for POV#6.  Relates a lot of improvement in swelling. No pain. Concerned about MRI results.  ? ?Review of Systems: Negative except as noted in the HPI. Denies N/V/F/Ch. ? ?Past Medical History:  ?Diagnosis Date  ? Class 1 obesity 05/13/2021  ? ? ?Current Outpatient Medications:  ?  acetaminophen (TYLENOL) 325 MG tablet, Take 2 tablets (650 mg total) by mouth every 6 (six) hours as needed for mild pain (or Fever >/= 101)., Disp: , Rfl:  ?  B Complex Vitamins (VITAMIN B COMPLEX) TABS, Take 1 tablet by mouth daily., Disp: , Rfl:  ?  bisoprolol (ZEBETA) 5 MG tablet, Take 5 mg by mouth daily., Disp: , Rfl:  ?  Cholecalciferol (VITAMIN D-3) 125 MCG (5000 UT) TABS, Take 5,000 Units by mouth daily., Disp: , Rfl:  ?  dutasteride (AVODART) 0.5 MG capsule, Take 0.5 mg by mouth daily., Disp: , Rfl:  ?  gabapentin (NEURONTIN) 300 MG capsule, Take 600 mg by mouth daily in the afternoon., Disp: , Rfl:  ?  losartan-hydrochlorothiazide (HYZAAR) 100-12.5 MG tablet, Take 1 tablet by mouth daily., Disp: , Rfl:  ?  metFORMIN (GLUCOPHAGE-XR) 500 MG 24 hr tablet, Take 500 mg by mouth at bedtime., Disp: , Rfl:  ?  naproxen (NAPROSYN) 500 MG tablet, Take 500-1,000 mg by mouth daily as needed for moderate pain., Disp: , Rfl:  ?  Quercetin 500 MG CAPS, Take 500 mg by mouth daily as needed (for allergies)., Disp: , Rfl:  ?  rosuvastatin (CRESTOR) 20 MG tablet, Take 20 mg by mouth every evening., Disp: , Rfl:  ?  Ubiquinol 100 MG CAPS, Take 100 capsules by mouth 2 (two) times daily., Disp: , Rfl:  ? ?Social History  ? ?Tobacco Use  ?Smoking Status Never  ?Smokeless Tobacco Never  ? ? ?Allergies  ?Allergen Reactions  ? Fenofibrate Other (See Comments)  ?  Caused gallstones  ? Gemfibrozil Other  (See Comments)  ?  Severe muscle pain  ? Simvastatin Other (See Comments)  ?  Severe muscle pain  ? ?Objective:  ?There were no vitals filed for this visit. ?There is no height or weight on file to calculate BMI. ?Constitutional Well developed. ?Well nourished.  ?Vascular Foot warm and well perfused. ?Capillary refill normal to all digits.   ?Neurologic Normal speech. ?Oriented to person, place, and time. ?Epicritic sensation to light touch grossly present bilaterally.  ?Dermatologic Skin healing well with edema over the dorsal foot that is improved.  No erythema today.  No areas of dehiscence.  Scaling and xerosis noted to the circumfernetial foot.   ?Orthopedic: Tenderness to palpation noted about the surgical site.  ?MRI Right foot  ?1.  Status post transmetatarsal amputation of the second ray at the level of the metatarsal neck. Acute osteomyelitis of the distal end of the remnant second metatarsal.  ?2.  Complicated peri-osseous collection at the distal end of the remnant second metatarsal is concerning for abscess/phlegmon.  ?3.  Likely early acute osteomyelitis of the medial third metatarsal head adjacent to the second metatarsal infection.  ?4.  Severe osteoarthritis of the first MTP joint with bulky circumferential osteophytes.  ?5.  Mild tenosynovitis of the flexor hallucis longus is favored mechanical  and related to the first MTP osteoarthritis. ?Assessment:  ? ?1. Status post surgery   ? ? ? ? ?Plan:  ?Patient was evaluated and treated and all questions answered. ? ?S/p foot surgery right ?-XR: Possible erosions noted to second metatarsal residual head from previous.  ?-MRI did read possible residual osteomyelits in second metatarsal and possible abscess.  ?However based on clinical progression and improvement. Do not feel at this point we need to do any aggressive treatment. Will continue to monitor foot. Discussed if he has any worsening pain redness swelling or any changes to come back and see me and  will address then.  ?-WB Status: WBAT in regular shoe. ?-No need for further antibiotics. Patient doing well.  ?-Return in one month to check on foot.  ? ?No follow-ups on file.  ? ?

## 2021-08-18 DIAGNOSIS — R7301 Impaired fasting glucose: Secondary | ICD-10-CM | POA: Diagnosis not present

## 2021-08-18 DIAGNOSIS — I10 Essential (primary) hypertension: Secondary | ICD-10-CM | POA: Diagnosis not present

## 2021-08-18 DIAGNOSIS — Z125 Encounter for screening for malignant neoplasm of prostate: Secondary | ICD-10-CM | POA: Diagnosis not present

## 2021-08-18 DIAGNOSIS — E785 Hyperlipidemia, unspecified: Secondary | ICD-10-CM | POA: Diagnosis not present

## 2021-08-24 DIAGNOSIS — I10 Essential (primary) hypertension: Secondary | ICD-10-CM | POA: Diagnosis not present

## 2021-08-24 DIAGNOSIS — R7303 Prediabetes: Secondary | ICD-10-CM | POA: Diagnosis not present

## 2021-08-24 DIAGNOSIS — M869 Osteomyelitis, unspecified: Secondary | ICD-10-CM | POA: Diagnosis not present

## 2021-08-24 DIAGNOSIS — G629 Polyneuropathy, unspecified: Secondary | ICD-10-CM | POA: Diagnosis not present

## 2021-08-24 DIAGNOSIS — R809 Proteinuria, unspecified: Secondary | ICD-10-CM | POA: Diagnosis not present

## 2021-08-24 DIAGNOSIS — Z79899 Other long term (current) drug therapy: Secondary | ICD-10-CM | POA: Diagnosis not present

## 2021-08-24 DIAGNOSIS — N400 Enlarged prostate without lower urinary tract symptoms: Secondary | ICD-10-CM | POA: Diagnosis not present

## 2021-08-24 DIAGNOSIS — E669 Obesity, unspecified: Secondary | ICD-10-CM | POA: Diagnosis not present

## 2021-08-24 DIAGNOSIS — E785 Hyperlipidemia, unspecified: Secondary | ICD-10-CM | POA: Diagnosis not present

## 2021-08-24 DIAGNOSIS — Z6834 Body mass index (BMI) 34.0-34.9, adult: Secondary | ICD-10-CM | POA: Diagnosis not present

## 2021-09-02 ENCOUNTER — Ambulatory Visit (INDEPENDENT_AMBULATORY_CARE_PROVIDER_SITE_OTHER): Payer: Medicare (Managed Care) | Admitting: Podiatry

## 2021-09-02 ENCOUNTER — Encounter: Payer: Self-pay | Admitting: Podiatry

## 2021-09-02 ENCOUNTER — Ambulatory Visit (INDEPENDENT_AMBULATORY_CARE_PROVIDER_SITE_OTHER): Payer: Medicare (Managed Care)

## 2021-09-02 DIAGNOSIS — S98131D Complete traumatic amputation of one right lesser toe, subsequent encounter: Secondary | ICD-10-CM | POA: Diagnosis not present

## 2021-09-02 DIAGNOSIS — M79674 Pain in right toe(s): Secondary | ICD-10-CM

## 2021-09-02 DIAGNOSIS — Z89421 Acquired absence of other right toe(s): Secondary | ICD-10-CM | POA: Diagnosis not present

## 2021-09-02 DIAGNOSIS — M86171 Other acute osteomyelitis, right ankle and foot: Secondary | ICD-10-CM

## 2021-09-02 DIAGNOSIS — M19071 Primary osteoarthritis, right ankle and foot: Secondary | ICD-10-CM | POA: Diagnosis not present

## 2021-09-02 DIAGNOSIS — R600 Localized edema: Secondary | ICD-10-CM | POA: Diagnosis not present

## 2021-09-02 DIAGNOSIS — Z9889 Other specified postprocedural states: Secondary | ICD-10-CM

## 2021-09-02 IMAGING — DX DG FOOT COMPLETE 3+V*R*
3 series · 3 of 3 positions shown · non-contrast
Comparison: Radiograph [DATE], additional priors. MRI
[DATE]

CLINICAL DATA: Pain.  Right 2nd toe amputation followup.

EXAM:
RIGHT FOOT COMPLETE - 3+ VIEW

[foot ap wb]
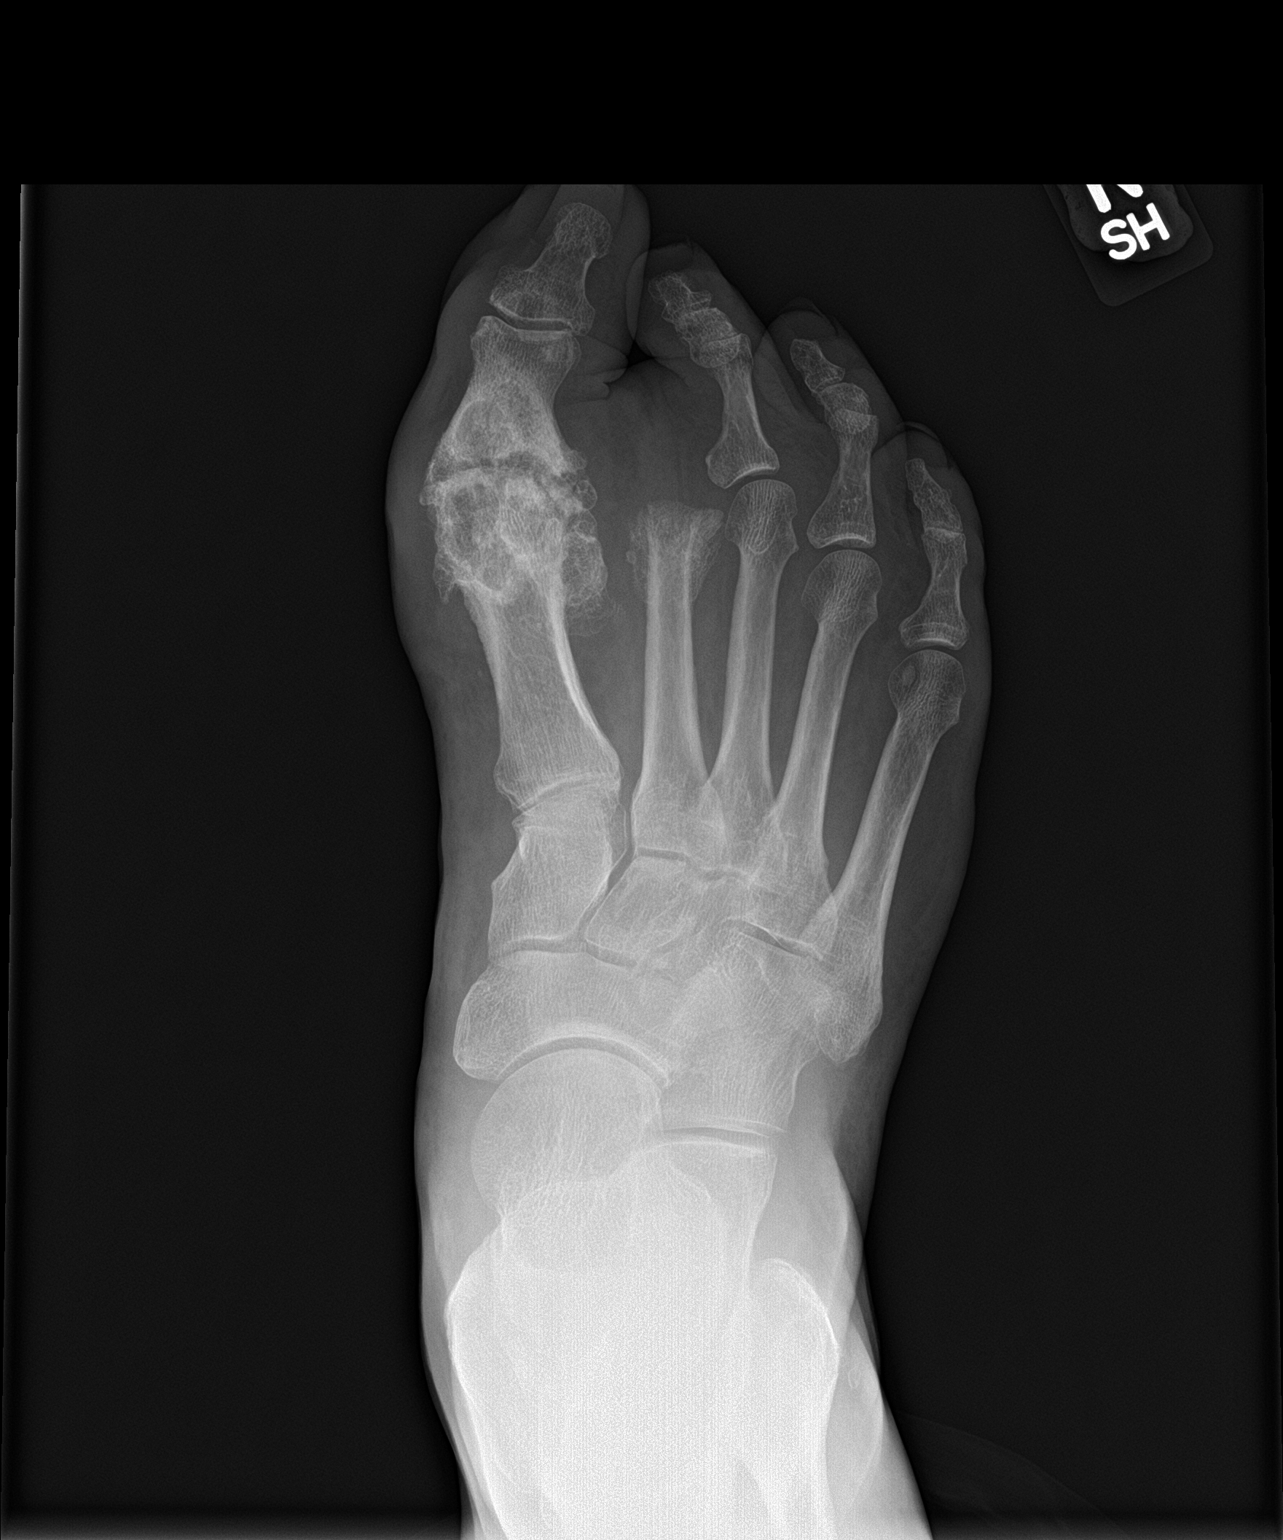

[foot obl wb]
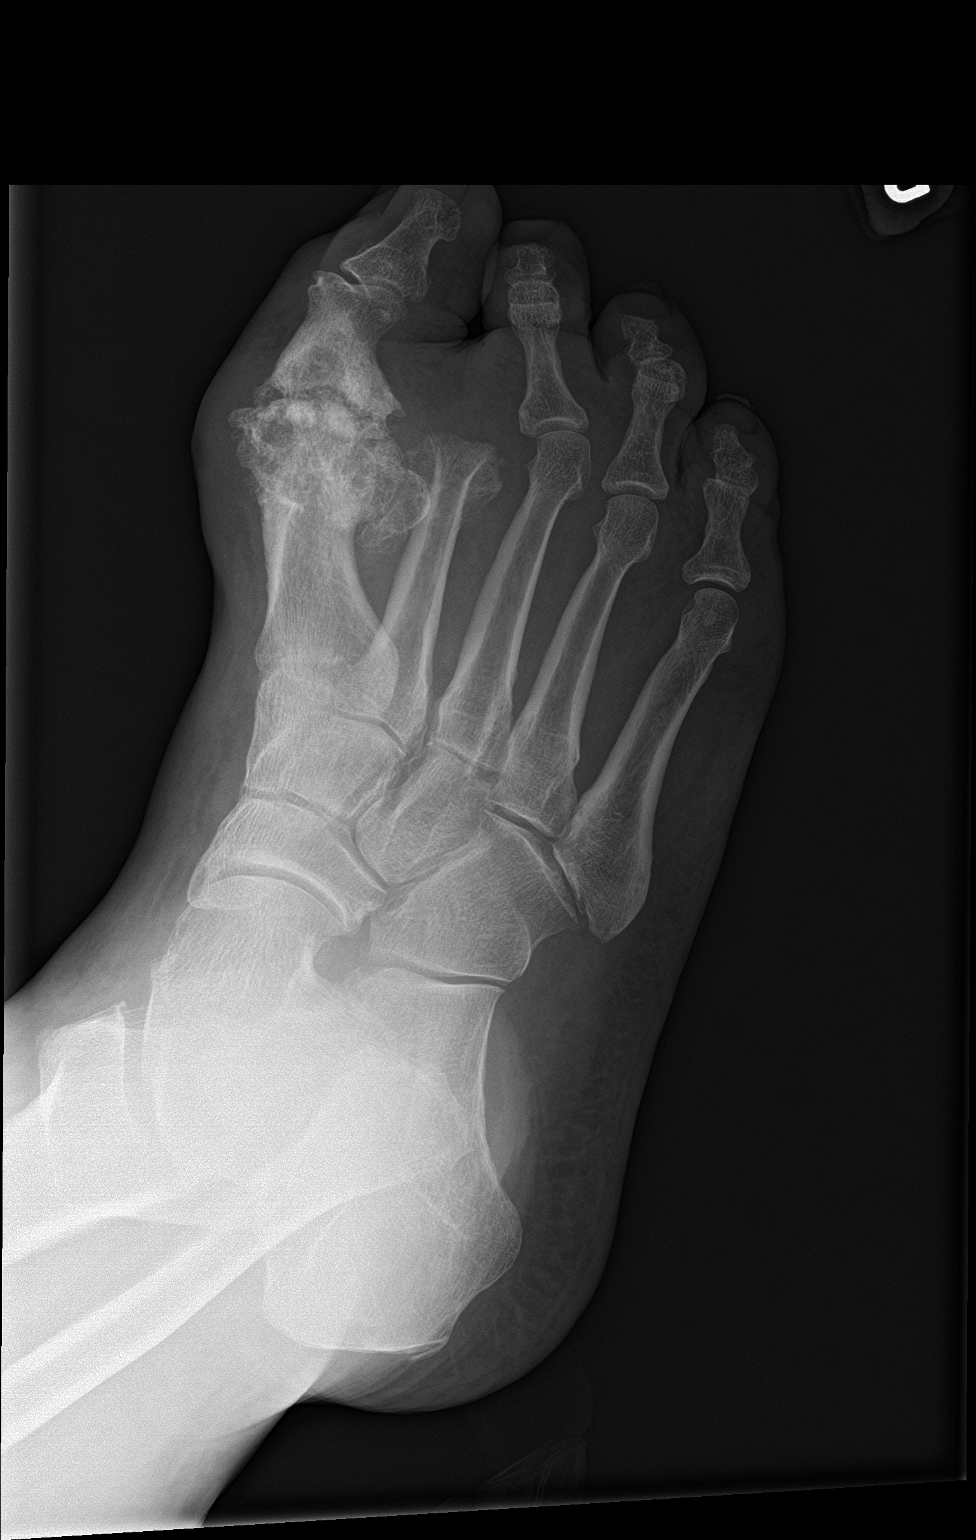

[foot lat wb]
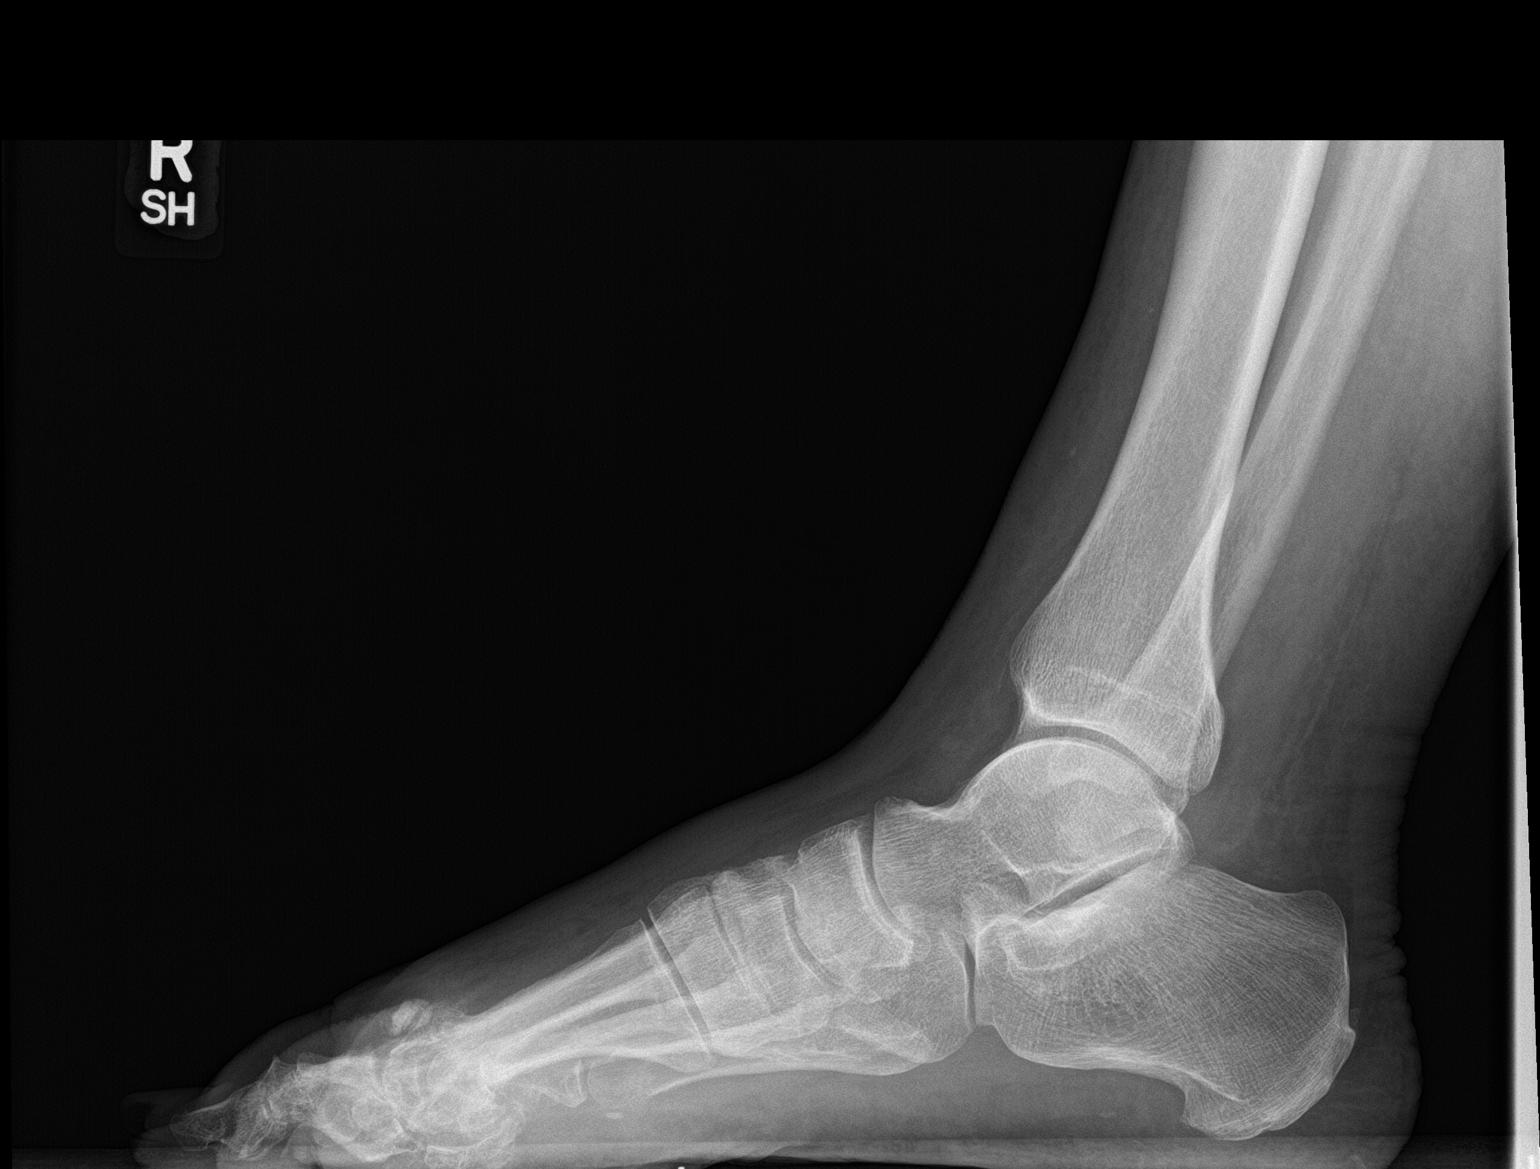

[3 of 3 positions shown; findings below may reference images not displayed]

FINDINGS: Resection of the second toe and metatarsal head. There is some
chronic periosteal thickening at the amputation site but no bony
destruction. Advanced arthropathy of the great toe
metatarsal-phalangeal joint without significant radiographic change.
There are no new osseous findings. No fracture. No new areas of bone
destruction no soft tissue gas or radiopaque foreign body. Mild soft
tissue edema overlies the dorsum of the foot.
IMPRESSION: 1. Resection of the second toe and metatarsal head. No postoperative
complication.
2. Advanced arthropathy of the great toe metatarsal-phalangeal
joint.
3. Mild soft tissue edema overlies the dorsum of the foot.

## 2021-09-02 NOTE — Progress Notes (Signed)
?Subjective:  ?Patient ID: Miguel [name], male    DOB: 1950/08/28,  MRN: [id number] ? ?Chief Complaint  ?Patient presents with  ? Routine Post Op  ?  DOS: 05/16/20 ?Procedure: Right second digit amputation ?"I'm not having any problems"  ? ? ?DOS: 05/16/20 ?Procedure: Right second digit amputation ? ?71 y.o. male returns for POV#7.  Relates a lot of improvement in swelling. No pain. No issues recently and doing well.  ? ?Review of Systems: Negative except as noted in the HPI. Denies N/V/F/Ch. ? ?Past Medical History:  ?Diagnosis Date  ? Class 1 obesity 05/13/2021  ? ? ?Current Outpatient Medications:  ?  acetaminophen (TYLENOL) 325 MG tablet, Take 2 tablets (650 mg total) by mouth every 6 (six) hours as needed for mild pain (or Fever >/= 101)., Disp: , Rfl:  ?  B Complex Vitamins (VITAMIN B COMPLEX) TABS, Take 1 tablet by mouth daily., Disp: , Rfl:  ?  bisoprolol (ZEBETA) 5 MG tablet, Take 5 mg by mouth daily., Disp: , Rfl:  ?  Cholecalciferol (VITAMIN D-3) 125 MCG (5000 UT) TABS, Take 5,000 Units by mouth daily., Disp: , Rfl:  ?  dutasteride (AVODART) 0.5 MG capsule, Take 0.5 mg by mouth daily., Disp: , Rfl:  ?  gabapentin (NEURONTIN) 300 MG capsule, Take 600 mg by mouth daily in the afternoon., Disp: , Rfl:  ?  gabapentin (NEURONTIN) 600 MG tablet, Take 600 mg by mouth daily., Disp: , Rfl:  ?  ivermectin (STROMECTOL) 3 MG TABS tablet, SMARTSIG:7 Tablet(s) By Mouth Once a Week, Disp: , Rfl:  ?  losartan-hydrochlorothiazide (HYZAAR) 100-12.5 MG tablet, Take 1 tablet by mouth daily., Disp: , Rfl:  ?  metFORMIN (GLUCOPHAGE-XR) 500 MG 24 hr tablet, Take 500 mg by mouth at bedtime., Disp: , Rfl:  ?  naproxen (NAPROSYN) 500 MG tablet, Take 500-1,000 mg by mouth daily as needed for moderate pain., Disp: , Rfl:  ?  Quercetin 500 MG CAPS, Take 500 mg by mouth daily as needed (for allergies)., Disp: , Rfl:  ?  rosuvastatin (CRESTOR) 20 MG tablet, Take 20 mg by mouth every evening., Disp: , Rfl:  ?   sulfamethoxazole-trimethoprim (BACTRIM DS) 800-160 MG tablet, Take 1 tablet by mouth 2 (two) times daily., Disp: , Rfl:  ?  Ubiquinol 100 MG CAPS, Take 100 capsules by mouth 2 (two) times daily., Disp: , Rfl:  ? ?Social History  ? ?Tobacco Use  ?Smoking Status Never  ?Smokeless Tobacco Never  ? ? ?Allergies  ?Allergen Reactions  ? Fenofibrate Other (See Comments)  ?  Caused gallstones  ? Gemfibrozil Other (See Comments)  ?  Severe muscle pain  ? Simvastatin Other (See Comments)  ?  Severe muscle pain  ? ?Objective:  ?There were no vitals filed for this visit. ?There is no height or weight on file to calculate BMI. ?Constitutional Well developed. ?Well nourished.  ?Vascular Foot warm and well perfused. ?Capillary refill normal to all digits.   ?Neurologic Normal speech. ?Oriented to person, place, and time. ?Epicritic sensation to light touch grossly present bilaterally.  ?Dermatologic Skin healing well with No erythema or edema noted.  No erythema today.  No areas of dehiscence.  Scaling and xerosis noted to the circumfernetial foot.   ?Orthopedic: Tenderness to palpation noted about the surgical site.  ?MRI Right foot  ?1.  Status post transmetatarsal amputation of the second ray at the level of the metatarsal neck. Acute osteomyelitis of the distal end of the remnant second metatarsal.  ?  2.  Complicated peri-osseous collection at the distal end of the remnant second metatarsal is concerning for abscess/phlegmon.  ?3.  Likely early acute osteomyelitis of the medial third metatarsal head adjacent to the second metatarsal infection.  ?4.  Severe osteoarthritis of the first MTP joint with bulky circumferential osteophytes.  ?5.  Mild tenosynovitis of the flexor hallucis longus is favored mechanical and related to the first MTP osteoarthritis. ?Assessment:  ? ?1. Acute osteomyelitis of toe of right foot ([place])   ?2. Status post surgery   ? ? ? ? ?Plan:  ?Patient was evaluated and treated and all questions  answered. ? ?S/p foot surgery right ?-XR: reviewed and second metatarsal with callus formation distally. No signs of osseous erosion.  ?-Right foot has continued to do well. No pain swelling or erythema. No concerns for residual infection at this time. Will defer any interventions.  ?-WB Status: WBAT in regular shoe. ?-No need for further antibiotics. Patient doing well.  ?-Return in three months for rfc. Advised patient to return sooner if any concerns or worsening of the feet.  ? ?No follow-ups on file.  ? ?

## 2021-09-14 DIAGNOSIS — L821 Other seborrheic keratosis: Secondary | ICD-10-CM | POA: Diagnosis not present

## 2021-09-14 DIAGNOSIS — D485 Neoplasm of uncertain behavior of skin: Secondary | ICD-10-CM | POA: Diagnosis not present

## 2021-09-14 DIAGNOSIS — Z85820 Personal history of malignant melanoma of skin: Secondary | ICD-10-CM | POA: Diagnosis not present

## 2021-09-21 ENCOUNTER — Other Ambulatory Visit: Payer: Self-pay | Admitting: *Deleted

## 2021-09-21 DIAGNOSIS — M869 Osteomyelitis, unspecified: Secondary | ICD-10-CM

## 2021-10-03 ENCOUNTER — Ambulatory Visit (INDEPENDENT_AMBULATORY_CARE_PROVIDER_SITE_OTHER): Payer: Medicare (Managed Care) | Admitting: Surgery

## 2021-10-03 ENCOUNTER — Ambulatory Visit (HOSPITAL_COMMUNITY)
Admission: RE | Admit: 2021-10-03 | Discharge: 2021-10-03 | Disposition: A | Discharge status: Home / Self Care | Payer: Medicare (Managed Care) | Source: Ambulatory Visit | Attending: Surgery | Admitting: Surgery

## 2021-10-03 ENCOUNTER — Encounter: Payer: Self-pay | Admitting: Surgery

## 2021-10-03 VITALS — BP 95/65 | HR 58 | Temp 98.0°F | Resp 20 | Ht 70.0 in | Wt 239.0 lb

## 2021-10-03 DIAGNOSIS — M869 Osteomyelitis, unspecified: Secondary | ICD-10-CM | POA: Diagnosis not present

## 2021-10-03 DIAGNOSIS — I10 Essential (primary) hypertension: Secondary | ICD-10-CM | POA: Insufficient documentation

## 2021-10-03 DIAGNOSIS — L97909 Non-pressure chronic ulcer of unspecified part of unspecified lower leg with unspecified severity: Secondary | ICD-10-CM | POA: Insufficient documentation

## 2021-10-03 DIAGNOSIS — Z89421 Acquired absence of other right toe(s): Secondary | ICD-10-CM | POA: Insufficient documentation

## 2021-10-03 DIAGNOSIS — I70269 Atherosclerosis of native arteries of extremities with gangrene, unspecified extremity: Secondary | ICD-10-CM | POA: Insufficient documentation

## 2021-10-03 NOTE — Progress Notes (Signed)
[facility] [place]  Patient name: Miguel [name] MRN: [id number] DOB: 27-Dec-1950 Sex: male   REQUESTING PROVIDER:    Dr. Leonides Schanz   REASON FOR CONSULT:    Right foot osteo  HISTORY OF PRESENT ILLNESS:   Miguel [name] is a 71 y.o. male, who is status post amputation of the right second toe several months ago for infection.  This has healed.  He is here today for vascular evaluation.  He denies claudication symptoms.  He is a non-smoker.  PAST MEDICAL HISTORY    Past Medical History:  Diagnosis Date   Class 1 obesity 05/13/2021     FAMILY HISTORY   Family History  Problem Relation Age of Onset   Heart failure Mother    Prostate cancer Father    Stroke Brother    CAD Brother    CAD Brother     SOCIAL HISTORY:   Social History   Socioeconomic History   Marital status: Single    Spouse name: Not on file   Number of children: Not on file   Years of education: Not on file   Highest education level: Not on file  Occupational History   Not on file  Tobacco Use   Smoking status: Never   Smokeless tobacco: Never  Vaping Use   Vaping Use: Never used  Substance and Sexual Activity   Alcohol use: Yes   Drug use: Never   Sexual activity: Not on file  Other Topics Concern   Not on file  Social History Narrative   Not on file   Social Determinants of Health   Financial Resource Strain: Not on file  Food Insecurity: Not on file  Transportation Needs: Not on file  Physical Activity: Not on file  Stress: Not on file  Social Connections: Not on file  Intimate Partner Violence: Not on file    ALLERGIES:    Allergies  Allergen Reactions   Fenofibrate Other (See Comments)    Caused gallstones   Gemfibrozil Other (See Comments)    Severe muscle pain   Simvastatin Other (See Comments)    Severe muscle pain    CURRENT MEDICATIONS:    Current Outpatient Medications  Medication Sig Dispense Refill    acetaminophen (TYLENOL) 325 MG tablet Take 2 tablets (650 mg total) by mouth every 6 (six) hours as needed for mild pain (or Fever >/= 101).     B Complex Vitamins (VITAMIN B COMPLEX) TABS Take 1 tablet by mouth daily.     bisoprolol (ZEBETA) 5 MG tablet Take 5 mg by mouth daily.     Cholecalciferol (VITAMIN D-3) 125 MCG (5000 UT) TABS Take 5,000 Units by mouth daily.     dutasteride (AVODART) 0.5 MG capsule Take 0.5 mg by mouth daily.     gabapentin (NEURONTIN) 300 MG capsule Take 600 mg by mouth daily in the afternoon.     gabapentin (NEURONTIN) 600 MG tablet Take 600 mg by mouth daily.     ivermectin (STROMECTOL) 3 MG TABS tablet SMARTSIG:7 Tablet(s) By Mouth Once a Week     losartan-hydrochlorothiazide (HYZAAR) 100-12.5 MG tablet Take 1 tablet by mouth daily.     metFORMIN (GLUCOPHAGE-XR) 500 MG 24 hr tablet Take 500 mg by mouth at bedtime.     naproxen (NAPROSYN) 500 MG tablet Take 500-1,000 mg by mouth daily as needed for moderate pain.     Quercetin 500 MG CAPS Take 500 mg by mouth daily as needed (for allergies).  rosuvastatin (CRESTOR) 20 MG tablet Take 20 mg by mouth every evening.     sulfamethoxazole-trimethoprim (BACTRIM DS) 800-160 MG tablet Take 1 tablet by mouth 2 (two) times daily.     Ubiquinol 100 MG CAPS Take 100 capsules by mouth 2 (two) times daily.     No current facility-administered medications for this visit.    REVIEW OF SYSTEMS:   '[X]'$  denotes positive finding, '[ ]'$  denotes negative finding Cardiac  Comments:  Chest pain or chest pressure:    Shortness of breath upon exertion:    Short of breath when lying flat:    Irregular heart rhythm:        Vascular    Pain in calf, thigh, or hip brought on by ambulation:    Pain in feet at night that wakes you up from your sleep:     Blood clot in your veins:    Leg swelling:         Pulmonary    Oxygen at home:    Productive cough:     Wheezing:         Neurologic    Sudden weakness in arms or legs:      Sudden numbness in arms or legs:     Sudden onset of difficulty speaking or slurred speech:    Temporary loss of vision in one eye:     Problems with dizziness:         Gastrointestinal    Blood in stool:      Vomited blood:         Genitourinary    Burning when urinating:     Blood in urine:        Psychiatric    Major depression:         Hematologic    Bleeding problems:    Problems with blood clotting too easily:        Skin    Rashes or ulcers:        Constitutional    Fever or chills:     PHYSICAL EXAM:   Vitals:   10/03/21 1138  BP: 95/65  Pulse: (!) 58  Resp: 20  Temp: 98 F (36.7 C)  SpO2: 93%  Weight: 239 lb (108.4 kg)  Height: '5\' 10"'$  (1.778 m)    GENERAL: The patient is a well-nourished male, in no acute distress. The vital signs are documented above. CARDIAC: There is a regular rate and rhythm.  VASCULAR: Palpable pedal pulses bilaterally PULMONARY: Nonlabored respirations ABDOMEN: Soft and non-tender with normal pitched bowel sounds.  MUSCULOSKELETAL: Healed right second toe amputation NEUROLOGIC: No focal weakness or paresthesias are detected. SKIN: There are no ulcers or rashes noted. PSYCHIATRIC: The patient has a normal affect.  STUDIES:   I have reviewed the following: +-------+-----------+-----------+------------+------------+  ABI/TBIToday's ABIToday's TBIPrevious ABIPrevious TBI  +-------+-----------+-----------+------------+------------+  Right  1.47       0.94       1.25                      +-------+-----------+-----------+------------+------------+  Left   1.49       0.84       1.17                      +-------+-----------+-----------+------------+------------+ Right toe pressure = 101 Left toe pressure = 91 All waveforms are triphasic  ASSESSMENT and PLAN   Status post right second toe amputation: The patient has palpable pedal pulses  and normal ankle-brachial indices with normal toe pressures although  they are slightly diminished.  All waveforms are triphasic.  The patient has adequate blood flow at this time.  No further vascular work-up necessary.   Leia Alf, MD, FACS [facility] [phone] Pager [phone]

## 2021-10-12 DIAGNOSIS — S30860A Insect bite (nonvenomous) of lower back and pelvis, initial encounter: Secondary | ICD-10-CM | POA: Diagnosis not present

## 2021-10-12 DIAGNOSIS — L821 Other seborrheic keratosis: Secondary | ICD-10-CM | POA: Diagnosis not present

## 2021-10-18 ENCOUNTER — Other Ambulatory Visit: Payer: Self-pay | Admitting: *Deleted

## 2021-10-18 NOTE — Patient Outreach (Signed)
[facility]) Care Management  10/18/2021  JEMUEL LAURSEN 12-28-1950 [id number]   Received referral for Care Management from Insurance plan. Assigned patient to [name], RN Care Coordinator for follow up.   [place] Management Assistant [phone]

## 2021-10-19 NOTE — Patient Outreach (Signed)
[name]) Care Management  10/19/2021  Miguel [name] 11/23/1950 [id number]   Referral received however member has PCP with external care management services.  Case closed.  [name], RN, MSN, [place] Manager [phone]

## 2021-10-23 DIAGNOSIS — H60501 Unspecified acute noninfective otitis externa, right ear: Secondary | ICD-10-CM | POA: Diagnosis not present

## 2021-12-01 ENCOUNTER — Encounter: Payer: Self-pay | Admitting: Podiatry

## 2021-12-01 ENCOUNTER — Ambulatory Visit (INDEPENDENT_AMBULATORY_CARE_PROVIDER_SITE_OTHER): Payer: Medicare (Managed Care) | Admitting: Podiatry

## 2021-12-01 DIAGNOSIS — M79674 Pain in right toe(s): Secondary | ICD-10-CM | POA: Diagnosis not present

## 2021-12-01 DIAGNOSIS — B351 Tinea unguium: Secondary | ICD-10-CM | POA: Diagnosis not present

## 2021-12-01 DIAGNOSIS — Z89431 Acquired absence of right foot: Secondary | ICD-10-CM

## 2021-12-01 DIAGNOSIS — G629 Polyneuropathy, unspecified: Secondary | ICD-10-CM

## 2021-12-01 DIAGNOSIS — M79675 Pain in left toe(s): Secondary | ICD-10-CM

## 2021-12-01 NOTE — Progress Notes (Signed)
  Subjective:  Patient ID: Miguel [name], male    DOB: 1951/02/02,   MRN: [id number]  No chief complaint on file.   71 y.o. male presents for concern of thickened elongated and painful nails that are difficult to trim. Requesting to have them trimmed today. Relates burning and tingling in their feet. Patient is diabetic and last A1c was  Lab Results  Component Value Date   HGBA1C 6.2 (H) 05/15/2021   .  History of right second digit amputation that has continued to do well. No new concerns today.   PCP:  Cari Caraway, MD    . Denies any other pedal complaints. Denies n/v/f/c.   Past Medical History:  Diagnosis Date   Class 1 obesity 05/13/2021    Objective:  Physical Exam: Vascular: DP/PT pulses 2/4 bilateral. CFT <3 seconds. Absent hair growth on digits. Edema noted to bilateral lower extremities. Xerosis noted bilaterally.  Skin. No lacerations or abrasions bilateral feet. Nails 1-5 bilateral  are thickened discolored and elongated with subungual debris.  Musculoskeletal: MMT 5/5 bilateral lower extremities in DF, PF, Inversion and Eversion. Deceased ROM in DF of ankle joint. Amputation of right second digit.  Neurological: Sensation intact to light touch. Protective sensation diminished bilateral.    Assessment:   1. Pain due to onychomycosis of toenails of both feet   2. History of amputation of right foot ([place])   3. Neuropathy      Plan:  Patient was evaluated and treated and all questions answered. -Discussed and educated patient on foot care, especially with  regards to the vascular, neurological and musculoskeletal systems.  -Amputation site continuing to do well.  -Discussed supportive shoes at all times and checking feet regularly.  -Mechanically debrided all nails x9 bilateral using sterile nail nipper and filed with dremel without incident  -Answered all patient questions -Patient to return  in 3 months for at risk foot care -Patient advised to call the  office if any problems or questions arise in the meantime.   Lorenda Peck, DPM

## 2022-01-17 DIAGNOSIS — L299 Pruritus, unspecified: Secondary | ICD-10-CM | POA: Diagnosis not present

## 2022-02-09 DIAGNOSIS — R7309 Other abnormal glucose: Secondary | ICD-10-CM | POA: Diagnosis not present

## 2022-02-09 DIAGNOSIS — R809 Proteinuria, unspecified: Secondary | ICD-10-CM | POA: Diagnosis not present

## 2022-02-13 DIAGNOSIS — Z0000 Encounter for general adult medical examination without abnormal findings: Secondary | ICD-10-CM | POA: Diagnosis not present

## 2022-02-13 DIAGNOSIS — Z6834 Body mass index (BMI) 34.0-34.9, adult: Secondary | ICD-10-CM | POA: Diagnosis not present

## 2022-02-13 DIAGNOSIS — Z1389 Encounter for screening for other disorder: Secondary | ICD-10-CM | POA: Diagnosis not present

## 2022-02-15 DIAGNOSIS — R809 Proteinuria, unspecified: Secondary | ICD-10-CM | POA: Diagnosis not present

## 2022-02-15 DIAGNOSIS — E669 Obesity, unspecified: Secondary | ICD-10-CM | POA: Diagnosis not present

## 2022-02-15 DIAGNOSIS — R519 Headache, unspecified: Secondary | ICD-10-CM | POA: Diagnosis not present

## 2022-02-15 DIAGNOSIS — N400 Enlarged prostate without lower urinary tract symptoms: Secondary | ICD-10-CM | POA: Diagnosis not present

## 2022-02-15 DIAGNOSIS — R7303 Prediabetes: Secondary | ICD-10-CM | POA: Diagnosis not present

## 2022-02-15 DIAGNOSIS — Z6834 Body mass index (BMI) 34.0-34.9, adult: Secondary | ICD-10-CM | POA: Diagnosis not present

## 2022-02-15 DIAGNOSIS — E785 Hyperlipidemia, unspecified: Secondary | ICD-10-CM | POA: Diagnosis not present

## 2022-02-15 DIAGNOSIS — Z23 Encounter for immunization: Secondary | ICD-10-CM | POA: Diagnosis not present

## 2022-02-15 DIAGNOSIS — G629 Polyneuropathy, unspecified: Secondary | ICD-10-CM | POA: Diagnosis not present

## 2022-02-15 DIAGNOSIS — I10 Essential (primary) hypertension: Secondary | ICD-10-CM | POA: Diagnosis not present

## 2022-03-03 ENCOUNTER — Encounter: Payer: Self-pay | Admitting: Podiatrist

## 2022-03-03 ENCOUNTER — Ambulatory Visit (INDEPENDENT_AMBULATORY_CARE_PROVIDER_SITE_OTHER): Payer: Medicare (Managed Care) | Admitting: Podiatrist

## 2022-03-03 DIAGNOSIS — G629 Polyneuropathy, unspecified: Secondary | ICD-10-CM | POA: Diagnosis not present

## 2022-03-03 DIAGNOSIS — L97511 Non-pressure chronic ulcer of other part of right foot limited to breakdown of skin: Secondary | ICD-10-CM

## 2022-03-03 DIAGNOSIS — S90421A Blister (nonthermal), right great toe, initial encounter: Secondary | ICD-10-CM

## 2022-03-03 NOTE — Progress Notes (Signed)
Chief Complaint  Patient presents with   Blister    Blood blister pop this morning     HPI: Patient is 71 y.o. male who presents today for a blister that popped this morning on the right great toe.    Allergies  Allergen Reactions   Fenofibrate Other (See Comments)    Caused gallstones   Gemfibrozil Other (See Comments)    Severe muscle pain   Simvastatin Other (See Comments)    Severe muscle pain    Review of systems is negative except as noted in the HPI.  Denies nausea/ vomiting/ fevers/ chills or night sweats.   Denies difficulty breathing, denies calf pain or tenderness  Physical Exam  Physical Exam:  Vascular: DP/PT pulses 2/4 right . CFT <3 seconds. Absent hair growth on digits.   Flip flop tan is noted right.    Skin. Blister has just popped at the base of the right great toe.  Intact overlying skin is present with no redness, no swelling, no malodor, no drainage noted.  Ulceration at the distal lateral tip of the right hallux noted.  Superficial.  Right hallux nail also elongated and mycotic in appearance.   Musculoskeletal: MMT 5/5 bilateral lower extremities in DF, PF, Inversion and Eversion. Deceased ROM in DF of ankle joint. Amputation of right second digit.  Neurological: Sensation intact to light touch. Protective sensation diminished bilateral.    Assessment:   ICD-10-CM   1. Blister of great toe of right foot, initial encounter  S90.421A     2. Skin ulcer of great toe, limited to breakdown of skin, right ([place])  L97.511     3. Neuropathy  G62.9        Plan: Exam findings and treatment recommendations are discussed.  I pared the skin of the ulcer of the distal hallux with a 15 blade and applied antibiotic ointment and a bandage.  He was given instructions to cleanse the wound daily and apply mupirocin ointment to the toe.  A The blister looks stable. It has popped and the skin remaining appears non infected- recommended he paint with betadine daily.  He  will return for a recheck of the ulcer in 3-4 weeks.  And will call if any signs of infection arise.

## 2022-03-03 NOTE — Patient Instructions (Addendum)
Apply betadine to the blister-  the skin will dry up and fall off  Apply mupirocin ointment (bactroban) to the tip of the toe and cover-  wash with soap and water or wound wash daily.  Call if you seen any redness, swelling or drainage.

## 2022-03-15 DIAGNOSIS — D485 Neoplasm of uncertain behavior of skin: Secondary | ICD-10-CM | POA: Diagnosis not present

## 2022-03-15 DIAGNOSIS — Z85820 Personal history of malignant melanoma of skin: Secondary | ICD-10-CM | POA: Diagnosis not present

## 2022-03-15 DIAGNOSIS — B351 Tinea unguium: Secondary | ICD-10-CM | POA: Diagnosis not present

## 2022-03-29 DIAGNOSIS — D485 Neoplasm of uncertain behavior of skin: Secondary | ICD-10-CM | POA: Diagnosis not present

## 2022-04-13 ENCOUNTER — Ambulatory Visit (INDEPENDENT_AMBULATORY_CARE_PROVIDER_SITE_OTHER): Payer: Medicare (Managed Care) | Admitting: Podiatrist

## 2022-04-13 ENCOUNTER — Encounter: Payer: Self-pay | Admitting: Podiatrist

## 2022-04-13 DIAGNOSIS — S90421D Blister (nonthermal), right great toe, subsequent encounter: Secondary | ICD-10-CM

## 2022-04-13 NOTE — Progress Notes (Signed)
Chief Complaint  Patient presents with   Blister    Right foot blister has now healed     HPI: Patient is 71 y.o. male who presents today for follow-up of blister at the base of the right great toe.  He relates overall he is doing well.  The blister went on to heal and he is doing well.   Allergies  Allergen Reactions   Fenofibrate Other (See Comments)    Caused gallstones   Gemfibrozil Other (See Comments)    Severe muscle pain   Simvastatin Other (See Comments)    Severe muscle pain    Review of systems is negative except as noted in the HPI.  Denies nausea/ vomiting/ fevers/ chills or night sweats.   Denies difficulty breathing, denies calf pain or tenderness  Physical Exam  Physical Exam:  Vascular: DP/PT pulses 2/4 right . CFT <3 seconds. Absent hair growth on digits.       Skin.  Healing of the blister is noted base of the right great toe.  Intact healed integument is noted.  The superficial ulceration at the distal lateral tip of the right hallux has also healed.. Right hallux nail also elongated and mycotic in appearance and he relates that his dermatologist just put him on Lamisil to help with this..   Musculoskeletal: MMT 5/5 bilateral lower extremities in DF, PF, Inversion and Eversion. Deceased ROM in DF of ankle joint. Amputation of right second digit.   Neurological: Sensation intact to light touch. Protective sensation diminished bilateral.    Assessment:    ICD-10-CM   1. Blister (nonthermal), right great toe, subsequent encounter  S90.421D         Plan: Exam findings and treatment recommendations are discussed.  The blister along the base of the right great toe has resolved and the ulcer at the tip of the toe has also resolved.  Recommended at risk footcare every 3 months and that appointment will be made for him.  Any problems or concerns arise prior to that visit he will call.

## 2022-04-28 DIAGNOSIS — M65342 Trigger finger, left ring finger: Secondary | ICD-10-CM | POA: Diagnosis not present

## 2022-04-28 DIAGNOSIS — Z6834 Body mass index (BMI) 34.0-34.9, adult: Secondary | ICD-10-CM | POA: Diagnosis not present

## 2022-05-11 DIAGNOSIS — M65342 Trigger finger, left ring finger: Secondary | ICD-10-CM | POA: Diagnosis not present

## 2022-05-18 ENCOUNTER — Ambulatory Visit (INDEPENDENT_AMBULATORY_CARE_PROVIDER_SITE_OTHER): Payer: PPO | Admitting: Podiatry

## 2022-05-18 ENCOUNTER — Encounter: Payer: Self-pay | Admitting: Podiatry

## 2022-05-18 DIAGNOSIS — S90421D Blister (nonthermal), right great toe, subsequent encounter: Secondary | ICD-10-CM | POA: Diagnosis not present

## 2022-05-18 DIAGNOSIS — L97511 Non-pressure chronic ulcer of other part of right foot limited to breakdown of skin: Secondary | ICD-10-CM

## 2022-05-18 NOTE — Progress Notes (Signed)
Chief Complaint  Patient presents with   Foot Problem    Right great toe, split     HPI: Patient is 72 y.o. male who presents today for follow-up of blister at the base of the right great toe. It had healed up but recently noticed some blood on his floor and appears the area had split open again.    Allergies  Allergen Reactions   Fenofibrate Other (See Comments)    Caused gallstones   Gemfibrozil Other (See Comments)    Severe muscle pain   Simvastatin Other (See Comments)    Severe muscle pain    Review of systems is negative except as noted in the HPI.  Denies nausea/ vomiting/ fevers/ chills or night sweats.   Denies difficulty breathing, denies calf pain or tenderness  Physical Exam  Physical Exam:  Vascular: DP/PT pulses 2/4 right . CFT <3 seconds. Absent hair growth on digits.       Skin.    Ulceration noted to tip of right hallux. Granular base measuring about 0.1 cm x 3 cm x 0.1 cm . No erythema edema or purulence noted.  Right hallux nail also elongated and mycotic in appearance and he relates that his dermatologist just put him on Lamisil to help with this..   Musculoskeletal: MMT 5/5 bilateral lower extremities in DF, PF, Inversion and Eversion. Deceased ROM in DF of ankle joint. Amputation of right second digit.   Neurological: Sensation intact to light touch. Protective sensation diminished bilateral.    Assessment:    ICD-10-CM   1. Skin ulcer of great toe, limited to breakdown of skin, right ([place])  L97.511          Plan: Ulcer distal right hallux limited to breakdown of skin -Debridement as below. -Dressed with betadine, DSD. Continue mupirocin dressings at home.  -Off-loading with surgical shoe. -No abx indicated.  -Discussed glucose control and proper protein-rich diet.  -Discussed if any worsening redness, pain, fever or chills to call or may need to report to the emergency room. Patient expressed understanding.   Procedure: Excisional  Debridement of Wound Rationale: Removal of non-viable soft tissue from the wound to promote healing.  Anesthesia: none Pre-Debridement Wound Measurements: Overlying hyperkeratosis   Post-Debridement Wound Measurements: 0.1 cm x 3 cm x 0.1 cm  Type of Debridement: Sharp Excisional Tissue Removed: Non-viable soft tissue Depth of Debridement: subcutaneous tissue. Technique: Sharp excisional debridement to bleeding, viable wound base.  Dressing: Dry, sterile, compression dressing. Disposition: Patient tolerated procedure well. Patient to return in 2 week for follow-up.  Return in about 2 weeks (around 06/01/2022) for wound check.

## 2022-06-01 ENCOUNTER — Ambulatory Visit (INDEPENDENT_AMBULATORY_CARE_PROVIDER_SITE_OTHER): Payer: PPO | Admitting: Podiatry

## 2022-06-01 ENCOUNTER — Encounter: Payer: Self-pay | Admitting: Podiatry

## 2022-06-01 DIAGNOSIS — G629 Polyneuropathy, unspecified: Secondary | ICD-10-CM | POA: Diagnosis not present

## 2022-06-01 DIAGNOSIS — M79675 Pain in left toe(s): Secondary | ICD-10-CM | POA: Diagnosis not present

## 2022-06-01 DIAGNOSIS — M79674 Pain in right toe(s): Secondary | ICD-10-CM

## 2022-06-01 DIAGNOSIS — E1142 Type 2 diabetes mellitus with diabetic polyneuropathy: Secondary | ICD-10-CM | POA: Diagnosis not present

## 2022-06-01 DIAGNOSIS — L97511 Non-pressure chronic ulcer of other part of right foot limited to breakdown of skin: Secondary | ICD-10-CM

## 2022-06-01 DIAGNOSIS — B351 Tinea unguium: Secondary | ICD-10-CM

## 2022-06-01 NOTE — Progress Notes (Signed)
Chief Complaint  Patient presents with   Wound Check    Blister right hallux    Nail Problem    Routine Foot Care      HPI: Patient is 72 y.o. male who presents today for follow-up of blister at the base of the right great toe. Relates it is doing better.  Also with concern of thickened elongated and painful nails that are difficult to trim. Requesting to have them trimmed today. Relates burning and tingling in their feet. Patient is diabetic and last A1c was  Lab Results  Component Value Date   HGBA1C 6.2 (H) 05/15/2021   .  He is on metformin and has neuropathy   PCP:  Cari Caraway, MD      Allergies  Allergen Reactions   Fenofibrate Other (See Comments)    Caused gallstones   Gemfibrozil Other (See Comments)    Severe muscle pain   Simvastatin Other (See Comments)    Severe muscle pain    Review of systems is negative except as noted in the HPI.  Denies nausea/ vomiting/ fevers/ chills or night sweats.   Denies difficulty breathing, denies calf pain or tenderness  Physical Exam  Physical Exam:  Vascular: DP/PT pulses 2/4 right . CFT <3 seconds. Absent hair growth on digits.       Skin.    Ulceration noted to tip of right hallux has healed. . No erythema edema or purulence noted. Nails 1-5 bilateral are thickened and elongated with subungual debris.    Musculoskeletal: MMT 5/5 bilateral lower extremities in DF, PF, Inversion and Eversion. Deceased ROM in DF of ankle joint. Amputation of right second digit.   Neurological: Sensation intact to light touch. Protective sensation diminished bilateral.    Assessment:    ICD-10-CM   1. Skin ulcer of great toe, limited to breakdown of skin, right ([place])  L97.511     2. Pain due to onychomycosis of toenails of both feet  B35.1    M79.675    M79.674     3. Neuropathy  G62.9     4. DM type 2 with diabetic peripheral neuropathy ([facility])  E11.42          Plan: Ulcer distal right hallux-healed.  -Debridement of  hyperkeratotic tissue with dremel.  -No dressing necessary.  --Discussed and educated patient on diabetic foot care, especially with  regards to the vascular, neurological and musculoskeletal systems.  -Stressed the importance of good glycemic control and the detriment of not  controlling glucose levels in relation to the foot. -Discussed supportive shoes at all times and checking feet regularly.  -Mechanically debrided all nails 1-5 bilateral using sterile nail nipper and filed with dremel without incident  -Answered all patient questions -Patient to return  in 3 months for at risk foot care -Patient advised to call the office if any problems or questions arise in the meantime.     No follow-ups on file.

## 2022-06-14 DIAGNOSIS — D485 Neoplasm of uncertain behavior of skin: Secondary | ICD-10-CM | POA: Diagnosis not present

## 2022-06-14 DIAGNOSIS — L821 Other seborrheic keratosis: Secondary | ICD-10-CM | POA: Diagnosis not present

## 2022-06-14 DIAGNOSIS — Z85820 Personal history of malignant melanoma of skin: Secondary | ICD-10-CM | POA: Diagnosis not present

## 2022-06-14 DIAGNOSIS — B351 Tinea unguium: Secondary | ICD-10-CM | POA: Diagnosis not present

## 2022-06-15 ENCOUNTER — Ambulatory Visit: Payer: Medicare (Managed Care) | Admitting: Podiatry

## 2022-07-04 DIAGNOSIS — M65342 Trigger finger, left ring finger: Secondary | ICD-10-CM | POA: Diagnosis not present

## 2022-07-06 DIAGNOSIS — T63483A Toxic effect of venom of other arthropod, assault, initial encounter: Secondary | ICD-10-CM | POA: Diagnosis not present

## 2022-08-10 DIAGNOSIS — E785 Hyperlipidemia, unspecified: Secondary | ICD-10-CM | POA: Diagnosis not present

## 2022-08-10 DIAGNOSIS — R7303 Prediabetes: Secondary | ICD-10-CM | POA: Diagnosis not present

## 2022-08-10 DIAGNOSIS — Z79899 Other long term (current) drug therapy: Secondary | ICD-10-CM | POA: Diagnosis not present

## 2022-08-10 DIAGNOSIS — N400 Enlarged prostate without lower urinary tract symptoms: Secondary | ICD-10-CM | POA: Diagnosis not present

## 2022-08-10 DIAGNOSIS — R7309 Other abnormal glucose: Secondary | ICD-10-CM | POA: Diagnosis not present

## 2022-08-10 DIAGNOSIS — R809 Proteinuria, unspecified: Secondary | ICD-10-CM | POA: Diagnosis not present

## 2022-08-17 DIAGNOSIS — E790 Hyperuricemia without signs of inflammatory arthritis and tophaceous disease: Secondary | ICD-10-CM | POA: Diagnosis not present

## 2022-08-17 DIAGNOSIS — Z89421 Acquired absence of other right toe(s): Secondary | ICD-10-CM | POA: Diagnosis not present

## 2022-08-17 DIAGNOSIS — Z23 Encounter for immunization: Secondary | ICD-10-CM | POA: Diagnosis not present

## 2022-08-17 DIAGNOSIS — E1142 Type 2 diabetes mellitus with diabetic polyneuropathy: Secondary | ICD-10-CM | POA: Diagnosis not present

## 2022-08-17 DIAGNOSIS — I10 Essential (primary) hypertension: Secondary | ICD-10-CM | POA: Diagnosis not present

## 2022-08-17 DIAGNOSIS — E1151 Type 2 diabetes mellitus with diabetic peripheral angiopathy without gangrene: Secondary | ICD-10-CM | POA: Diagnosis not present

## 2022-08-17 DIAGNOSIS — N400 Enlarged prostate without lower urinary tract symptoms: Secondary | ICD-10-CM | POA: Diagnosis not present

## 2022-08-17 DIAGNOSIS — E785 Hyperlipidemia, unspecified: Secondary | ICD-10-CM | POA: Diagnosis not present

## 2022-08-17 DIAGNOSIS — G4733 Obstructive sleep apnea (adult) (pediatric): Secondary | ICD-10-CM | POA: Diagnosis not present

## 2022-08-31 ENCOUNTER — Ambulatory Visit (INDEPENDENT_AMBULATORY_CARE_PROVIDER_SITE_OTHER): Payer: PPO | Admitting: Podiatry

## 2022-08-31 ENCOUNTER — Encounter: Payer: Self-pay | Admitting: Podiatry

## 2022-08-31 DIAGNOSIS — B351 Tinea unguium: Secondary | ICD-10-CM | POA: Diagnosis not present

## 2022-08-31 DIAGNOSIS — E1142 Type 2 diabetes mellitus with diabetic polyneuropathy: Secondary | ICD-10-CM | POA: Diagnosis not present

## 2022-08-31 DIAGNOSIS — M79675 Pain in left toe(s): Secondary | ICD-10-CM

## 2022-08-31 DIAGNOSIS — M79674 Pain in right toe(s): Secondary | ICD-10-CM | POA: Diagnosis not present

## 2022-08-31 NOTE — Progress Notes (Signed)
Chief Complaint  Patient presents with   Diabetes    Diabetic foot care, nail trim A1c-6.2. diabetic ulcer right hallux, patient denies any pain      HPI: Patient is 72 y.o. male who presents today with concern of thickened elongated and painful nails that are difficult to trim. Requesting to have them trimmed today. Relates burning and tingling in their feet. Patient is diabetic and last A1c was  Lab Results  Component Value Date   HGBA1C 6.2 (H) 05/15/2021   .  He is on metformin and has neuropathy   PCP:  Gweneth Dimitri, MD      Allergies  Allergen Reactions   Fenofibrate Other (See Comments)    Caused gallstones   Gemfibrozil Other (See Comments)    Severe muscle pain   Simvastatin Other (See Comments)    Severe muscle pain    Review of systems is negative except as noted in the HPI.  Denies nausea/ vomiting/ fevers/ chills or night sweats.   Denies difficulty breathing, denies calf pain or tenderness  Physical Exam  Physical Exam:  Vascular: DP/PT pulses 2/4 right . CFT <3 seconds. Absent hair growth on digits.       Skin.    Ulceration noted to tip of right hallux has healed. . No erythema edema or purulence noted. Nails 1-5 bilateral are thickened and elongated with subungual debris.    Musculoskeletal: MMT 5/5 bilateral lower extremities in DF, PF, Inversion and Eversion. Deceased ROM in DF of ankle joint. Amputation of right second digit.   Neurological: Sensation intact to light touch. Protective sensation diminished bilateral.    Assessment:    ICD-10-CM   1. Pain due to onychomycosis of toenails of both feet  B35.1    M79.675    M79.674     2. DM type 2 with diabetic peripheral neuropathy (HCC)  E11.42           Plan: -Discussed and educated patient on diabetic foot care, especially with  regards to the vascular, neurological and musculoskeletal systems.  -Stressed the importance of good glycemic control and the detriment of not  controlling  glucose levels in relation to the foot. -Discussed supportive shoes at all times and checking feet regularly.  -Mechanically debrided all nails 1-5 bilateral using sterile nail nipper and filed with dremel without incident  -Answered all patient questions -Patient to return  in 3 months for at risk foot care -Patient advised to call the office if any problems or questions arise in the meantime.      Return in about 3 months (around 12/01/2022) for rfc.

## 2022-09-13 DIAGNOSIS — R262 Difficulty in walking, not elsewhere classified: Secondary | ICD-10-CM | POA: Diagnosis not present

## 2022-09-13 DIAGNOSIS — M25662 Stiffness of left knee, not elsewhere classified: Secondary | ICD-10-CM | POA: Diagnosis not present

## 2022-09-13 DIAGNOSIS — M25562 Pain in left knee: Secondary | ICD-10-CM | POA: Diagnosis not present

## 2022-09-13 DIAGNOSIS — M1712 Unilateral primary osteoarthritis, left knee: Secondary | ICD-10-CM | POA: Diagnosis not present

## 2022-09-20 DIAGNOSIS — M25662 Stiffness of left knee, not elsewhere classified: Secondary | ICD-10-CM | POA: Diagnosis not present

## 2022-09-20 DIAGNOSIS — M1712 Unilateral primary osteoarthritis, left knee: Secondary | ICD-10-CM | POA: Diagnosis not present

## 2022-09-20 DIAGNOSIS — M25562 Pain in left knee: Secondary | ICD-10-CM | POA: Diagnosis not present

## 2022-09-20 DIAGNOSIS — R262 Difficulty in walking, not elsewhere classified: Secondary | ICD-10-CM | POA: Diagnosis not present

## 2022-09-22 DIAGNOSIS — L03032 Cellulitis of left toe: Secondary | ICD-10-CM | POA: Diagnosis not present

## 2022-09-25 DIAGNOSIS — L03032 Cellulitis of left toe: Secondary | ICD-10-CM | POA: Diagnosis not present

## 2022-09-27 DIAGNOSIS — M1712 Unilateral primary osteoarthritis, left knee: Secondary | ICD-10-CM | POA: Diagnosis not present

## 2022-09-27 DIAGNOSIS — M25662 Stiffness of left knee, not elsewhere classified: Secondary | ICD-10-CM | POA: Diagnosis not present

## 2022-09-27 DIAGNOSIS — M25562 Pain in left knee: Secondary | ICD-10-CM | POA: Diagnosis not present

## 2022-09-27 DIAGNOSIS — R262 Difficulty in walking, not elsewhere classified: Secondary | ICD-10-CM | POA: Diagnosis not present

## 2022-10-04 DIAGNOSIS — M1712 Unilateral primary osteoarthritis, left knee: Secondary | ICD-10-CM | POA: Diagnosis not present

## 2022-10-04 DIAGNOSIS — M25662 Stiffness of left knee, not elsewhere classified: Secondary | ICD-10-CM | POA: Diagnosis not present

## 2022-10-04 DIAGNOSIS — R262 Difficulty in walking, not elsewhere classified: Secondary | ICD-10-CM | POA: Diagnosis not present

## 2022-10-04 DIAGNOSIS — M25562 Pain in left knee: Secondary | ICD-10-CM | POA: Diagnosis not present

## 2022-10-05 ENCOUNTER — Ambulatory Visit (INDEPENDENT_AMBULATORY_CARE_PROVIDER_SITE_OTHER): Payer: PPO | Admitting: Podiatry

## 2022-10-05 ENCOUNTER — Encounter: Payer: Self-pay | Admitting: Podiatry

## 2022-10-05 ENCOUNTER — Ambulatory Visit (INDEPENDENT_AMBULATORY_CARE_PROVIDER_SITE_OTHER): Payer: PPO

## 2022-10-05 DIAGNOSIS — L97521 Non-pressure chronic ulcer of other part of left foot limited to breakdown of skin: Secondary | ICD-10-CM

## 2022-10-05 DIAGNOSIS — E1142 Type 2 diabetes mellitus with diabetic polyneuropathy: Secondary | ICD-10-CM | POA: Diagnosis not present

## 2022-10-05 DIAGNOSIS — Z7984 Long term (current) use of oral hypoglycemic drugs: Secondary | ICD-10-CM

## 2022-10-05 DIAGNOSIS — M7989 Other specified soft tissue disorders: Secondary | ICD-10-CM | POA: Diagnosis not present

## 2022-10-05 NOTE — Progress Notes (Signed)
Chief Complaint  Patient presents with   Toe Pain    Left foot toe pain     HPI: Patient is 72 y.o. male who presents today with new concern for left second toe. Relates several weeks ago developed a blister on top of the left second toe. He was seen in urgent care and given antibiotics and asked to follow-up. Relates it is doing better but does have some redness at the tip of the toe.  Patient is diabetic and last A1c was  Lab Results  Component Value Date   HGBA1C 6.2 (H) 05/15/2021   .  He is on metformin and has neuropathy   PCP:  Gweneth Dimitri, MD      Allergies  Allergen Reactions   Fenofibrate Other (See Comments)    Caused gallstones   Gemfibrozil Other (See Comments)    Severe muscle pain   Simvastatin Other (See Comments)    Severe muscle pain    Review of systems is negative except as noted in the HPI.  Denies nausea/ vomiting/ fevers/ chills or night sweats.   Denies difficulty breathing, denies calf pain or tenderness  Physical Exam  Physical Exam:  Vascular: DP/PT pulses 2/4 right . CFT <3 seconds. Absent hair growth on digits.       Skin.    Ulceration noted to tip of right hallux has healed. . No erythema edema or purulence noted. Nails 1-5 bilateral are thickened and elongated with subungual debris.  Left second digit dried blood noted to base of dorsum of toe upon debridement very superficial ulceration about 0.1 cm with granular base. No surrounding erythema edema or purulence noted.  Some mild erythema noted to the tip of the toe likely from pressure in shoes.   Musculoskeletal: MMT 5/5 bilateral lower extremities in DF, PF, Inversion and Eversion. Deceased ROM in DF of ankle joint. Amputation of right second digit.   Neurological: Sensation intact to light touch. Protective sensation diminished bilateral.    Assessment:    ICD-10-CM   1. Skin ulcer of second toe of left foot, limited to breakdown of skin (HCC)  L97.521 DG Foot Complete Left    2.  DM type 2 with diabetic peripheral neuropathy (HCC)  E11.42 DG Foot Complete Left           Plan: Ulcer Left second digit limited to breakdown of skin  -Debridement as below. -Dressed with neosporin, DSD. -No abx indicated. No need for continued as has finished course.  -Discussed glucose control and proper protein-rich diet.  -Discussed if any worsening redness, pain, fever or chills to call or may need to report to the emergency room. Patient expressed understanding.   Procedure: Excisional Debridement of Wound Rationale: Removal of non-viable soft tissue from the wound to promote healing.  Anesthesia: none Pre-Debridement Wound Measurements: Overlying dried hemorrhagic tissue Post-Debridement Wound Measurements: 0.1 cm x 0.1 cm x 0.1 cm  Type of Debridement: Sharp Excisional Tissue Removed: Non-viable soft tissue Depth of Debridement: subcutaneous tissue. Technique: Sharp excisional debridement to bleeding, viable wound base.  Dressing: Dry, sterile, compression dressing. Disposition: Patient tolerated procedure well. Patient to return in 2 week for follow-up.  Return in about 2 weeks (around 10/19/2022) for wound check.        Return in about 2 weeks (around 10/19/2022) for wound check.

## 2022-10-11 DIAGNOSIS — R262 Difficulty in walking, not elsewhere classified: Secondary | ICD-10-CM | POA: Diagnosis not present

## 2022-10-11 DIAGNOSIS — M25662 Stiffness of left knee, not elsewhere classified: Secondary | ICD-10-CM | POA: Diagnosis not present

## 2022-10-11 DIAGNOSIS — M25562 Pain in left knee: Secondary | ICD-10-CM | POA: Diagnosis not present

## 2022-10-11 DIAGNOSIS — M1712 Unilateral primary osteoarthritis, left knee: Secondary | ICD-10-CM | POA: Diagnosis not present

## 2022-10-18 DIAGNOSIS — M1712 Unilateral primary osteoarthritis, left knee: Secondary | ICD-10-CM | POA: Diagnosis not present

## 2022-10-18 DIAGNOSIS — M25562 Pain in left knee: Secondary | ICD-10-CM | POA: Diagnosis not present

## 2022-10-18 DIAGNOSIS — R262 Difficulty in walking, not elsewhere classified: Secondary | ICD-10-CM | POA: Diagnosis not present

## 2022-10-18 DIAGNOSIS — M25662 Stiffness of left knee, not elsewhere classified: Secondary | ICD-10-CM | POA: Diagnosis not present

## 2022-10-25 DIAGNOSIS — M25662 Stiffness of left knee, not elsewhere classified: Secondary | ICD-10-CM | POA: Diagnosis not present

## 2022-10-25 DIAGNOSIS — R262 Difficulty in walking, not elsewhere classified: Secondary | ICD-10-CM | POA: Diagnosis not present

## 2022-10-25 DIAGNOSIS — M1712 Unilateral primary osteoarthritis, left knee: Secondary | ICD-10-CM | POA: Diagnosis not present

## 2022-10-25 DIAGNOSIS — M25562 Pain in left knee: Secondary | ICD-10-CM | POA: Diagnosis not present

## 2022-10-27 ENCOUNTER — Encounter: Payer: Self-pay | Admitting: Podiatry

## 2022-10-27 ENCOUNTER — Ambulatory Visit: Payer: PPO | Admitting: Podiatry

## 2022-10-27 DIAGNOSIS — L97521 Non-pressure chronic ulcer of other part of left foot limited to breakdown of skin: Secondary | ICD-10-CM | POA: Diagnosis not present

## 2022-10-27 DIAGNOSIS — E1142 Type 2 diabetes mellitus with diabetic polyneuropathy: Secondary | ICD-10-CM | POA: Diagnosis not present

## 2022-10-27 NOTE — Progress Notes (Signed)
Chief Complaint  Patient presents with   Wound Check    Wound check      HPI: Patient is 72 y.o. male who presents today with  for follow-up of left second toe wound. Relates it is doing better and has been dressing as instructed. Patient is diabetic and last A1c was  Lab Results  Component Value Date   HGBA1C 6.2 (H) 05/15/2021   .  He is on metformin and has neuropathy   PCP:  Gweneth Dimitri, MD      Allergies  Allergen Reactions   Fenofibrate Other (See Comments)    Caused gallstones   Gemfibrozil Other (See Comments)    Severe muscle pain   Simvastatin Other (See Comments)    Severe muscle pain    Review of systems is negative except as noted in the HPI.  Denies nausea/ vomiting/ fevers/ chills or night sweats.   Denies difficulty breathing, denies calf pain or tenderness  Physical Exam  Physical Exam:  Vascular: DP/PT pulses 2/4 right . CFT <3 seconds. Absent hair growth on digits.       Skin.    Ulceration noted to tip of right hallux has healed. . No erythema edema or purulence noted. Nails 1-5 bilateral are thickened and elongated with subungual debris.  Left second digit ulceration healed.  Some mild erythema noted to the tip of the toe likely from pressure in shoes.   Musculoskeletal: MMT 5/5 bilateral lower extremities in DF, PF, Inversion and Eversion. Deceased ROM in DF of ankle joint. Amputation of right second digit.   Neurological: Sensation intact to light touch. Protective sensation diminished bilateral.    Assessment:    ICD-10-CM   1. Skin ulcer of second toe of left foot, limited to breakdown of skin (HCC)  L97.521     2. DM type 2 with diabetic peripheral neuropathy (HCC)  E11.42             Plan: Ulcer Left second digit healed.  -No dressing or debridement needed.  -No abx indicated.  -Discussed glucose control and proper protein-rich diet.  -Discussed if any worsening redness, pain, fever or chills to call or may need to report to  the emergency room. Patient expressed understanding.  Return for routine care appointment.   No follow-ups on file.        No follow-ups on file.

## 2022-11-07 DIAGNOSIS — L820 Inflamed seborrheic keratosis: Secondary | ICD-10-CM | POA: Diagnosis not present

## 2022-11-30 ENCOUNTER — Ambulatory Visit: Payer: PPO | Admitting: Podiatry

## 2022-11-30 ENCOUNTER — Encounter: Payer: Self-pay | Admitting: Podiatry

## 2022-11-30 DIAGNOSIS — E1142 Type 2 diabetes mellitus with diabetic polyneuropathy: Secondary | ICD-10-CM

## 2022-11-30 DIAGNOSIS — M79675 Pain in left toe(s): Secondary | ICD-10-CM | POA: Diagnosis not present

## 2022-11-30 DIAGNOSIS — B351 Tinea unguium: Secondary | ICD-10-CM | POA: Diagnosis not present

## 2022-11-30 DIAGNOSIS — M79674 Pain in right toe(s): Secondary | ICD-10-CM

## 2022-11-30 NOTE — Progress Notes (Signed)
No chief complaint on file.    HPI: Patient is 72 y.o. male who presents today with concern of thickened elongated and painful nails that are difficult to trim. Requesting to have them trimmed today. Relates burning and tingling in their feet. Patient is diabetic and last A1c was  Lab Results  Component Value Date   HGBA1C 6.2 (H) 05/15/2021   .  He is on metformin and has neuropathy   PCP:  Gweneth Dimitri, MD      Allergies  Allergen Reactions   Fenofibrate Other (See Comments)    Caused gallstones   Gemfibrozil Other (See Comments)    Severe muscle pain   Simvastatin Other (See Comments)    Severe muscle pain    Review of systems is negative except as noted in the HPI.  Denies nausea/ vomiting/ fevers/ chills or night sweats.   Denies difficulty breathing, denies calf pain or tenderness  Physical Exam  Physical Exam:  Vascular: DP/PT pulses 2/4 right . CFT <3 seconds. Absent hair growth on digits.       Skin.    Ulceration noted to tip of right hallux has healed. . No erythema edema or purulence noted. Nails 1-5 bilateral are thickened and elongated with subungual debris.    Musculoskeletal: MMT 5/5 bilateral lower extremities in DF, PF, Inversion and Eversion. Deceased ROM in DF of ankle joint. Amputation of right second digit.   Neurological: Sensation intact to light touch. Protective sensation diminished bilateral.    Assessment:  No diagnosis found.       Plan: -Discussed and educated patient on diabetic foot care, especially with  regards to the vascular, neurological and musculoskeletal systems.  -Stressed the importance of good glycemic control and the detriment of not  controlling glucose levels in relation to the foot. -Discussed supportive shoes at all times and checking feet regularly.  -Mechanically debrided all nails 1-5 bilateral using sterile nail nipper and filed with dremel without incident  -Answered all patient questions -Patient to return   in 3 months for at risk foot care -Patient advised to call the office if any problems or questions arise in the meantime.      No follow-ups on file.

## 2022-12-13 DIAGNOSIS — R262 Difficulty in walking, not elsewhere classified: Secondary | ICD-10-CM | POA: Diagnosis not present

## 2022-12-13 DIAGNOSIS — D225 Melanocytic nevi of trunk: Secondary | ICD-10-CM | POA: Diagnosis not present

## 2022-12-13 DIAGNOSIS — M25661 Stiffness of right knee, not elsewhere classified: Secondary | ICD-10-CM | POA: Diagnosis not present

## 2022-12-13 DIAGNOSIS — M1711 Unilateral primary osteoarthritis, right knee: Secondary | ICD-10-CM | POA: Diagnosis not present

## 2022-12-13 DIAGNOSIS — Z85820 Personal history of malignant melanoma of skin: Secondary | ICD-10-CM | POA: Diagnosis not present

## 2022-12-13 DIAGNOSIS — M25561 Pain in right knee: Secondary | ICD-10-CM | POA: Diagnosis not present

## 2022-12-13 DIAGNOSIS — L853 Xerosis cutis: Secondary | ICD-10-CM | POA: Diagnosis not present

## 2022-12-13 DIAGNOSIS — D485 Neoplasm of uncertain behavior of skin: Secondary | ICD-10-CM | POA: Diagnosis not present

## 2022-12-20 DIAGNOSIS — M25561 Pain in right knee: Secondary | ICD-10-CM | POA: Diagnosis not present

## 2022-12-20 DIAGNOSIS — R262 Difficulty in walking, not elsewhere classified: Secondary | ICD-10-CM | POA: Diagnosis not present

## 2022-12-20 DIAGNOSIS — M25661 Stiffness of right knee, not elsewhere classified: Secondary | ICD-10-CM | POA: Diagnosis not present

## 2022-12-20 DIAGNOSIS — M1711 Unilateral primary osteoarthritis, right knee: Secondary | ICD-10-CM | POA: Diagnosis not present

## 2022-12-27 DIAGNOSIS — R262 Difficulty in walking, not elsewhere classified: Secondary | ICD-10-CM | POA: Diagnosis not present

## 2022-12-27 DIAGNOSIS — M1711 Unilateral primary osteoarthritis, right knee: Secondary | ICD-10-CM | POA: Diagnosis not present

## 2022-12-27 DIAGNOSIS — M25661 Stiffness of right knee, not elsewhere classified: Secondary | ICD-10-CM | POA: Diagnosis not present

## 2022-12-27 DIAGNOSIS — M25561 Pain in right knee: Secondary | ICD-10-CM | POA: Diagnosis not present

## 2023-01-03 DIAGNOSIS — R262 Difficulty in walking, not elsewhere classified: Secondary | ICD-10-CM | POA: Diagnosis not present

## 2023-01-03 DIAGNOSIS — M25561 Pain in right knee: Secondary | ICD-10-CM | POA: Diagnosis not present

## 2023-01-03 DIAGNOSIS — M25661 Stiffness of right knee, not elsewhere classified: Secondary | ICD-10-CM | POA: Diagnosis not present

## 2023-01-03 DIAGNOSIS — M1711 Unilateral primary osteoarthritis, right knee: Secondary | ICD-10-CM | POA: Diagnosis not present

## 2023-01-10 DIAGNOSIS — R262 Difficulty in walking, not elsewhere classified: Secondary | ICD-10-CM | POA: Diagnosis not present

## 2023-01-10 DIAGNOSIS — M25661 Stiffness of right knee, not elsewhere classified: Secondary | ICD-10-CM | POA: Diagnosis not present

## 2023-01-10 DIAGNOSIS — M1711 Unilateral primary osteoarthritis, right knee: Secondary | ICD-10-CM | POA: Diagnosis not present

## 2023-01-10 DIAGNOSIS — M25561 Pain in right knee: Secondary | ICD-10-CM | POA: Diagnosis not present

## 2023-02-07 DIAGNOSIS — R809 Proteinuria, unspecified: Secondary | ICD-10-CM | POA: Diagnosis not present

## 2023-02-07 DIAGNOSIS — E1142 Type 2 diabetes mellitus with diabetic polyneuropathy: Secondary | ICD-10-CM | POA: Diagnosis not present

## 2023-02-22 DIAGNOSIS — N400 Enlarged prostate without lower urinary tract symptoms: Secondary | ICD-10-CM | POA: Diagnosis not present

## 2023-02-22 DIAGNOSIS — E66811 Obesity, class 1: Secondary | ICD-10-CM | POA: Diagnosis not present

## 2023-02-22 DIAGNOSIS — E1122 Type 2 diabetes mellitus with diabetic chronic kidney disease: Secondary | ICD-10-CM | POA: Diagnosis not present

## 2023-02-22 DIAGNOSIS — I10 Essential (primary) hypertension: Secondary | ICD-10-CM | POA: Diagnosis not present

## 2023-02-22 DIAGNOSIS — E785 Hyperlipidemia, unspecified: Secondary | ICD-10-CM | POA: Diagnosis not present

## 2023-02-22 DIAGNOSIS — Z6833 Body mass index (BMI) 33.0-33.9, adult: Secondary | ICD-10-CM | POA: Diagnosis not present

## 2023-02-22 DIAGNOSIS — B351 Tinea unguium: Secondary | ICD-10-CM | POA: Diagnosis not present

## 2023-02-22 DIAGNOSIS — N182 Chronic kidney disease, stage 2 (mild): Secondary | ICD-10-CM | POA: Diagnosis not present

## 2023-02-22 DIAGNOSIS — Z89411 Acquired absence of right great toe: Secondary | ICD-10-CM | POA: Diagnosis not present

## 2023-02-22 DIAGNOSIS — E1142 Type 2 diabetes mellitus with diabetic polyneuropathy: Secondary | ICD-10-CM | POA: Diagnosis not present

## 2023-02-22 DIAGNOSIS — R801 Persistent proteinuria, unspecified: Secondary | ICD-10-CM | POA: Diagnosis not present

## 2023-03-01 ENCOUNTER — Encounter: Payer: Self-pay | Admitting: Podiatry

## 2023-03-01 ENCOUNTER — Ambulatory Visit: Payer: PPO | Admitting: Podiatry

## 2023-03-01 DIAGNOSIS — M79674 Pain in right toe(s): Secondary | ICD-10-CM | POA: Diagnosis not present

## 2023-03-01 DIAGNOSIS — B351 Tinea unguium: Secondary | ICD-10-CM

## 2023-03-01 DIAGNOSIS — E1142 Type 2 diabetes mellitus with diabetic polyneuropathy: Secondary | ICD-10-CM | POA: Diagnosis not present

## 2023-03-01 DIAGNOSIS — M79675 Pain in left toe(s): Secondary | ICD-10-CM

## 2023-03-01 NOTE — Progress Notes (Signed)
Chief Complaint  Patient presents with   RFC    RFC     HPI: Patient is 72 y.o. male who presents today with concern of thickened elongated and painful nails that are difficult to trim. Requesting to have them trimmed today. Relates burning and tingling in their feet. Patient is diabetic and last A1c was  Lab Results  Component Value Date   HGBA1C 6.2 (H) 05/15/2021   .  He is on metformin and has neuropathy   PCP:  Gweneth Dimitri, MD      Allergies  Allergen Reactions   Fenofibrate Other (See Comments)    Caused gallstones   Gemfibrozil Other (See Comments)    Severe muscle pain   Simvastatin Other (See Comments)    Severe muscle pain    Review of systems is negative except as noted in the HPI.  Denies nausea/ vomiting/ fevers/ chills or night sweats.   Denies difficulty breathing, denies calf pain or tenderness  Physical Exam  Physical Exam:  Vascular: DP/PT pulses 2/4 right . CFT <3 seconds. Absent hair growth on digits.       Skin.    Ulceration noted to tip of right hallux has healed. . No erythema edema or purulence noted. Nails 1-5 bilateral are thickened and elongated with subungual debris.    Musculoskeletal: MMT 5/5 bilateral lower extremities in DF, PF, Inversion and Eversion. Deceased ROM in DF of ankle joint. Amputation of right second digit.   Neurological: Sensation intact to light touch. Protective sensation diminished bilateral.    Assessment:    ICD-10-CM   1. Pain due to onychomycosis of toenails of both feet  B35.1    M79.675    M79.674     2. DM type 2 with diabetic peripheral neuropathy (HCC)  E11.42            Plan: -Discussed and educated patient on diabetic foot care, especially with  regards to the vascular, neurological and musculoskeletal systems.  -Stressed the importance of good glycemic control and the detriment of not  controlling glucose levels in relation to the foot. -Discussed supportive shoes at all times and checking  feet regularly.  -Mechanically debrided all nails 1-5 bilateral using sterile nail nipper and filed with dremel without incident  -Answered all patient questions -Patient to return  in 3 months for at risk foot care -Patient advised to call the office if any problems or questions arise in the meantime.      No follow-ups on file.

## 2023-04-04 DIAGNOSIS — M1711 Unilateral primary osteoarthritis, right knee: Secondary | ICD-10-CM | POA: Diagnosis not present

## 2023-04-04 DIAGNOSIS — M25661 Stiffness of right knee, not elsewhere classified: Secondary | ICD-10-CM | POA: Diagnosis not present

## 2023-04-04 DIAGNOSIS — M25561 Pain in right knee: Secondary | ICD-10-CM | POA: Diagnosis not present

## 2023-04-04 DIAGNOSIS — R262 Difficulty in walking, not elsewhere classified: Secondary | ICD-10-CM | POA: Diagnosis not present

## 2023-05-31 ENCOUNTER — Ambulatory Visit: Payer: PPO | Admitting: Podiatry

## 2023-06-07 ENCOUNTER — Ambulatory Visit: Payer: PPO | Admitting: Podiatry

## 2023-06-07 ENCOUNTER — Encounter: Payer: Self-pay | Admitting: Podiatry

## 2023-06-07 DIAGNOSIS — M79674 Pain in right toe(s): Secondary | ICD-10-CM

## 2023-06-07 DIAGNOSIS — E1142 Type 2 diabetes mellitus with diabetic polyneuropathy: Secondary | ICD-10-CM

## 2023-06-07 DIAGNOSIS — B351 Tinea unguium: Secondary | ICD-10-CM | POA: Diagnosis not present

## 2023-06-07 DIAGNOSIS — M79675 Pain in left toe(s): Secondary | ICD-10-CM | POA: Diagnosis not present

## 2023-06-07 NOTE — Progress Notes (Signed)
 No chief complaint on file.    HPI: Patient is 73 y.o. male who presents today with concern of thickened elongated and painful nails that are difficult to trim. Requesting to have them trimmed today. Relates burning and tingling in their feet. Patient is diabetic and last A1c was  Lab Results  Component Value Date   HGBA1C 6.2 (H) 05/15/2021   .  He is on metformin  and has neuropathy   PCP:  Aisha Harvey, MD      Allergies  Allergen Reactions   Fenofibrate Other (See Comments)    Caused gallstones   Gemfibrozil Other (See Comments)    Severe muscle pain   Simvastatin Other (See Comments)    Severe muscle pain    Review of systems is negative except as noted in the HPI.  Denies nausea/ vomiting/ fevers/ chills or night sweats.   Denies difficulty breathing, denies calf pain or tenderness  Physical Exam  Physical Exam:  Vascular: DP/PT pulses 2/4 right . CFT <3 seconds. Absent hair growth on digits.       Skin.    Ulceration noted to tip of right hallux has healed. . No erythema edema or purulence noted. Nails 1-5 bilateral are thickened and elongated with subungual debris.    Musculoskeletal: MMT 5/5 bilateral lower extremities in DF, PF, Inversion and Eversion. Deceased ROM in DF of ankle joint. Amputation of right second digit.   Neurological: Sensation intact to light touch. Protective sensation diminished bilateral.    Assessment:    ICD-10-CM   1. Pain due to onychomycosis of toenails of both feet  B35.1    M79.675    M79.674     2. DM type 2 with diabetic peripheral neuropathy (HCC)  E11.42            Plan: -Discussed and educated patient on diabetic foot care, especially with  regards to the vascular, neurological and musculoskeletal systems.  -Stressed the importance of good glycemic control and the detriment of not  controlling glucose levels in relation to the foot. -Discussed supportive shoes at all times and checking feet regularly.   -Mechanically debrided all nails 1-5 bilateral using sterile nail nipper and filed with dremel without incident  -Answered all patient questions -Patient to return  in 3 months for at risk foot care -Patient advised to call the office if any problems or questions arise in the meantime.      No follow-ups on file.

## 2023-08-16 DIAGNOSIS — R801 Persistent proteinuria, unspecified: Secondary | ICD-10-CM | POA: Diagnosis not present

## 2023-08-16 DIAGNOSIS — E1142 Type 2 diabetes mellitus with diabetic polyneuropathy: Secondary | ICD-10-CM | POA: Diagnosis not present

## 2023-08-16 DIAGNOSIS — E785 Hyperlipidemia, unspecified: Secondary | ICD-10-CM | POA: Diagnosis not present

## 2023-08-21 DIAGNOSIS — L0100 Impetigo, unspecified: Secondary | ICD-10-CM | POA: Diagnosis not present

## 2023-08-21 DIAGNOSIS — T63481D Toxic effect of venom of other arthropod, accidental (unintentional), subsequent encounter: Secondary | ICD-10-CM | POA: Diagnosis not present

## 2023-08-23 DIAGNOSIS — Z0000 Encounter for general adult medical examination without abnormal findings: Secondary | ICD-10-CM | POA: Diagnosis not present

## 2023-08-23 DIAGNOSIS — G4440 Drug-induced headache, not elsewhere classified, not intractable: Secondary | ICD-10-CM | POA: Diagnosis not present

## 2023-08-23 DIAGNOSIS — G629 Polyneuropathy, unspecified: Secondary | ICD-10-CM | POA: Diagnosis not present

## 2023-08-23 DIAGNOSIS — E1142 Type 2 diabetes mellitus with diabetic polyneuropathy: Secondary | ICD-10-CM | POA: Diagnosis not present

## 2023-08-23 DIAGNOSIS — N182 Chronic kidney disease, stage 2 (mild): Secondary | ICD-10-CM | POA: Diagnosis not present

## 2023-08-23 DIAGNOSIS — Z1331 Encounter for screening for depression: Secondary | ICD-10-CM | POA: Diagnosis not present

## 2023-08-23 DIAGNOSIS — E785 Hyperlipidemia, unspecified: Secondary | ICD-10-CM | POA: Diagnosis not present

## 2023-08-23 DIAGNOSIS — Z79899 Other long term (current) drug therapy: Secondary | ICD-10-CM | POA: Diagnosis not present

## 2023-08-23 DIAGNOSIS — N400 Enlarged prostate without lower urinary tract symptoms: Secondary | ICD-10-CM | POA: Diagnosis not present

## 2023-08-23 DIAGNOSIS — I10 Essential (primary) hypertension: Secondary | ICD-10-CM | POA: Diagnosis not present

## 2023-08-23 DIAGNOSIS — Z6833 Body mass index (BMI) 33.0-33.9, adult: Secondary | ICD-10-CM | POA: Diagnosis not present

## 2023-09-06 ENCOUNTER — Ambulatory Visit: Payer: PPO | Admitting: Podiatry

## 2023-09-06 DIAGNOSIS — E1142 Type 2 diabetes mellitus with diabetic polyneuropathy: Secondary | ICD-10-CM | POA: Diagnosis not present

## 2023-09-06 DIAGNOSIS — M79675 Pain in left toe(s): Secondary | ICD-10-CM

## 2023-09-06 DIAGNOSIS — M79674 Pain in right toe(s): Secondary | ICD-10-CM | POA: Diagnosis not present

## 2023-09-06 DIAGNOSIS — B351 Tinea unguium: Secondary | ICD-10-CM | POA: Diagnosis not present

## 2023-09-06 NOTE — Progress Notes (Signed)
 No chief complaint on file.    HPI: Patient is 73 y.o. male who presents today with concern of thickened elongated and painful nails that are difficult to trim. Requesting to have them trimmed today. Relates burning and tingling in their feet. Patient is diabetic and last A1c was  Lab Results  Component Value Date   HGBA1C 6.2 (H) 05/15/2021   .  He is on metformin  and has neuropathy   PCP:  Helyn Lobstein, MD      Allergies  Allergen Reactions   Fenofibrate Other (See Comments)    Caused gallstones   Gemfibrozil Other (See Comments)    Severe muscle pain   Simvastatin Other (See Comments)    Severe muscle pain    Review of systems is negative except as noted in the HPI.  Denies nausea/ vomiting/ fevers/ chills or night sweats.   Denies difficulty breathing, denies calf pain or tenderness  Physical Exam  Physical Exam:  Vascular: DP/PT pulses 2/4 right . CFT <3 seconds. Absent hair growth on digits.       Skin.    Ulceration noted to tip of right hallux has healed. . No erythema edema or purulence noted. Nails 1-5 bilateral are thickened and elongated with subungual debris.    Musculoskeletal: MMT 5/5 bilateral lower extremities in DF, PF, Inversion and Eversion. Deceased ROM in DF of ankle joint. Amputation of right second digit.   Neurological: Sensation intact to light touch. Protective sensation diminished bilateral.    Assessment:    ICD-10-CM   1. Pain due to onychomycosis of toenails of both feet  B35.1    M79.674    M79.675     2. DM type 2 with diabetic peripheral neuropathy (HCC)  E11.42            Plan: -Discussed and educated patient on diabetic foot care, especially with  regards to the vascular, neurological and musculoskeletal systems.  -Stressed the importance of good glycemic control and the detriment of not  controlling glucose levels in relation to the foot. -Discussed supportive shoes at all times and checking feet regularly.   -Mechanically debrided all nails 1-5 bilateral using sterile nail nipper and filed with dremel without incident  -Answered all patient questions -Patient to return  in 3 months for at risk foot care -Patient advised to call the office if any problems or questions arise in the meantime.      No follow-ups on file.

## 2023-10-10 DIAGNOSIS — N182 Chronic kidney disease, stage 2 (mild): Secondary | ICD-10-CM | POA: Diagnosis not present

## 2023-10-10 DIAGNOSIS — I10 Essential (primary) hypertension: Secondary | ICD-10-CM | POA: Diagnosis not present

## 2023-10-10 DIAGNOSIS — E1151 Type 2 diabetes mellitus with diabetic peripheral angiopathy without gangrene: Secondary | ICD-10-CM | POA: Diagnosis not present

## 2023-10-10 DIAGNOSIS — E1142 Type 2 diabetes mellitus with diabetic polyneuropathy: Secondary | ICD-10-CM | POA: Diagnosis not present

## 2023-10-29 DIAGNOSIS — E1142 Type 2 diabetes mellitus with diabetic polyneuropathy: Secondary | ICD-10-CM | POA: Diagnosis not present

## 2023-10-29 DIAGNOSIS — E1129 Type 2 diabetes mellitus with other diabetic kidney complication: Secondary | ICD-10-CM | POA: Diagnosis not present

## 2023-10-29 DIAGNOSIS — I10 Essential (primary) hypertension: Secondary | ICD-10-CM | POA: Diagnosis not present

## 2023-10-29 DIAGNOSIS — N182 Chronic kidney disease, stage 2 (mild): Secondary | ICD-10-CM | POA: Diagnosis not present

## 2023-10-29 DIAGNOSIS — E1151 Type 2 diabetes mellitus with diabetic peripheral angiopathy without gangrene: Secondary | ICD-10-CM | POA: Diagnosis not present

## 2023-11-08 DIAGNOSIS — I10 Essential (primary) hypertension: Secondary | ICD-10-CM | POA: Diagnosis not present

## 2023-11-08 DIAGNOSIS — E1151 Type 2 diabetes mellitus with diabetic peripheral angiopathy without gangrene: Secondary | ICD-10-CM | POA: Diagnosis not present

## 2023-11-08 DIAGNOSIS — N182 Chronic kidney disease, stage 2 (mild): Secondary | ICD-10-CM | POA: Diagnosis not present

## 2023-11-08 DIAGNOSIS — E1142 Type 2 diabetes mellitus with diabetic polyneuropathy: Secondary | ICD-10-CM | POA: Diagnosis not present

## 2023-11-29 DIAGNOSIS — N182 Chronic kidney disease, stage 2 (mild): Secondary | ICD-10-CM | POA: Diagnosis not present

## 2023-11-29 DIAGNOSIS — E1129 Type 2 diabetes mellitus with other diabetic kidney complication: Secondary | ICD-10-CM | POA: Diagnosis not present

## 2023-11-29 DIAGNOSIS — E1151 Type 2 diabetes mellitus with diabetic peripheral angiopathy without gangrene: Secondary | ICD-10-CM | POA: Diagnosis not present

## 2023-11-29 DIAGNOSIS — I10 Essential (primary) hypertension: Secondary | ICD-10-CM | POA: Diagnosis not present

## 2023-11-29 DIAGNOSIS — E1142 Type 2 diabetes mellitus with diabetic polyneuropathy: Secondary | ICD-10-CM | POA: Diagnosis not present

## 2023-12-07 ENCOUNTER — Encounter: Payer: Self-pay | Admitting: Podiatry

## 2023-12-07 ENCOUNTER — Ambulatory Visit: Admitting: Podiatry

## 2023-12-07 ENCOUNTER — Telehealth: Payer: Self-pay | Admitting: *Deleted

## 2023-12-07 DIAGNOSIS — E1142 Type 2 diabetes mellitus with diabetic polyneuropathy: Secondary | ICD-10-CM | POA: Diagnosis not present

## 2023-12-07 DIAGNOSIS — B351 Tinea unguium: Secondary | ICD-10-CM

## 2023-12-07 DIAGNOSIS — M79674 Pain in right toe(s): Secondary | ICD-10-CM

## 2023-12-07 DIAGNOSIS — M79675 Pain in left toe(s): Secondary | ICD-10-CM

## 2023-12-07 NOTE — Telephone Encounter (Signed)
 Called and reminded patient of his appointment.

## 2023-12-07 NOTE — Progress Notes (Signed)
 No chief complaint on file.    HPI: Patient is 73 y.o. male who presents today with concern of thickened elongated and painful nails that are difficult to trim. Requesting to have them trimmed today. Relates burning and tingling in their feet. Patient is diabetic and last A1c was  Lab Results  Component Value Date   HGBA1C 6.2 (H) 05/15/2021   .  He is on metformin  and has neuropathy   PCP:  Aisha Harvey, MD      Allergies  Allergen Reactions   Fenofibrate Other (See Comments)    Caused gallstones   Gemfibrozil Other (See Comments)    Severe muscle pain   Simvastatin Other (See Comments)    Severe muscle pain    Review of systems is negative except as noted in the HPI.  Denies nausea/ vomiting/ fevers/ chills or night sweats.   Denies difficulty breathing, denies calf pain or tenderness  Physical Exam  Physical Exam:  Vascular: DP/PT pulses 2/4 right . CFT <3 seconds. Absent hair growth on digits.       Skin.    Ulceration noted to tip of right hallux has healed. . No erythema edema or purulence noted. Nails 1-5 bilateral are thickened and elongated with subungual debris.    Musculoskeletal: MMT 5/5 bilateral lower extremities in DF, PF, Inversion and Eversion. Deceased ROM in DF of ankle joint. Amputation of right second digit.   Neurological: Sensation intact to light touch. Protective sensation diminished bilateral.    Assessment:    ICD-10-CM   1. Pain due to onychomycosis of toenails of both feet  B35.1    M79.674    M79.675     2. DM type 2 with diabetic peripheral neuropathy (HCC)  E11.42            Plan: -Discussed and educated patient on diabetic foot care, especially with  regards to the vascular, neurological and musculoskeletal systems.  -Stressed the importance of good glycemic control and the detriment of not  controlling glucose levels in relation to the foot. -Discussed supportive shoes at all times and checking feet regularly.   -Mechanically debrided all nails 1-5 bilateral using sterile nail nipper and filed with dremel without incident  -Answered all patient questions -Patient to return  in 3 months for at risk foot care -Patient advised to call the office if any problems or questions arise in the meantime.      Return in about 3 months (around 03/08/2024) for rfc.

## 2023-12-08 DIAGNOSIS — E1151 Type 2 diabetes mellitus with diabetic peripheral angiopathy without gangrene: Secondary | ICD-10-CM | POA: Diagnosis not present

## 2023-12-08 DIAGNOSIS — E1142 Type 2 diabetes mellitus with diabetic polyneuropathy: Secondary | ICD-10-CM | POA: Diagnosis not present

## 2023-12-08 DIAGNOSIS — N182 Chronic kidney disease, stage 2 (mild): Secondary | ICD-10-CM | POA: Diagnosis not present

## 2023-12-08 DIAGNOSIS — I10 Essential (primary) hypertension: Secondary | ICD-10-CM | POA: Diagnosis not present

## 2023-12-12 DIAGNOSIS — Z85820 Personal history of malignant melanoma of skin: Secondary | ICD-10-CM | POA: Diagnosis not present

## 2023-12-12 DIAGNOSIS — L821 Other seborrheic keratosis: Secondary | ICD-10-CM | POA: Diagnosis not present

## 2023-12-12 DIAGNOSIS — I872 Venous insufficiency (chronic) (peripheral): Secondary | ICD-10-CM | POA: Diagnosis not present

## 2023-12-19 DIAGNOSIS — M65342 Trigger finger, left ring finger: Secondary | ICD-10-CM | POA: Diagnosis not present

## 2023-12-19 DIAGNOSIS — R031 Nonspecific low blood-pressure reading: Secondary | ICD-10-CM | POA: Diagnosis not present

## 2023-12-30 DIAGNOSIS — E1151 Type 2 diabetes mellitus with diabetic peripheral angiopathy without gangrene: Secondary | ICD-10-CM | POA: Diagnosis not present

## 2023-12-30 DIAGNOSIS — N182 Chronic kidney disease, stage 2 (mild): Secondary | ICD-10-CM | POA: Diagnosis not present

## 2023-12-30 DIAGNOSIS — E1129 Type 2 diabetes mellitus with other diabetic kidney complication: Secondary | ICD-10-CM | POA: Diagnosis not present

## 2023-12-30 DIAGNOSIS — E1142 Type 2 diabetes mellitus with diabetic polyneuropathy: Secondary | ICD-10-CM | POA: Diagnosis not present

## 2023-12-30 DIAGNOSIS — I10 Essential (primary) hypertension: Secondary | ICD-10-CM | POA: Diagnosis not present

## 2024-01-07 DIAGNOSIS — E1142 Type 2 diabetes mellitus with diabetic polyneuropathy: Secondary | ICD-10-CM | POA: Diagnosis not present

## 2024-01-07 DIAGNOSIS — I10 Essential (primary) hypertension: Secondary | ICD-10-CM | POA: Diagnosis not present

## 2024-01-07 DIAGNOSIS — E1151 Type 2 diabetes mellitus with diabetic peripheral angiopathy without gangrene: Secondary | ICD-10-CM | POA: Diagnosis not present

## 2024-01-07 DIAGNOSIS — N182 Chronic kidney disease, stage 2 (mild): Secondary | ICD-10-CM | POA: Diagnosis not present

## 2024-01-08 DIAGNOSIS — M25562 Pain in left knee: Secondary | ICD-10-CM | POA: Diagnosis not present

## 2024-01-08 DIAGNOSIS — M1712 Unilateral primary osteoarthritis, left knee: Secondary | ICD-10-CM | POA: Diagnosis not present

## 2024-01-08 DIAGNOSIS — R262 Difficulty in walking, not elsewhere classified: Secondary | ICD-10-CM | POA: Diagnosis not present

## 2024-01-08 DIAGNOSIS — M25662 Stiffness of left knee, not elsewhere classified: Secondary | ICD-10-CM | POA: Diagnosis not present

## 2024-01-15 DIAGNOSIS — R262 Difficulty in walking, not elsewhere classified: Secondary | ICD-10-CM | POA: Diagnosis not present

## 2024-01-15 DIAGNOSIS — M25662 Stiffness of left knee, not elsewhere classified: Secondary | ICD-10-CM | POA: Diagnosis not present

## 2024-01-15 DIAGNOSIS — M1712 Unilateral primary osteoarthritis, left knee: Secondary | ICD-10-CM | POA: Diagnosis not present

## 2024-01-15 DIAGNOSIS — M25562 Pain in left knee: Secondary | ICD-10-CM | POA: Diagnosis not present

## 2024-01-22 DIAGNOSIS — M25662 Stiffness of left knee, not elsewhere classified: Secondary | ICD-10-CM | POA: Diagnosis not present

## 2024-01-22 DIAGNOSIS — M25562 Pain in left knee: Secondary | ICD-10-CM | POA: Diagnosis not present

## 2024-01-22 DIAGNOSIS — R262 Difficulty in walking, not elsewhere classified: Secondary | ICD-10-CM | POA: Diagnosis not present

## 2024-01-22 DIAGNOSIS — M1712 Unilateral primary osteoarthritis, left knee: Secondary | ICD-10-CM | POA: Diagnosis not present

## 2024-01-29 DIAGNOSIS — M25562 Pain in left knee: Secondary | ICD-10-CM | POA: Diagnosis not present

## 2024-01-29 DIAGNOSIS — E1142 Type 2 diabetes mellitus with diabetic polyneuropathy: Secondary | ICD-10-CM | POA: Diagnosis not present

## 2024-01-29 DIAGNOSIS — N182 Chronic kidney disease, stage 2 (mild): Secondary | ICD-10-CM | POA: Diagnosis not present

## 2024-01-29 DIAGNOSIS — M1712 Unilateral primary osteoarthritis, left knee: Secondary | ICD-10-CM | POA: Diagnosis not present

## 2024-01-29 DIAGNOSIS — E1151 Type 2 diabetes mellitus with diabetic peripheral angiopathy without gangrene: Secondary | ICD-10-CM | POA: Diagnosis not present

## 2024-01-29 DIAGNOSIS — I10 Essential (primary) hypertension: Secondary | ICD-10-CM | POA: Diagnosis not present

## 2024-01-29 DIAGNOSIS — M25662 Stiffness of left knee, not elsewhere classified: Secondary | ICD-10-CM | POA: Diagnosis not present

## 2024-01-29 DIAGNOSIS — R262 Difficulty in walking, not elsewhere classified: Secondary | ICD-10-CM | POA: Diagnosis not present

## 2024-01-29 DIAGNOSIS — E1129 Type 2 diabetes mellitus with other diabetic kidney complication: Secondary | ICD-10-CM | POA: Diagnosis not present

## 2024-02-05 DIAGNOSIS — M25662 Stiffness of left knee, not elsewhere classified: Secondary | ICD-10-CM | POA: Diagnosis not present

## 2024-02-05 DIAGNOSIS — R262 Difficulty in walking, not elsewhere classified: Secondary | ICD-10-CM | POA: Diagnosis not present

## 2024-02-05 DIAGNOSIS — M1712 Unilateral primary osteoarthritis, left knee: Secondary | ICD-10-CM | POA: Diagnosis not present

## 2024-02-05 DIAGNOSIS — M25562 Pain in left knee: Secondary | ICD-10-CM | POA: Diagnosis not present

## 2024-02-06 DIAGNOSIS — I10 Essential (primary) hypertension: Secondary | ICD-10-CM | POA: Diagnosis not present

## 2024-02-06 DIAGNOSIS — N182 Chronic kidney disease, stage 2 (mild): Secondary | ICD-10-CM | POA: Diagnosis not present

## 2024-02-06 DIAGNOSIS — E1151 Type 2 diabetes mellitus with diabetic peripheral angiopathy without gangrene: Secondary | ICD-10-CM | POA: Diagnosis not present

## 2024-02-06 DIAGNOSIS — E1142 Type 2 diabetes mellitus with diabetic polyneuropathy: Secondary | ICD-10-CM | POA: Diagnosis not present

## 2024-02-19 DIAGNOSIS — E1142 Type 2 diabetes mellitus with diabetic polyneuropathy: Secondary | ICD-10-CM | POA: Diagnosis not present

## 2024-02-19 DIAGNOSIS — Z79899 Other long term (current) drug therapy: Secondary | ICD-10-CM | POA: Diagnosis not present

## 2024-02-20 DIAGNOSIS — Z85820 Personal history of malignant melanoma of skin: Secondary | ICD-10-CM | POA: Diagnosis not present

## 2024-02-20 DIAGNOSIS — L308 Other specified dermatitis: Secondary | ICD-10-CM | POA: Diagnosis not present

## 2024-02-20 DIAGNOSIS — D485 Neoplasm of uncertain behavior of skin: Secondary | ICD-10-CM | POA: Diagnosis not present

## 2024-02-26 DIAGNOSIS — E66811 Obesity, class 1: Secondary | ICD-10-CM | POA: Diagnosis not present

## 2024-02-26 DIAGNOSIS — G629 Polyneuropathy, unspecified: Secondary | ICD-10-CM | POA: Diagnosis not present

## 2024-02-26 DIAGNOSIS — N400 Enlarged prostate without lower urinary tract symptoms: Secondary | ICD-10-CM | POA: Diagnosis not present

## 2024-02-26 DIAGNOSIS — E785 Hyperlipidemia, unspecified: Secondary | ICD-10-CM | POA: Diagnosis not present

## 2024-02-26 DIAGNOSIS — Z6833 Body mass index (BMI) 33.0-33.9, adult: Secondary | ICD-10-CM | POA: Diagnosis not present

## 2024-02-26 DIAGNOSIS — E1142 Type 2 diabetes mellitus with diabetic polyneuropathy: Secondary | ICD-10-CM | POA: Diagnosis not present

## 2024-02-26 DIAGNOSIS — I10 Essential (primary) hypertension: Secondary | ICD-10-CM | POA: Diagnosis not present

## 2024-02-29 DIAGNOSIS — N182 Chronic kidney disease, stage 2 (mild): Secondary | ICD-10-CM | POA: Diagnosis not present

## 2024-02-29 DIAGNOSIS — I10 Essential (primary) hypertension: Secondary | ICD-10-CM | POA: Diagnosis not present

## 2024-02-29 DIAGNOSIS — E1129 Type 2 diabetes mellitus with other diabetic kidney complication: Secondary | ICD-10-CM | POA: Diagnosis not present

## 2024-02-29 DIAGNOSIS — E1151 Type 2 diabetes mellitus with diabetic peripheral angiopathy without gangrene: Secondary | ICD-10-CM | POA: Diagnosis not present

## 2024-02-29 DIAGNOSIS — E1142 Type 2 diabetes mellitus with diabetic polyneuropathy: Secondary | ICD-10-CM | POA: Diagnosis not present

## 2024-03-07 ENCOUNTER — Encounter: Payer: Self-pay | Admitting: Podiatry

## 2024-03-07 ENCOUNTER — Ambulatory Visit: Admitting: Podiatry

## 2024-03-07 DIAGNOSIS — E1142 Type 2 diabetes mellitus with diabetic polyneuropathy: Secondary | ICD-10-CM

## 2024-03-07 DIAGNOSIS — E1151 Type 2 diabetes mellitus with diabetic peripheral angiopathy without gangrene: Secondary | ICD-10-CM | POA: Diagnosis not present

## 2024-03-07 DIAGNOSIS — I10 Essential (primary) hypertension: Secondary | ICD-10-CM | POA: Diagnosis not present

## 2024-03-07 DIAGNOSIS — M79675 Pain in left toe(s): Secondary | ICD-10-CM

## 2024-03-07 DIAGNOSIS — N182 Chronic kidney disease, stage 2 (mild): Secondary | ICD-10-CM | POA: Diagnosis not present

## 2024-03-07 DIAGNOSIS — M79674 Pain in right toe(s): Secondary | ICD-10-CM

## 2024-03-07 DIAGNOSIS — B351 Tinea unguium: Secondary | ICD-10-CM

## 2024-03-07 NOTE — Progress Notes (Signed)
 Chief Complaint  Patient presents with   Nail Problem    I'm here for a three month check-up.  I asked him about Diabetes.  He stated, I don't have Diabetes!     HPI: Patient is 73 y.o. male who presents today with concern of thickened elongated and painful nails that are difficult to trim. Requesting to have them trimmed today. Relates burning and tingling in their feet. Patient is diabetic and last A1c was  Lab Results  Component Value Date   HGBA1C 6.2 (H) 05/15/2021   .  He is on metformin  and has neuropathy   PCP:  Aisha Harvey, MD      Allergies  Allergen Reactions   Fenofibrate Other (See Comments)    Caused gallstones   Gemfibrozil Other (See Comments)    Severe muscle pain   Simvastatin Other (See Comments)    Severe muscle pain    Review of systems is negative except as noted in the HPI.  Denies nausea/ vomiting/ fevers/ chills or night sweats.   Denies difficulty breathing, denies calf pain or tenderness  Physical Exam  Physical Exam:  Vascular: DP/PT pulses 2/4 right . CFT <3 seconds. Absent hair growth on digits.       Skin.    Ulceration noted to tip of right hallux has healed. . No erythema edema or purulence noted. Nails 1-5 bilateral are thickened and elongated with subungual debris.    Musculoskeletal: MMT 5/5 bilateral lower extremities in DF, PF, Inversion and Eversion. Deceased ROM in DF of ankle joint. Amputation of right second digit.   Neurological: Sensation intact to light touch. Protective sensation diminished bilateral.    Assessment:    ICD-10-CM   1. Pain due to onychomycosis of toenails of both feet  B35.1    M79.674    M79.675     2. DM type 2 with diabetic peripheral neuropathy (HCC)  E11.42            Plan: -Discussed and educated patient on diabetic foot care, especially with  regards to the vascular, neurological and musculoskeletal systems.  -Stressed the importance of good glycemic control and the detriment of  not  controlling glucose levels in relation to the foot. -Discussed supportive shoes at all times and checking feet regularly.  -Mechanically debrided all nails 1-5 bilateral using sterile nail nipper and filed with dremel without incident  -Answered all patient questions -Patient to return  in 3 months for at risk foot care -Patient advised to call the office if any problems or questions arise in the meantime.      No follow-ups on file.

## 2024-03-13 ENCOUNTER — Telehealth: Payer: Self-pay | Admitting: *Deleted

## 2024-03-13 ENCOUNTER — Ambulatory Visit: Admitting: Podiatry

## 2024-03-13 ENCOUNTER — Encounter: Payer: Self-pay | Admitting: Podiatry

## 2024-03-13 ENCOUNTER — Ambulatory Visit

## 2024-03-13 DIAGNOSIS — M2041 Other hammer toe(s) (acquired), right foot: Secondary | ICD-10-CM

## 2024-03-13 DIAGNOSIS — L97511 Non-pressure chronic ulcer of other part of right foot limited to breakdown of skin: Secondary | ICD-10-CM | POA: Diagnosis not present

## 2024-03-13 DIAGNOSIS — S90931A Unspecified superficial injury of right great toe, initial encounter: Secondary | ICD-10-CM

## 2024-03-13 DIAGNOSIS — S90111A Contusion of right great toe without damage to nail, initial encounter: Secondary | ICD-10-CM | POA: Diagnosis not present

## 2024-03-13 DIAGNOSIS — M2011 Hallux valgus (acquired), right foot: Secondary | ICD-10-CM | POA: Diagnosis not present

## 2024-03-13 DIAGNOSIS — E1142 Type 2 diabetes mellitus with diabetic polyneuropathy: Secondary | ICD-10-CM

## 2024-03-13 DIAGNOSIS — M19071 Primary osteoarthritis, right ankle and foot: Secondary | ICD-10-CM | POA: Diagnosis not present

## 2024-03-13 NOTE — Progress Notes (Signed)
 Chief Complaint  Patient presents with   Toe Injury    I somehow have a split on the end of my toe.  I don't know how it happened.     HPI: Patient is 73 y.o. male who presents for concern of injury in his right great toe.  He is not sure how it happened but noticed the other day.  Relates he noticed/in the distal part of the toe and noticed bleeding in the toe yesterday.  Relates burning and tingling in their feet. Patient is diabetic and last A1c was  Lab Results  Component Value Date   HGBA1C 6.2 (H) 05/15/2021   .  He is on metformin  and has neuropathy   PCP:  Aisha Harvey, MD      Allergies  Allergen Reactions   Fenofibrate Other (See Comments)    Caused gallstones   Gemfibrozil Other (See Comments)    Severe muscle pain   Simvastatin Other (See Comments)    Severe muscle pain    Review of systems is negative except as noted in the HPI.  Denies nausea/ vomiting/ fevers/ chills or night sweats.   Denies difficulty breathing, denies calf pain or tenderness  Physical Exam  Physical Exam:  Vascular: DP/PT pulses 2/4 right . CFT <3 seconds. Absent hair growth on digits.       Skin.   Laceration noted to the tip of the right hallux with underlying hyperkeratosis. . No erythema edema or purulence noted.  Superficial and about 0.5 cm x 0.1 cm x 0.1 cm in diameter.  Nails 1-5 bilateral are thickened and elongated with subungual debris.    Musculoskeletal: MMT 5/5 bilateral lower extremities in DF, PF, Inversion and Eversion. Deceased ROM in DF of ankle joint. Amputation of right second digit.   Neurological: Sensation intact to light touch. Protective sensation diminished bilateral.    Assessment:    ICD-10-CM   1. Skin ulcer of great toe, limited to breakdown of skin, right (HCC)  L97.511     2. DM type 2 with diabetic peripheral neuropathy (HCC)  E11.42             Plan: Ulcer distal right hallux ulceration limited to breakdown of skin -Debridement as  below. -Dressed with Betadine, DSD. -No abx indicated.  -Discussed glucose control and proper protein-rich diet.  -Discussed if any worsening redness, pain, fever or chills to call or may need to report to the emergency room. Patient expressed understanding.    Procedure: Excisional Debridement of Wound Tool: Sharp #312 chisel blade/tissue nipper Type of Debridement: Sharp Excisional Frequency: Every two weeks until appropriately healed.  Dressing is to be changed daily/keeping the wound clean and dry Rationale: Removal of non-viable soft tissue from the wound to promote healing.  Anesthesia: none Pre-Debridement Wound Measurements: Overlying callus Post-Debridement Wound Measurements: 0.5 cm x 0.1 cm x 0.1 cm  Area devitalized tissue removed(nonviable tissue only): 0.5 cm x 0.1 cm.  Blood loss: Minimal (<10cc) Depth of Debridement: limited to skin breakdown Description of tissue removed: Slough and Devitalized Tissue Technique: The wound and the surrounding skin were prepped and draped in usual aseptic fashion.  Aseptic technique was maintained throughout the procedure.  Using #312 blade/tissue nipper sharp debridement of necrotic/nonviable tissue was performed until healthy bleeding wound bed was achieved.  No underlying bone or tendon was exposed during debridement.  The wound was thoroughly irrigated with normal saline solution Wound Progress:  Current Wound Volume: Debridement was performed of the chronic nonhealing  diabetic foot wound on distal right hallux.  Debridement removed 0.5 cm x 0.1 cm of the necrotic tissue and subcutaneous tissue and  purulent drainage was not present. Presence/absence of tissue: Necrotic tissue/nonviable tissue present at the base of the wound.  Sharp debridement was performed to remove the necrotic tissue/nonviable tissue back to viable tissue.  No devitalized/nonviable tissue present postdebridement.  Wound appeared clean and clear of infection No material  in the wound was present that was identified to be inhibiting healing. Dressing: Dry, sterile, compression dressing. Disposition: Patient tolerated procedure well. Patient to return in 2 weeks for follow-up or as listed above.  Return in about 2 weeks (around 03/27/2024) for wound check.      Return in about 2 weeks (around 03/27/2024) for wound check.

## 2024-03-13 NOTE — Telephone Encounter (Signed)
 I called and left a message for the patient asking him to come for his appointment at 1:25 pm because we need to get xrays of his foot.  I asked him to go by imaging prior to coming to us .  I asked him to call if he has any questions.

## 2024-03-30 DIAGNOSIS — E1151 Type 2 diabetes mellitus with diabetic peripheral angiopathy without gangrene: Secondary | ICD-10-CM | POA: Diagnosis not present

## 2024-03-30 DIAGNOSIS — E1129 Type 2 diabetes mellitus with other diabetic kidney complication: Secondary | ICD-10-CM | POA: Diagnosis not present

## 2024-03-30 DIAGNOSIS — N182 Chronic kidney disease, stage 2 (mild): Secondary | ICD-10-CM | POA: Diagnosis not present

## 2024-04-03 ENCOUNTER — Encounter: Payer: Self-pay | Admitting: Podiatry

## 2024-04-03 ENCOUNTER — Ambulatory Visit: Admitting: Podiatry

## 2024-04-03 DIAGNOSIS — L84 Corns and callosities: Secondary | ICD-10-CM

## 2024-04-03 DIAGNOSIS — E1142 Type 2 diabetes mellitus with diabetic polyneuropathy: Secondary | ICD-10-CM | POA: Diagnosis not present

## 2024-04-03 NOTE — Progress Notes (Signed)
 Chief Complaint  Patient presents with   Diabetic Ulcer    It's good.  It cleared up after a couple of days.       HPI: Patient is 73 y.o. male who presents for follow-up of ulcer of right hallux. Relates doing well and cleared up.  Relates burning and tingling in their feet. Patient is diabetic and last A1c was  Lab Results  Component Value Date   HGBA1C 6.2 (H) 05/15/2021   .  He is on metformin  and has neuropathy   PCP:  Aisha Harvey, MD      Allergies  Allergen Reactions   Fenofibrate Other (See Comments)    Caused gallstones   Gemfibrozil Other (See Comments)    Severe muscle pain   Simvastatin Other (See Comments)    Severe muscle pain    Review of systems is negative except as noted in the HPI.  Denies nausea/ vomiting/ fevers/ chills or night sweats.   Denies difficulty breathing, denies calf pain or tenderness  Physical Exam  Physical Exam:  Vascular: DP/PT pulses 2/4 right . CFT <3 seconds. Absent hair growth on digits.       Skin.   Laceration noted to the tip of the right hallux with underlying hyperkeratosis healed.   Nails 1-5 bilateral are thickened and elongated with subungual debris.    Musculoskeletal: MMT 5/5 bilateral lower extremities in DF, PF, Inversion and Eversion. Deceased ROM in DF of ankle joint. Amputation of right second digit.   Neurological: Sensation intact to light touch. Protective sensation diminished bilateral.    Assessment:    ICD-10-CM   1. Skin ulcer of great toe, limited to breakdown of skin, right (HCC)  L97.511     2. DM type 2 with diabetic peripheral neuropathy (HCC)  E11.42              Plan: Ulcer distal right hallux ulceration- healed -Debridement of hyperkeratotic lesion as courtesy with undelying ulceration healed.  -No abx indicated.  -Discussed glucose control and proper protein-rich diet.  -Discussed if any worsening redness, pain, fever or chills to call or may need to report to the emergency  room. Patient expressed understanding.   Return as scheduled for rfc.    No follow-ups on file.      No follow-ups on file.

## 2024-06-06 ENCOUNTER — Ambulatory Visit: Admitting: Podiatry

## 2024-07-04 ENCOUNTER — Ambulatory Visit: Admitting: Podiatry
